# Patient Record
Sex: Female | Born: 1945 | Race: White | Hispanic: No | Smoking: Former smoker
Health system: Southern US, Community
[De-identification: ages and names within clinical notes are randomized; demographics above are authoritative.]

## PROBLEM LIST (undated history)

## (undated) DIAGNOSIS — M1990 Unspecified osteoarthritis, unspecified site: Secondary | ICD-10-CM

## (undated) DIAGNOSIS — I10 Essential (primary) hypertension: Secondary | ICD-10-CM

## (undated) DIAGNOSIS — K219 Gastro-esophageal reflux disease without esophagitis: Secondary | ICD-10-CM

## (undated) DIAGNOSIS — I219 Acute myocardial infarction, unspecified: Secondary | ICD-10-CM

## (undated) DIAGNOSIS — E785 Hyperlipidemia, unspecified: Secondary | ICD-10-CM

## (undated) DIAGNOSIS — M810 Age-related osteoporosis without current pathological fracture: Secondary | ICD-10-CM

---
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
## 1978-11-14 HISTORY — PX: TUBAL LIGATION: SHX77

## 2002-11-14 HISTORY — PX: CATARACT EXTRACTION: SUR2

## 2003-11-15 DIAGNOSIS — K219 Gastro-esophageal reflux disease without esophagitis: Secondary | ICD-10-CM

## 2003-11-15 HISTORY — DX: Gastro-esophageal reflux disease without esophagitis: K21.9

## 2016-02-01 DIAGNOSIS — I219 Acute myocardial infarction, unspecified: Secondary | ICD-10-CM

## 2016-02-01 HISTORY — DX: Acute myocardial infarction, unspecified: I21.9

## 2016-11-14 DIAGNOSIS — M1990 Unspecified osteoarthritis, unspecified site: Secondary | ICD-10-CM

## 2016-11-14 DIAGNOSIS — M810 Age-related osteoporosis without current pathological fracture: Secondary | ICD-10-CM

## 2016-11-14 HISTORY — DX: Unspecified osteoarthritis, unspecified site: M19.90

## 2016-11-14 HISTORY — DX: Age-related osteoporosis without current pathological fracture: M81.0

## 2019-01-02 ENCOUNTER — Other Ambulatory Visit: Payer: Self-pay | Admitting: Internal Medicine

## 2019-01-02 DIAGNOSIS — M810 Age-related osteoporosis without current pathological fracture: Secondary | ICD-10-CM

## 2019-02-25 ENCOUNTER — Other Ambulatory Visit: Payer: Self-pay

## 2019-04-30 ENCOUNTER — Ambulatory Visit: Payer: Medicare Other | Admitting: Physical Therapy

## 2019-05-02 ENCOUNTER — Other Ambulatory Visit: Payer: Self-pay

## 2019-05-02 ENCOUNTER — Encounter: Payer: Self-pay | Admitting: Physical Therapy

## 2019-05-02 ENCOUNTER — Ambulatory Visit: Payer: Medicare Other | Attending: Internal Medicine | Admitting: Physical Therapy

## 2019-05-02 ENCOUNTER — Ambulatory Visit: Payer: Medicare Other | Admitting: Physical Therapy

## 2019-05-02 DIAGNOSIS — M542 Cervicalgia: Secondary | ICD-10-CM

## 2019-05-02 DIAGNOSIS — R293 Abnormal posture: Secondary | ICD-10-CM | POA: Diagnosis present

## 2019-05-02 DIAGNOSIS — M62838 Other muscle spasm: Secondary | ICD-10-CM

## 2019-05-02 DIAGNOSIS — M6281 Muscle weakness (generalized): Secondary | ICD-10-CM
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
## 2019-05-07 ENCOUNTER — Other Ambulatory Visit: Payer: Self-pay

## 2019-05-07 ENCOUNTER — Ambulatory Visit: Payer: Medicare Other | Admitting: Physical Therapy

## 2019-05-07 ENCOUNTER — Encounter: Payer: Self-pay | Admitting: Physical Therapy

## 2019-05-07 DIAGNOSIS — M542 Cervicalgia: Secondary | ICD-10-CM | POA: Diagnosis not present

## 2019-05-07 DIAGNOSIS — R293 Abnormal posture: Secondary | ICD-10-CM

## 2019-05-07 DIAGNOSIS — M62838 Other muscle spasm: Secondary | ICD-10-CM

## 2019-05-07 DIAGNOSIS — M6281 Muscle weakness (generalized): Secondary | ICD-10-CM

## 2019-05-07 NOTE — Therapy (Signed)
[facility] [address], [place], [address] Phone: [phone]   Fax:  [phone]  Physical Therapy Treatment  Patient Details  Name: Anna Castaneda MRN: [id number] Date of Birth: 1946/09/27 Referring Provider (PT):  Barbette Reichmann, MD   Encounter Date: 05/07/2019  PT End of Session - 05/07/19 1531    Visit Number  2    Number of Visits  12    Date for PT Re-Evaluation  06/13/19    Authorization Type  Medicare authorization period from 05/02/2019    Authorization Time Period  Current Cert period: 05/02/2019 - 06/13/2019 (latest PN: IE 04/30/2019)    Authorization - Visit Number  2    Authorization - Number of Visits  10    PT Start Time  1433    PT Stop Time  1515    PT Time Calculation (min)  42 min    Activity Tolerance  Patient tolerated treatment well    Behavior During Therapy  [facility] for tasks assessed/performed       Past Medical History:  Diagnosis Date  . Arthritis 2018  . GERD (gastroesophageal reflux disease) 2005  . Myocardial infarction (HCC) 02/01/2016   Broken Heart Syndrome  . Osteoporosis 2018    Past Surgical History:  Procedure Laterality Date  . CATARACT EXTRACTION Bilateral 2004  . TONSILLECTOMY Bilateral 1954  . TUBAL LIGATION  1980    There were no vitals filed for this visit.  Subjective Assessment - 05/07/19 1527    Subjective  Patient states she is feeling well today and reports 2/10 pain at bilateral suboccipitial region upon arrival. She states she has been unable to see the chiropractor recently becaue the clinic is closed for a deep cleaning and she suspects there may have been someone there with COVID19 but this is unconfirmed. She states she saw her new cardiologist since last session who wants to do a stress test and echocardiogram since she is a new patient for him. She report she missed her last personal training session because she was not feeling well but she is  looking forward to going back tomorrow.    Pertinent History  Patient is a 73 y.o. female who presents to outpatient physical therapy with a referral for medical diagnosis neck pain and chronic low back pain. This patient's chief complaints consist of neck pain and thoracic spine pain, multi-joint instability and weaknes leading to the following functional deficits: difficulty sleeping, bending forward, reading, completing ADLs, and IADLs, and caring for a pet. 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    Limitations  Lifting;House hold activities;Other (comment);Sitting   difficulty sleeping, bending forward, reading, completing ADLs, and IADLs, and caring for a pet   Diagnostic tests  None except for many years ago possibly at chiropractic office. Does not remember anything significant.    Patient Stated Goals  get stronger and relieve pain    Currently in Pain?  Yes    Pain Score  2     Pain Location  Neck    Pain Orientation  Left;Right;Mid;Upper   bilateral suboccipitial region   Pain Descriptors / Indicators  Aching    Pain Type  Chronic pain    Pain Onset  More than a month ago  TREATMENT:  Denies history of spinal surgery, long term steroid use, or latex sensitivity.  Confirms Osteoporosis  Therapeutic exercise:to centralize symptoms and improve ROM, strength, muscular endurance, and activity tolerance required for successful completion of functional activities.  - supine chin tucks with towel roll placed behind neck for pt's comfort. X 20 with cuing to relax SCM and nod slightly to target deep neck flexors.  - Sidelying open book (thoracic rotation) to improve thoracic, shoulder girdle, and upper  trunk mobility. Required instruction for technique and cuing to achieve end range as tolerated, hold time, and breathing technique. 5 second holds. x20 each side. Pt reported she felt this may be better to do prior to manual and caused some stress on her neck. Neck was supported by towel roll and pillow.  - seated cervical retraction against yellow theraband x 10 to improve cervical spine strength. Patient reported she felt it stirred up her dysfunction and displaced her neck some. Reported no increased pain. Patient with difficulty retracting instead of extending cervical spine.  - seated retraction and flexion cervical spine stretch. 2x15 seconds plus additional time including with hands on cuing from clinician to achieve correct form. To improve upper cervical spine flexion and decrease muscle tension. Patient struggled to perform steady pressure in correct direction.  - Education on diagnosis, prognosis, POC, anatomy and physiology of current condition.     Manual therapy: to reduce pain and tissue tension, improve range of motion, neuromodulation, in order to promote improved ability to complete functional activities. - STM with trigger point relase to posterior cervical spine with focus on B SCM and suboccipital region with patient reporting significant relief during and following.  Gentle manual distraction intermittently throughout. Completed at start of session.   HOME EXERCISE PROGRAM HHome exercise Program [email or web address] Created by Norton BlizzardSara Jathniel Smeltzer, PT, DPT Jun 23rd, 2020 View videos at [email or web address] Sustained cervical flexion/retraction in sitting Maintain the neck retracted with the web-space of one hand while maintaining neck bend with the other hand. Maintain the position for 15-30 seconds at a time. Repeat 3 Times Hold 30 Seconds Complete 1 Set Perform 2 Times a Day Chin Tuck Roll up a towel and place it below your neck. Your ear should be in line with your shoulder, so you may place a  pillow under your head if needed. Without lifting your head, or pushing your head back against the bed, nod your head, and pull your chin down, as if nodding "yes". Relax and return to the starting position. This is a subtle motion, so be sure not to strain your neck muscles. Repeat 10 Times Hold 5 Seconds Complete 3 Sets Perform 2 Times a Day    PT Education - 05/07/19 1532    Education Details  Exercise purpose/form. Self management techniques. Education on diagnosis, prognosis, POC, anatomy and physiology of current condition Education on HEP    Person(s) Educated  Patient    Methods  Explanation;Demonstration;Tactile cues;Verbal cues;Handout    Comprehension  Verbalized understanding;Returned demonstration;Verbal cues required;Tactile cues required       PT Short Term Goals - 05/02/19 1906      PT SHORT TERM GOAL #1   Title  Be independent with initial home exercise program for self-management of symptoms.    Baseline  Initial HEP provided at IE (05/02/2019):    Time  2    Period  Weeks    Status  New    Target Date  05/16/19        PT  Long Term Goals - 05/02/19 1907      PT LONG TERM GOAL #1   Title  Be independent with a long-term home exercise program for self-management of symptoms.    Baseline  Initial HEP provided at initial eval (05/02/2019);    Time  6    Period  Weeks    Status  New    Target Date  06/13/19      PT LONG TERM GOAL #2   Title  Reduce pain with functional activities to equal or less than 1/10 to allow patient to complete usual activities including ADLs, IADLs, and social engagement with less difficulty.    Baseline  4-5/10 (05/02/2019);    Time  6    Period  Weeks    Status  New    Target Date  06/13/19      PT LONG TERM GOAL #3   Title  Have full cervical spine AROM with no compensations or increase in pain in all planes except intermittent end range discomfort to allow patient to complete valued activities with less difficulty.     Baseline  see objective exam (05/02/2019);    Time  6    Period  Weeks    Status  New    Target Date  06/13/19      PT LONG TERM GOAL #4   Title  Complete community, work and/or recreational activities without limitation due to current condition.    Baseline  difficulty sleeping, bending forward, reading, completing ADLs, and IADLs, gardening, volunteering, and caring for a pet (05/02/2019);    Time  6    Period  Weeks    Status  New    Target Date  06/13/19      PT LONG TERM GOAL #5   Title  Patient will demonstrate improved ability to perform functional tasks as exhibited by at least 10 point improvement in FOTO score    Baseline  to be measured visit 2 (05/02/2019);    Time  6    Period  Weeks    Status  New    Target Date  06/13/19            Plan - 05/07/19 1531    Clinical Impression Statement  Pt tolerated treatment well overall. She felt some temporary relief with manual therapy. She demonstrated poor carryover in HEP technique fron last visit and was re-educated on form for chin tucks. Noted to have muscular shortening in many posterior cervical muscles. Was ameniable to removing towel roll behind neck at times.  Pt was able to complete all exercises with minimal to no lasting increase in pain but stated she felt her neck was "out" at end of session. Reports reduction of suboccipital pain from 2 to 1 but had onset of pain at bilateral proximal attachment of SCM by end of session. Patient seemed satisfied with session but stated she would not know how she responded to it until later in the day. She repeatedly suggested exercises may be better prior to manual after she started performing exercises. Pt required multimodal cuing for proper technique and to facilitate improved neuromuscular control, strength, range of motion, and functional ability resulting in improved performance and form. Patient would benefit from continued physical therapy to address remaining impairments and  functional limitations to work towards stated goals and return to PLOF or maximal functional independence.    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   Examination-Participation Restrictions  Interpersonal Relationship;Driving;Yard Work;Cleaning;Volunteer;Laundry;Church;Community Activity    Stability/Clinical Decision Making  Evolving/Moderate complexity    Rehab Potential  Good    PT Frequency  2x / week    PT Duration  6 weeks    PT Treatment/Interventions  ADLs/Self Care Home Management;Cryotherapy;Electrical Stimulation;Moist Heat;Therapeutic activities;Therapeutic exercise;Neuromuscular re-education;Patient/family education;Manual techniques;Dry needling;Passive range of motion;Joint Manipulations;Other (comment);Spinal Manipulations   joint mobilizations grades I-IV   PT Next Visit Plan  manual therapy, stretches for upper cervical spine, deep cervical neck flexor strengtheing    PT Home Exercise Plan  - chin tucks for deep cervical spine strengthening. - cervical retraction/flexion stretch    Consulted and Agree with Plan of Care  Patient       Patient will benefit from skilled therapeutic intervention in order to improve the following deficits and impairments:  Hypomobility, Increased muscle spasms, Impaired perceived functional ability, Improper body mechanics, Postural dysfunction, Impaired flexibility, Increased fascial  restricitons, Decreased strength, Decreased coordination, Decreased activity tolerance, Decreased range of motion, Pain  Visit Diagnosis: 1. Cervicalgia   2. Abnormal posture   3. Other muscle spasm   4. Muscle weakness (generalized)        Problem List There are no active problems to display for this patient.   Everlean Alstrom. Graylon Good, PT, DPT 05/07/19, 3:34 PM  [facility] [address] [place], [address] Phone: [phone]   Fax:  [phone]  Name: Anna Castaneda MRN: [id number] Date of Birth: January 26, 1946

## 2019-05-09 ENCOUNTER — Ambulatory Visit: Payer: Medicare Other | Admitting: Physical Therapy

## 2019-05-14 ENCOUNTER — Ambulatory Visit: Payer: Medicare Other | Admitting: Physical Therapy

## 2019-05-14 ENCOUNTER — Other Ambulatory Visit: Payer: Self-pay | Admitting: Obstetrics and Gynecology

## 2019-05-14 ENCOUNTER — Other Ambulatory Visit: Payer: Self-pay

## 2019-05-14 DIAGNOSIS — Z1231 Encounter for screening mammogram for malignant neoplasm of breast: Secondary | ICD-10-CM

## 2019-05-14 NOTE — Therapy (Signed)
[facility] [address] [place], [address] Phone: [phone]   Fax:  [phone]  Physical Therapy Treatment  Patient Details  Name: Anna Castaneda MRN: [id number] Date of Birth: 11-30-1945 Referring Provider (PT):  Tracie Harrier, MD   Encounter Date: 05/14/2019    Past Medical History:  Diagnosis Date  . Arthritis 2018  . GERD (gastroesophageal reflux disease) 2005  . Myocardial infarction ([place]) 02/01/2016   Broken Heart Syndrome  . Osteoporosis 2018    Past Surgical History:  Procedure Laterality Date  . CATARACT EXTRACTION Bilateral 2004  . TONSILLECTOMY Bilateral 1954  . TUBAL LIGATION  1980    There were no vitals filed for this visit.    Patient was arrived in system but not seen today.                           PT Short Term Goals - 05/02/19 1906      PT SHORT TERM GOAL #1   Title  Be independent with initial home exercise program for self-management of symptoms.    Baseline  Initial HEP provided at IE (05/02/2019):    Time  2    Period  Weeks    Status  New    Target Date  05/16/19        PT Long Term Goals - 05/02/19 1907      PT LONG TERM GOAL #1   Title  Be independent with a long-term home exercise program for self-management of symptoms.    Baseline  Initial HEP provided at initial eval (05/02/2019);    Time  6    Period  Weeks    Status  New    Target Date  06/13/19      PT LONG TERM GOAL #2   Title  Reduce pain with functional activities to equal or less than 1/10 to allow patient to complete usual activities including ADLs, IADLs, and social engagement with less difficulty.    Baseline  4-5/10 (05/02/2019);    Time  6    Period  Weeks    Status  New    Target Date  06/13/19      PT LONG TERM GOAL #3   Title  Have full cervical spine AROM with no compensations or increase in pain in all planes except intermittent end range discomfort  to allow patient to complete valued activities with less difficulty.    Baseline  see objective exam (05/02/2019);    Time  6    Period  Weeks    Status  New    Target Date  06/13/19      PT LONG TERM GOAL #4   Title  Complete community, work and/or recreational activities without limitation due to current condition.    Baseline  difficulty sleeping, bending forward, reading, completing ADLs, and IADLs, gardening, volunteering, and caring for a pet (05/02/2019);    Time  6    Period  Weeks    Status  New    Target Date  06/13/19      PT LONG TERM GOAL #5   Title  Patient will demonstrate improved ability to perform functional tasks as exhibited by at least 10 point improvement in FOTO score    Baseline  to be measured visit 2 (05/02/2019);    Time  6    Period  Weeks    Status  New  Target Date  06/13/19              Patient will benefit from skilled therapeutic intervention in order to improve the following deficits and impairments:     Visit Diagnosis: No diagnosis found.     Problem List There are no active problems to display for this patient.   Everett Ricciardelli 05/14/2019, 3:06 PM  [facility] [address] [place], [address] Phone: [phone]   Fax:  [phone]  Name: Anna Castaneda MRN: [id number] Date of Birth: [birth year]

## 2019-05-15 ENCOUNTER — Encounter: Payer: Self-pay | Admitting: Physical Therapy

## 2019-05-16 ENCOUNTER — Ambulatory Visit: Payer: Medicare Other | Admitting: Physical Therapy

## 2019-05-21 ENCOUNTER — Emergency Department (HOSPITAL_COMMUNITY): Payer: Medicare Other

## 2019-05-21 ENCOUNTER — Inpatient Hospital Stay (HOSPITAL_COMMUNITY)
Admission: EM | Admit: 2019-05-21 | Discharge: 2019-05-24 | DRG: 100 | Disposition: A | Discharge status: Skilled Nursing Facility | Payer: Medicare Other | Attending: Neurology | Admitting: Neurology

## 2019-05-21 ENCOUNTER — Other Ambulatory Visit: Payer: Self-pay

## 2019-05-21 ENCOUNTER — Ambulatory Visit: Payer: Medicare Other | Admitting: Physical Therapy

## 2019-05-21 ENCOUNTER — Inpatient Hospital Stay (HOSPITAL_COMMUNITY): Payer: Medicare Other

## 2019-05-21 DIAGNOSIS — R29810 Facial weakness: Secondary | ICD-10-CM | POA: Diagnosis present

## 2019-05-21 DIAGNOSIS — R569 Unspecified convulsions: Secondary | ICD-10-CM

## 2019-05-21 DIAGNOSIS — R29705 NIHSS score 5: Secondary | ICD-10-CM | POA: Diagnosis present

## 2019-05-21 DIAGNOSIS — K5900 Constipation, unspecified: Secondary | ICD-10-CM | POA: Diagnosis present

## 2019-05-21 DIAGNOSIS — S7001XA Contusion of right hip, initial encounter: Secondary | ICD-10-CM | POA: Diagnosis present

## 2019-05-21 DIAGNOSIS — I639 Cerebral infarction, unspecified: Secondary | ICD-10-CM | POA: Diagnosis not present

## 2019-05-21 DIAGNOSIS — K219 Gastro-esophageal reflux disease without esophagitis: Secondary | ICD-10-CM | POA: Diagnosis present

## 2019-05-21 DIAGNOSIS — W1830XA Fall on same level, unspecified, initial encounter: Secondary | ICD-10-CM | POA: Diagnosis present

## 2019-05-21 DIAGNOSIS — W101XXA Fall (on)(from) sidewalk curb, initial encounter: Secondary | ICD-10-CM

## 2019-05-21 DIAGNOSIS — G9341 Metabolic encephalopathy: Secondary | ICD-10-CM | POA: Diagnosis present

## 2019-05-21 DIAGNOSIS — W19XXXA Unspecified fall, initial encounter: Secondary | ICD-10-CM | POA: Diagnosis present

## 2019-05-21 DIAGNOSIS — E876 Hypokalemia: Secondary | ICD-10-CM | POA: Diagnosis present

## 2019-05-21 DIAGNOSIS — I429 Cardiomyopathy, unspecified: Secondary | ICD-10-CM | POA: Diagnosis present

## 2019-05-21 DIAGNOSIS — Z9842 Cataract extraction status, left eye: Secondary | ICD-10-CM

## 2019-05-21 DIAGNOSIS — E785 Hyperlipidemia, unspecified: Secondary | ICD-10-CM | POA: Diagnosis present

## 2019-05-21 DIAGNOSIS — S50312A Abrasion of left elbow, initial encounter: Secondary | ICD-10-CM | POA: Diagnosis present

## 2019-05-21 DIAGNOSIS — E878 Other disorders of electrolyte and fluid balance, not elsewhere classified: Secondary | ICD-10-CM | POA: Diagnosis present

## 2019-05-21 DIAGNOSIS — E871 Hypo-osmolality and hyponatremia: Secondary | ICD-10-CM | POA: Diagnosis present

## 2019-05-21 DIAGNOSIS — E861 Hypovolemia: Secondary | ICD-10-CM | POA: Diagnosis present

## 2019-05-21 DIAGNOSIS — D62 Acute posthemorrhagic anemia: Secondary | ICD-10-CM | POA: Diagnosis present

## 2019-05-21 DIAGNOSIS — Z9841 Cataract extraction status, right eye: Secondary | ICD-10-CM

## 2019-05-21 DIAGNOSIS — R578 Other shock: Secondary | ICD-10-CM | POA: Diagnosis not present

## 2019-05-21 DIAGNOSIS — Z87891 Personal history of nicotine dependence: Secondary | ICD-10-CM | POA: Diagnosis not present

## 2019-05-21 DIAGNOSIS — I6300 Cerebral infarction due to thrombosis of unspecified precerebral artery: Secondary | ICD-10-CM | POA: Diagnosis not present

## 2019-05-21 DIAGNOSIS — M810 Age-related osteoporosis without current pathological fracture: Secondary | ICD-10-CM | POA: Diagnosis present

## 2019-05-21 DIAGNOSIS — I351 Nonrheumatic aortic (valve) insufficiency: Secondary | ICD-10-CM | POA: Diagnosis not present

## 2019-05-21 DIAGNOSIS — I5181 Takotsubo syndrome: Secondary | ICD-10-CM | POA: Diagnosis present

## 2019-05-21 DIAGNOSIS — I2510 Atherosclerotic heart disease of native coronary artery without angina pectoris: Secondary | ICD-10-CM | POA: Diagnosis present

## 2019-05-21 DIAGNOSIS — I252 Old myocardial infarction: Secondary | ICD-10-CM

## 2019-05-21 DIAGNOSIS — S50311A Abrasion of right elbow, initial encounter: Secondary | ICD-10-CM | POA: Diagnosis present

## 2019-05-21 DIAGNOSIS — I10 Essential (primary) hypertension: Secondary | ICD-10-CM | POA: Diagnosis present

## 2019-05-21 DIAGNOSIS — S7011XS Contusion of right thigh, sequela: Secondary | ICD-10-CM

## 2019-05-21 DIAGNOSIS — S7012XS Contusion of left thigh, sequela: Secondary | ICD-10-CM

## 2019-05-21 DIAGNOSIS — Z1159 Encounter for screening for other viral diseases: Secondary | ICD-10-CM

## 2019-05-21 DIAGNOSIS — S8001XA Contusion of right knee, initial encounter: Secondary | ICD-10-CM | POA: Diagnosis present

## 2019-05-21 DIAGNOSIS — Z79899 Other long term (current) drug therapy: Secondary | ICD-10-CM

## 2019-05-21 DIAGNOSIS — Z7982 Long term (current) use of aspirin: Secondary | ICD-10-CM

## 2019-05-21 DIAGNOSIS — R2990 Unspecified symptoms and signs involving the nervous system: Secondary | ICD-10-CM | POA: Diagnosis present

## 2019-05-21 LAB — DIFFERENTIAL
Abs Immature Granulocytes: 0.05 10*3/uL (ref 0.00–0.07)
Basophils Absolute: 0 10*3/uL (ref 0.0–0.1)
Basophils Relative: 0 %
Eosinophils Absolute: 0 10*3/uL (ref 0.0–0.5)
Eosinophils Relative: 0 %
Immature Granulocytes: 1 %
Lymphocytes Relative: 10 %
Lymphs Abs: 0.9 10*3/uL (ref 0.7–4.0)
Monocytes Absolute: 0.7 10*3/uL (ref 0.1–1.0)
Monocytes Relative: 8 %
Neutro Abs: 7.2 10*3/uL (ref 1.7–7.7)
Neutrophils Relative %: 81 %

## 2019-05-21 LAB — COMPREHENSIVE METABOLIC PANEL
ALT: 24 U/L (ref 0–44)
ALT: 21 U/L (ref 0–44)
AST: 35 U/L (ref 15–41)
AST: 29 U/L (ref 15–41)
Albumin: 4.4 g/dL (ref 3.5–5.0)
Albumin: 3.5 g/dL (ref 3.5–5.0)
Alkaline Phosphatase: 48 U/L (ref 38–126)
Alkaline Phosphatase: 38 U/L (ref 38–126)
Anion gap: 14 (ref 5–15)
Anion gap: 10 (ref 5–15)
BUN: 7 mg/dL — ABNORMAL LOW (ref 8–23)
BUN: 6 mg/dL — ABNORMAL LOW (ref 8–23)
CO2: 18 mmol/L — ABNORMAL LOW (ref 22–32)
CO2: 19 mmol/L — ABNORMAL LOW (ref 22–32)
Calcium: 8.1 mg/dL — ABNORMAL LOW (ref 8.9–10.3)
Calcium: 7.4 mg/dL — ABNORMAL LOW (ref 8.9–10.3)
Chloride: 76 mmol/L — ABNORMAL LOW (ref 98–111)
Chloride: 83 mmol/L — ABNORMAL LOW (ref 98–111)
Creatinine, Ser: 0.62 mg/dL (ref 0.44–1.00)
Creatinine, Ser: 0.56 mg/dL (ref 0.44–1.00)
GFR calc Af Amer: >60 mL/min (ref >60)
GFR calc Af Amer: >60 mL/min (ref >60)
GFR calc non Af Amer: >60 mL/min (ref >60)
GFR calc non Af Amer: >60 mL/min (ref >60)
Glucose, Bld: 163 mg/dL — ABNORMAL HIGH (ref 70–99)
Glucose, Bld: 137 mg/dL — ABNORMAL HIGH (ref 70–99)
Potassium: 4.2 mmol/L (ref 3.5–5.1)
Potassium: 3.3 mmol/L — ABNORMAL LOW (ref 3.5–5.1)
Sodium: 108 mmol/L — CL (ref 135–145)
Sodium: 112 mmol/L — CL (ref 135–145)
Total Bilirubin: 1.2 mg/dL (ref 0.3–1.2)
Total Bilirubin: 1.2 mg/dL (ref 0.3–1.2)
Total Protein: 6.9 g/dL (ref 6.5–8.1)
Total Protein: 5.5 g/dL — ABNORMAL LOW (ref 6.5–8.1)

## 2019-05-21 LAB — BASIC METABOLIC PANEL
Anion gap: 10 (ref 5–15)
BUN: 6 mg/dL — ABNORMAL LOW (ref 8–23)
CO2: 18 mmol/L — ABNORMAL LOW (ref 22–32)
Calcium: 7.4 mg/dL — ABNORMAL LOW (ref 8.9–10.3)
Chloride: 86 mmol/L — ABNORMAL LOW (ref 98–111)
Creatinine, Ser: 0.58 mg/dL (ref 0.44–1.00)
GFR calc Af Amer: >60 mL/min (ref >60)
GFR calc non Af Amer: >60 mL/min (ref >60)
Glucose, Bld: 121 mg/dL — ABNORMAL HIGH (ref 70–99)
Potassium: 3.7 mmol/L (ref 3.5–5.1)
Sodium: 114 mmol/L — CL (ref 135–145)

## 2019-05-21 LAB — PROTIME-INR
INR: 1.1 (ref 0.8–1.2)
Prothrombin Time: 14.4 seconds (ref 11.4–15.2)

## 2019-05-21 LAB — I-STAT CHEM 8, ED
BUN: 6 mg/dL — ABNORMAL LOW (ref 8–23)
Calcium, Ion: 0.88 mmol/L — CL (ref 1.15–1.40)
Chloride: 75 mmol/L — ABNORMAL LOW (ref 98–111)
Creatinine, Ser: 0.5 mg/dL (ref 0.44–1.00)
Glucose, Bld: 164 mg/dL — ABNORMAL HIGH (ref 70–99)
HCT: 42 % (ref 36.0–46.0)
Hemoglobin: 14.3 g/dL (ref 12.0–15.0)
Potassium: 4.2 mmol/L (ref 3.5–5.1)
Sodium: 104 mmol/L — CL (ref 135–145)
TCO2: 18 mmol/L — ABNORMAL LOW (ref 22–32)

## 2019-05-21 LAB — CBC
HCT: 36.4 % (ref 36.0–46.0)
Hemoglobin: 13.2 g/dL (ref 12.0–15.0)
MCH: 32 pg (ref 26.0–34.0)
MCHC: 36.3 g/dL — ABNORMAL HIGH (ref 30.0–36.0)
MCV: 88.1 fL (ref 80.0–100.0)
Platelets: 375 10*3/uL (ref 150–400)
RBC: 4.13 MIL/uL (ref 3.87–5.11)
RDW: 11.9 % (ref 11.5–15.5)
WBC: 8.8 10*3/uL (ref 4.0–10.5)
nRBC: 0 % (ref 0.0–0.2)

## 2019-05-21 LAB — CBG MONITORING, ED: Glucose-Capillary: 169 mg/dL — ABNORMAL HIGH (ref 70–99)

## 2019-05-21 LAB — ABO/RH: ABO/RH(D): AB POS

## 2019-05-21 LAB — SARS CORONAVIRUS 2 BY RT PCR (HOSPITAL ORDER, PERFORMED IN ~~LOC~~ HOSPITAL LAB): SARS Coronavirus 2: NEGATIVE

## 2019-05-21 LAB — HEMOGLOBIN AND HEMATOCRIT, BLOOD
HCT: 30.9 % — ABNORMAL LOW (ref 36.0–46.0)
Hemoglobin: 11.6 g/dL — ABNORMAL LOW (ref 12.0–15.0)

## 2019-05-21 LAB — APTT: aPTT: 29 seconds (ref 24–36)

## 2019-05-21 LAB — TSH: TSH: 1.368 u[IU]/mL (ref 0.350–4.500)

## 2019-05-21 LAB — MAGNESIUM: Magnesium: 2.2 mg/dL (ref 1.7–2.4)

## 2019-05-21 LAB — PREPARE RBC (CROSSMATCH)

## 2019-05-21 LAB — FIBRINOGEN: Fibrinogen: 206 mg/dL — ABNORMAL LOW (ref 210–475)

## 2019-05-21 MED ORDER — TRANEXAMIC ACID-NACL 1000-0.7 MG/100ML-% IV SOLN
1000.0000 mg | Freq: Once | INTRAVENOUS | Status: AC
  Administered 2019-05-21: 1000 mg via INTRAVENOUS
  Filled 2019-05-21: qty 100

## 2019-05-21 MED ORDER — TRANEXAMIC ACID-NACL 1000-0.7 MG/100ML-% IV SOLN
1000.0000 mg | INTRAVENOUS | Status: DC
  Filled 2019-05-21: qty 100

## 2019-05-21 MED ORDER — LABETALOL HCL 5 MG/ML IV SOLN: 20.0000 mg | Freq: Once | INTRAVENOUS | Status: DC

## 2019-05-21 MED ORDER — STROKE: EARLY STAGES OF RECOVERY BOOK
Freq: Once | Status: DC
  Filled 2019-05-21: qty 1

## 2019-05-21 MED ORDER — CLEVIDIPINE BUTYRATE 0.5 MG/ML IV EMUL: 0.0000 mg/h | INTRAVENOUS | Status: DC

## 2019-05-21 MED ORDER — ALTEPLASE (STROKE) FULL DOSE INFUSION
0.9000 mg/kg | Freq: Once | INTRAVENOUS | Status: AC
  Administered 2019-05-21: 50.2 mg via INTRAVENOUS
  Filled 2019-05-21: qty 50.2

## 2019-05-21 MED ORDER — ACETAMINOPHEN 325 MG PO TABS: 650.0000 mg | ORAL_TABLET | ORAL | Status: DC | PRN

## 2019-05-21 MED ORDER — SODIUM CHLORIDE 0.9 % IV SOLN: Freq: Once | INTRAVENOUS | Status: DC

## 2019-05-21 MED ORDER — SODIUM CHLORIDE 0.9 % IV SOLN
INTRAVENOUS | Status: DC
  Administered 2019-05-21: 16:00:00 via INTRAVENOUS

## 2019-05-21 MED ORDER — SODIUM CHLORIDE 0.9 % IV BOLUS
1000.0000 mL | Freq: Once | INTRAVENOUS | Status: AC
  Administered 2019-05-21: 1000 mL via INTRAVENOUS

## 2019-05-21 MED ORDER — ACETAMINOPHEN 650 MG RE SUPP: 650.0000 mg | RECTAL | Status: DC | PRN

## 2019-05-21 MED ORDER — ACETAMINOPHEN 160 MG/5ML PO SOLN: 650.0000 mg | ORAL | Status: DC | PRN

## 2019-05-21 MED ORDER — PANTOPRAZOLE SODIUM 40 MG IV SOLR
40.0000 mg | Freq: Every day | INTRAVENOUS | Status: DC
  Administered 2019-05-22: 40 mg via INTRAVENOUS
  Filled 2019-05-21: qty 40

## 2019-05-21 MED ORDER — SENNOSIDES-DOCUSATE SODIUM 8.6-50 MG PO TABS: 1.0000 | ORAL_TABLET | Freq: Every evening | ORAL | Status: DC | PRN

## 2019-05-21 MED ORDER — HYDRALAZINE HCL 20 MG/ML IJ SOLN: 20.0000 mg | Freq: Three times a day (TID) | INTRAMUSCULAR | Status: DC | PRN

## 2019-05-21 MED ORDER — SODIUM CHLORIDE 0.9% IV SOLUTION: Freq: Once | INTRAVENOUS | Status: DC

## 2019-05-21 MED ORDER — IOHEXOL 350 MG/ML SOLN
100.0000 mL | Freq: Once | INTRAVENOUS | Status: AC | PRN
  Administered 2019-05-21: 75 mL via INTRAVENOUS

## 2019-05-21 MED ORDER — SODIUM CHLORIDE 0.9 % IV SOLN
50.0000 mL | Freq: Once | INTRAVENOUS | Status: AC
  Administered 2019-05-21: 50 mL via INTRAVENOUS

## 2019-05-21 NOTE — Code Documentation (Signed)
73 yo female coming from a [facility] facility with hx of arthritis, MI, and GERD. Pt was LKW at 1355 when she talked with staff. Pt was shortly after found down at the mailbox where she was noted to have right sided weakness and trouble speaking. [facility] called and activated a Code Stroke. Stroke Team met patient upon arrival NIHSS 4 due to aphasia, right sided facial droop, and dysarthria. RIght Sided weakness had resolved upon patient's arrival. CT Head negative for hemorrhage. Patient tPA candidate and tPA given. 45.2 mg given over 60 minutes with 5 mg bolus given over one minute. 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 Upon reassessment, pt has NIHSS 1 due to mild aphasia. Pt is not IR candidate due to low NIHSS. See Stroke Timeline and NIHSS Flowsheet for details. Pt brought back to ED and placed on cardiac monitor. To be admitted to [facility]. Handoff given to Sarah,RN.

## 2019-05-21 NOTE — Progress Notes (Signed)
CRITICAL VALUE ALERT  Critical Value: Na+ 114  Date & Time Notied: 2305 05/21/19   Provider Notified: Warren Lacy   Orders Received/Actions taken: Change rate of NS from 75 mL to 30 mL

## 2019-05-21 NOTE — Progress Notes (Addendum)
Was called around 7 PM by neuro ICU nurse that patient has large hematoma over her right thigh as well as a small hematoma behind her right knee.  Upon assessment patient was hypotensive with blood pressures as low as 79X systolic.  Large hematoma that was expanding in size per the nurse.    On examination, patient is awake alert and oriented.  TPA reversal order set initiated immediately.  TXA was administered.  Follow-up fibrinogen level borderline low 206 (reference range 2 10-4 75). Patient is hemodynamically stable and size of hematoma has not increased after administration of TXA.  Will defer cryoprecipitate transfusion.    Will monitor H&H dropped from 14-11.  CCM called as patient initially hypotensive, appreciate their assistance.   I spent 35 minutes of critical care time in this neurologically ill patient.  Patient is high risk for deterioration from complications of TPA, and patient high risk of going into hypovolemic shock.  I spoke with patient's friend who is the medical advocate.  Discussed goals of care, she is a full code.  Patient also has a niece, was not able to get in touch with her last night.

## 2019-05-21 NOTE — ED Triage Notes (Signed)
Pt here from independent living facility where she was found down at her mailbox. Was seen normal 15 mins prior to being found. Pt has expressive and receptive aphasia and slight facial droop.
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
## 2019-05-21 NOTE — Progress Notes (Signed)
[facility] Progress Note Patient Name: Anna Castaneda DOB: [birth year] MRN: [id number]   Date of Service  05/21/2019  HPI/Events of Note  Hyponatremia - Na+ = 108 --> 114. In just under 8 hours.   eICU Interventions  Will order: 1. Decrease 0.9 NaCl IV infusion to 30 mL/hour.      Intervention Category Major Interventions: Electrolyte abnormality - evaluation and management  [name] Eugene 05/21/2019, 11:15 PM

## 2019-05-21 NOTE — Progress Notes (Signed)
Pharmacist Code Stroke Response  Notified to mix tPA at 1450 by Dr. Leonie Man Delivered tPA to RN at 1455  tPA dose = 5mg  bolus over 1 minute followed by 45.2mg  for a total dose of 50.2mg  over 1 hour  Issues/delays encountered (if applicable):None   Corinda Gubler 05/21/19 3:07 PM

## 2019-05-21 NOTE — Progress Notes (Signed)
Upon initial assessment at presentation to unit, patient presented with large fist size hemaotoma on outer right hip and golf ball size on right outer knee.  MD notified. Hemotoma's notably larger within minutes. MD at bedside and upon assessment- TPX ordered for reversal and administered. Follow up Fibrinogen shows stable levels, hematomas stable, HCT stable.  RN will continue to monitor.   CRITICAL VALUE ALERT  Critical Value:  Na+ 112  Date & Time Notied:  05/21/19 at 2100.   Provider Notified: Georgann Housekeeper, NP.   Orders Received/Actions taken: Redraw timed Na+ levels q2 hours.

## 2019-05-21 NOTE — ED Notes (Signed)
ED TO INPATIENT HANDOFF REPORT  ED Nurse Name and Phone #: Maralyn SagoSarah [phone]  S Name/Age/Gender Anna PottJanice Castaneda 73 y.o. female Room/Bed: 032C/032C  Code Status   Code Status: Full Code  Home/SNF/Other independant living facility Patient oriented to: self, place, time and situation Is this baseline? Yes   Triage Complete: Triage complete  Chief Complaint CODE STROKE  Triage Note Pt here from independent living facility where she was found down at her mailbox. Was seen normal 15 mins prior to being found. Pt has expressive and receptive aphasia and slight facial droop.    Allergies Not on File  Level of Care/Admitting Diagnosis ED Disposition    ED Disposition Condition Comment   Admit  [facility] [[id number]]  Level of Care: ICU [6]  Covid Evaluation: N/A  Diagnosis: Stroke [facility](HCC) [[id number]]) [[id number]]  Admitting Physician: Gabriel RungSTROKE, MD [place][[id number]]  Attending Physician: STROKE, MD [[id number]]  Estimated length of stay: 3 - 4 days  Certification:: I certify there are rare and unusual circumstances requiring inpatient admission  PT Class (Do Not Modify): Inpatient [101]  PT Acc Code (Do Not Modify): Private [1]       B Medical/Surgery History Past Medical History:  Diagnosis Date  . Arthritis 2018  . GERD (gastroesophageal reflux disease) 2005  . Myocardial infarction (HCC) 02/01/2016   Broken Heart Syndrome  . Osteoporosis 2018   Past Surgical History:  Procedure Laterality Date  . CATARACT EXTRACTION Bilateral 2004  . TONSILLECTOMY Bilateral 1954  . TUBAL LIGATION  1980     A IV Location/Drains/Wounds Patient Lines/Drains/Airways Status   Active Line/Drains/Airways    Name:   Placement date:   Placement time:   Site:   Days:   Peripheral IV 05/21/19 Right;Upper Forearm   05/21/19    1505    Forearm   less than 1   Peripheral IV 05/21/19 Left Antecubital   05/21/19    1505    Antecubital   less than 1          Intake/Output Last 24  hours  Intake/Output Summary (Last 24 hours) at 05/21/2019 1820 Last data filed at 05/21/2019 1721 Gross per 24 hour  Intake 118.39 ml  Output -  Net 118.39 ml    Labs/Imaging Results for orders placed or performed during the hospital encounter of 05/21/19 (from the past 48 hour(s))  Protime-INR     Status: None   Collection Time: 05/21/19  2:40 PM  Result Value Ref Range   Prothrombin Time 14.4 11.4 - 15.2 seconds   INR 1.1 0.8 - 1.2    Comment: (NOTE) INR goal varies based on device and disease states. Performed at [facility], [address] [place], [place] [address]   APTT     Status: None   Collection Time: 05/21/19  2:40 PM  Result Value Ref Range   aPTT 29 24 - 36 seconds    Comment: Performed at [facility], [address] [place], [place] [address]  CBC     Status: Abnormal   Collection Time: 05/21/19  2:40 PM  Result Value Ref Range   WBC 8.8 4.0 - 10.5 K/uL   RBC 4.13 3.87 - 5.11 MIL/uL   Hemoglobin 13.2 12.0 - 15.0 g/dL   HCT 86.536.4 78.436.0 - 69.646.0 %   MCV 88.1 80.0 - 100.0 fL   MCH 32.0 26.0 - 34.0 pg   MCHC 36.3 (H) 30.0 - 36.0 g/dL   RDW 29.511.9 28.411.5 -  15.5 %   Platelets 375 150 - 400 K/uL   nRBC 0.0 0.0 - 0.2 %    Comment: Performed at [facility], [address] [place], [place] [address]  Differential     Status: None   Collection Time: 05/21/19  2:40 PM  Result Value Ref Range   Neutrophils Relative % 81 %   Neutro Abs 7.2 1.7 - 7.7 K/uL   Lymphocytes Relative 10 %   Lymphs Abs 0.9 0.7 - 4.0 K/uL   Monocytes Relative 8 %   Monocytes Absolute 0.7 0.1 - 1.0 K/uL   Eosinophils Relative 0 %   Eosinophils Absolute 0.0 0.0 - 0.5 K/uL   Basophils Relative 0 %   Basophils Absolute 0.0 0.0 - 0.1 K/uL   Immature Granulocytes 1 %   Abs Immature Granulocytes 0.05 0.00 - 0.07 K/uL    Comment: Performed at [facility], [address] [place], [place] [address]  Comprehensive metabolic panel     Status: Abnormal   Collection Time:  05/21/19  2:40 PM  Result Value Ref Range   Sodium 108 (LL) 135 - 145 mmol/L    Comment: REPEATED TO VERIFY CRITICAL RESULT CALLED TO, READ BACK BY AND VERIFIED WITHJerilee Hoh: S BETRAN D,RN 1526 05/21/2019 WBOND    Potassium 4.2 3.5 - 5.1 mmol/L   Chloride 76 (L) 98 - 111 mmol/L   CO2 18 (L) 22 - 32 mmol/L   Glucose, Bld 163 (H) 70 - 99 mg/dL   BUN 7 (L) 8 - 23 mg/dL   Creatinine, Ser 1.910.62 0.44 - 1.00 mg/dL   Calcium 8.1 (L) 8.9 - 10.3 mg/dL   Total Protein 6.9 6.5 - 8.1 g/dL   Albumin 4.4 3.5 - 5.0 g/dL   AST 35 15 - 41 U/L   ALT 24 0 - 44 U/L   Alkaline Phosphatase 48 38 - 126 U/L   Total Bilirubin 1.2 0.3 - 1.2 mg/dL   GFR calc non Af Amer >60 >60 mL/min   GFR calc Af Amer >60 >60 mL/min   Anion gap 14 5 - 15    Comment: Performed at [facility], [address] [place], [place] [address]  CBG monitoring, ED     Status: Abnormal   Collection Time: 05/21/19  2:43 PM  Result Value Ref Range   Glucose-Capillary 169 (H) 70 - 99 mg/dL  I-stat chem 8, ED     Status: Abnormal   Collection Time: 05/21/19  2:45 PM  Result Value Ref Range   Sodium 104 (LL) 135 - 145 mmol/L   Potassium 4.2 3.5 - 5.1 mmol/L   Chloride 75 (L) 98 - 111 mmol/L   BUN 6 (L) 8 - 23 mg/dL   Creatinine, Ser 5.620.50 0.44 - 1.00 mg/dL   Glucose, Bld 130164 (H) 70 - 99 mg/dL   Calcium, Ion 8.650.88 (LL) 1.15 - 1.40 mmol/L   TCO2 18 (L) 22 - 32 mmol/L   Hemoglobin 14.3 12.0 - 15.0 g/dL   HCT 78.442.0 69.636.0 - 29.546.0 %   Comment NOTIFIED PHYSICIAN   SARS Coronavirus 2 (CEPHEID - Performed in [facility] hospital lab), Hosp Order     Status: None   Collection Time: 05/21/19  3:20 PM   Specimen: Nasopharyngeal Swab  Result Value Ref Range   SARS Coronavirus 2 NEGATIVE NEGATIVE    Comment: (NOTE) If result is NEGATIVE SARS-CoV-2 target nucleic acids are NOT DETECTED. The SARS-CoV-2 RNA is generally detectable in upper  and lower  respiratory specimens during the acute phase of infection. The lowest  concentration of SARS-CoV-2  viral copies this assay can detect is 250  copies / mL. A negative result does not preclude SARS-CoV-2 infection  and should not be used as the sole basis for treatment or other  patient management decisions.  A negative result may occur with  improper specimen collection / handling, submission of specimen other  than nasopharyngeal swab, presence of viral mutation(s) within the  areas targeted by this assay, and inadequate number of viral copies  (<250 copies / mL). A negative result must be combined with clinical  observations, patient history, and epidemiological information. If result is POSITIVE SARS-CoV-2 target nucleic acids are DETECTED. The SARS-CoV-2 RNA is generally detectable in upper and lower  respiratory specimens dur ing the acute phase of infection.  Positive  results are indicative of active infection with SARS-CoV-2.  Clinical  correlation with patient history and other diagnostic information is  necessary to determine patient infection status.  Positive results do  not rule out bacterial infection or co-infection with other viruses. If result is PRESUMPTIVE POSTIVE SARS-CoV-2 nucleic acids MAY BE PRESENT.   A presumptive positive result was obtained on the submitted specimen  and confirmed on repeat testing.  While 2019 novel coronavirus  (SARS-CoV-2) nucleic acids may be present in the submitted sample  additional confirmatory testing may be necessary for epidemiological  and / or clinical management purposes  to differentiate between  SARS-CoV-2 and other Sarbecovirus currently known to infect humans.  If clinically indicated additional testing with an alternate test  methodology [phone]) is advised. The SARS-CoV-2 RNA is generally  detectable in upper and lower respiratory sp ecimens during the acute  phase of infection. The expected result is Negative. Fact Sheet for Patients:  BoilerBrush.com.cy Fact Sheet for Healthcare  Providers: https://pope.com/ This test is not yet approved or cleared by the [place] FDA and has been authorized for detection and/or diagnosis of SARS-CoV-2 by FDA under an Emergency Use Authorization (EUA).  This EUA will remain in effect (meaning this test can be used) for the duration of the COVID-19 declaration under Section 564(b)(1) of the Act, 21 U.S.C. section 360bbb-3(b)(1), unless the authorization is terminated or revoked sooner. Performed at [facility], [address], [place], [place] [address]   TSH     Status: None   Collection Time: 05/21/19  3:50 PM  Result Value Ref Range   TSH 1.368 0.350 - 4.500 uIU/mL    Comment: Performed by a 3rd Generation assay with a functional sensitivity of <=0.01 uIU/mL. Performed at [facility], [address], [place], [place] [address]   Magnesium     Status: None   Collection Time: 05/21/19  3:58 PM  Result Value Ref Range   Magnesium 2.2 1.7 - 2.4 mg/dL    Comment: Performed at [facility], [address], [place], [place] [address]   Ct Angio Head W Or Wo Contrast  Result Date: 05/21/2019 CLINICAL DATA:  Right facial droop and aphasia. EXAM: CT ANGIOGRAPHY HEAD AND NECK TECHNIQUE: Multidetector CT imaging of the head and neck was performed using the standard protocol during bolus administration of intravenous contrast. Multiplanar CT image reconstructions and MIPs were obtained to evaluate the vascular anatomy. Carotid stenosis measurements (when applicable) are obtained utilizing NASCET criteria, using the distal internal carotid diameter as the denominator. CONTRAST:  75mL OMNIPAQUE IOHEXOL 350 MG/ML SOLN COMPARISON:  Head CT  earlier same day FINDINGS: CTA NECK FINDINGS Aortic arch: Aortic atherosclerosis and ectasia. No brachiocephalic vessel origin stenosis is seen. Left vertebral artery arises directly from the arch. Right carotid system: Common carotid artery is widely patent to  the bifurcation. Carotid bifurcation is widely patent without stenosis or irregularity. Cervical ICA is tortuous. 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 Left carotid system: Common carotid artery widely patent to the bifurcation. Carotid bifurcation shows mild atherosclerotic plaque but no stenosis or irregularity. Cervical ICA is tortuous but widely patent. Vertebral arteries: Both vertebral artery origins are widely patent. Left arises from the arch as noted above. Both vertebral arteries are widely patent through the cervical region to the foramen magnum. Skeleton: Mild cervical spondylosis. Other neck: No mass or adenopathy. Upper chest: Upper lungs are clear. Review of the MIP images confirms the above findings CTA HEAD FINDINGS Anterior circulation: Both internal carotid arteries are patent through the skull base and siphon regions. No siphon stenosis. The anterior and middle cerebral vessels are patent without proximal stenosis, aneurysm or vascular malformation. Anterior and middle cerebral vessels appear patent without large or medium vessel occlusion. Posterior circulation: Both vertebral arteries are widely patent to the basilar. No basilar stenosis. Posterior circulation branch vessels are normal. Venous sinuses: Patent and normal. Anatomic variants: None significant. Delayed phase: Not performed. Review of the MIP images confirms the above findings IMPRESSION: No large or medium vessel occlusion. Markedly tortuous vessels suggesting a history of hypertension. Aortic atherosclerosis and ectasia. No carotid bifurcation stenosis or vertebral origin stenosis. Upper cervical internal carotid arteries are markedly tortuous, more so on the right than the left. 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 No significant intracranial finding. These results were communicated to Dr. Leonie Man at  3:00 pmon 7/7/2020by text page via the [facility] messaging system. Electronically Signed   By: Nelson Chimes M.D.   On: 05/21/2019 15:11   Ct Angio Neck W Or Wo Contrast  Result Date: 05/21/2019 CLINICAL DATA:  Right facial droop and aphasia. EXAM: CT ANGIOGRAPHY HEAD AND NECK TECHNIQUE: Multidetector CT imaging of the head and neck was performed using the standard protocol during bolus administration of intravenous contrast. Multiplanar CT image reconstructions and MIPs were obtained to evaluate the vascular anatomy. Carotid stenosis measurements (when applicable) are obtained utilizing NASCET criteria, using the distal internal carotid diameter as the denominator. CONTRAST:  70mL OMNIPAQUE IOHEXOL 350 MG/ML SOLN COMPARISON:  Head CT earlier same day FINDINGS: CTA NECK FINDINGS Aortic arch: Aortic atherosclerosis and ectasia. No brachiocephalic vessel origin stenosis is seen. Left vertebral artery arises directly from the arch. Right carotid system: Common carotid artery is widely patent to the bifurcation. Carotid bifurcation is widely patent without stenosis or irregularity. Cervical ICA is tortuous. 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 Left carotid system: Common carotid artery widely patent to the bifurcation. Carotid bifurcation shows mild atherosclerotic plaque but no stenosis or irregularity. Cervical ICA is tortuous but widely patent. Vertebral arteries: Both vertebral artery origins are widely patent. Left arises from the arch as noted above. Both vertebral arteries are widely patent through the cervical region to the foramen magnum. Skeleton: Mild cervical spondylosis. Other neck: No mass or adenopathy. Upper chest: Upper lungs are clear. Review of the MIP images confirms the above findings CTA HEAD FINDINGS Anterior circulation:  Both internal carotid arteries are patent through the skull base and siphon regions. No siphon  stenosis. The anterior and middle cerebral vessels are patent without proximal stenosis, aneurysm or vascular malformation. Anterior and middle cerebral vessels appear patent without large or medium vessel occlusion. Posterior circulation: Both vertebral arteries are widely patent to the basilar. No basilar stenosis. Posterior circulation branch vessels are normal. Venous sinuses: Patent and normal. Anatomic variants: None significant. Delayed phase: Not performed. Review of the MIP images confirms the above findings IMPRESSION: No large or medium vessel occlusion. Markedly tortuous vessels suggesting a history of hypertension. Aortic atherosclerosis and ectasia. No carotid bifurcation stenosis or vertebral origin stenosis. Upper cervical internal carotid arteries are markedly tortuous, more so on the right than the left. 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 No significant intracranial finding. These results were communicated to Dr. Pearlean BrownieSethi at 3:00 pmon 7/7/2020by text page via the [facility] messaging system. Electronically Signed   By: Paulina FusiMark  Shogry M.D.   On: 05/21/2019 15:11   Ct Cervical Spine Wo Contrast  Result Date: 05/21/2019 CLINICAL DATA:  Fall, high risk of cervical spine trauma EXAM: CT CERVICAL SPINE WITHOUT CONTRAST TECHNIQUE: Multidetector CT imaging of the cervical spine were obtained during acquisition of CTA imaging of the head and neck. Multiplanar CT image reconstructions were also generated. COMPARISON:  None FINDINGS: Alignment: 2.2 mm of anterolisthesis at C4-C5. Remaining cervical alignments normal Skull base and vertebrae: Visualized skull base intact. Vertebral body heights maintained. Multilevel facet degenerative changes. No fracture, subluxation, or bone destruction. Soft tissues and spinal canal: Prevertebral soft tissues normal thickness. Distal cervical and proximal thoracic esophagus is air-filled and mildly distended. Cervical  soft tissues otherwise unremarkable. Disc levels:  No significant abnormalities Upper chest: Lung apices clear. 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 No acute cervical spine abnormalities. Electronically Signed   By: Ulyses SouthwardMark  Boles M.D.   On: 05/21/2019 16:02   Ct Head Code Stroke Wo Contrast  Result Date: 05/21/2019 CLINICAL DATA:  Code stroke.  Right facial droop and aphasia. EXAM: CT HEAD WITHOUT CONTRAST TECHNIQUE: Contiguous axial images were obtained from the base of the skull through the vertex without intravenous contrast. COMPARISON:  None. FINDINGS: Brain: Age related volume loss. There chronic small-vessel ischemic changes affecting the hemispheric white matter of a mild degree. No sign of acute infarction, mass lesion, hemorrhage, hydrocephalus or extra-axial collection. Vascular: No convincing hyperdense vessel. One could question a hyperdense focus in the left M1 segment region. Skull: Negative Sinuses/Orbits: Clear/normal Other: None ASPECTS (Alberta Stroke Program Early CT Score) - Ganglionic level infarction (caudate, lentiform nuclei, internal capsule, insula, M1-M3 cortex): 7 - Supraganglionic infarction (M4-M6 cortex): 3 Total score (0-10 with 10 being normal): 10 IMPRESSION: 1. Mild chronic small-vessel change of the hemispheric white matter. No acute infarction seen. No hemorrhage. One could question a small hyperdense focus in the left M1 segment. 2. ASPECTS is 10. 3. These results were communicated to Dr. Pearlean BrownieSethi at 2:55 pmon 7/7/2020by text page via the [facility] messaging system. Electronically Signed   By: Paulina FusiMark  Shogry M.D.   On: 05/21/2019 14:57    Pending Labs Unresulted Labs (From admission, onward)    Start     Ordered   05/22/19 0500  Hemoglobin A1c  Tomorrow morning,   R     05/21/19 1454   05/22/19 0500  Lipid panel  Tomorrow morning,   R    Comments: Fasting    05/21/19 1454    05/21/19 1510  Urinalysis, Routine w reflex microscopic  ONCE - STAT,   STAT     05/21/19 1509          Vitals/Pain Today's Vitals   05/21/19 1722 05/21/19 1730 05/21/19 1745 05/21/19 1800  BP:  124/72 116/79 110/74  Pulse:  90 89 94  Resp:  17 18 (!) 22  Temp:      TempSrc:      SpO2:  99% 99% 100%  Weight:      PainSc: 1        Isolation Precautions No active isolations  Medications Medications   stroke: mapping our early stages of recovery book (has no administration in time range)  0.9 %  sodium chloride infusion ( Intravenous New Bag/Given 05/21/19 1550)  acetaminophen (TYLENOL) tablet 650 mg (has no administration in time range)    Or  acetaminophen (TYLENOL) solution 650 mg (has no administration in time range)    Or  acetaminophen (TYLENOL) suppository 650 mg (has no administration in time range)  senna-docusate (Senokot-S) tablet 1 tablet (has no administration in time range)  pantoprazole (PROTONIX) injection 40 mg (has no administration in time range)  hydrALAZINE (APRESOLINE) injection 20 mg (has no administration in time range)  labetalol (NORMODYNE) injection 20 mg (20 mg Intravenous Not Given 05/21/19 1527)    And  clevidipine (CLEVIPREX) infusion 0.5 mg/mL (0 mg/hr Intravenous Not Given 05/21/19 1527)  iohexol (OMNIPAQUE) 350 MG/ML injection 100 mL (75 mLs Intravenous Contrast Given 05/21/19 1502)  alteplase (ACTIVASE) 1 mg/mL infusion 50.2 mg (0 mg/kg  55.8 kg Intravenous Stopped 05/21/19 1555)    Followed by  0.9 %  sodium chloride infusion (0 mLs Intravenous Stopped 05/21/19 1721)    Mobility walks Moderate fall risk   Focused Assessments Neuro Assessment Handoff:  Swallow screen pass? Yes    NIH Stroke Scale ( + Modified Stroke Scale Criteria)  Interval: Initial Level of Consciousness (1a.)   : Alert, keenly responsive LOC Questions (1b. )   +: Answers both questions correctly LOC Commands (1c. )   + : Performs both tasks correctly Best Gaze (2. )   +: Normal Visual (3. )  +: No visual loss Facial Palsy (4. )    : Normal symmetrical movements Motor Arm, Left (5a. )   +: No drift Motor Arm, Right (5b. )   +: No drift Motor Leg, Left (6a. )   +: No drift Motor Leg, Right (6b. )   +: No drift Limb Ataxia (7. ): Absent Sensory (8. )   +: Normal, no sensory loss Best Language (9. )   +: No aphasia Dysarthria (10. ): Normal Extinction/Inattention (11.)   +: No Abnormality Modified SS Total  +: 0 Complete NIHSS TOTAL: 0 Last date known well: 05/21/19 Last time known well: 1355 Neuro Assessment: Exceptions to WDL Neuro Checks:   Initial (05/21/19 1600)  Last Documented NIHSS Modified Score: 0 (05/21/19 1800) Has TPA been given? Yes Temp: 98.9 F (37.2 C) (07/07 1448) Temp Source: Oral (07/07 1448) BP: 110/74 (07/07 1800) Pulse Rate: 94 (07/07 1800) If patient is a Neuro Trauma and patient is going to OR before floor call report to 4N Charge nurse: [phone] or [phone]     R Recommendations: See Admitting Provider Note  Report given to:   Additional Notes:

## 2019-05-21 NOTE — H&P (Signed)
Admission H&P   Chief Complaint: code stroke HPI: Anna Castaneda is an 73 y.o. female with past medical history of arthritis, gastroesophageal reflux disease, myocardial infarction, osteoporosis and broken heart syndrome who was brought in as a code stroke by [facility].  She lives in the independent living facility where she was found down at her mailbox and 1:50 PM today.  10 minutes prior she had been seen to be normal by my witness.  EMS noticed that she had severe expressive and receptive language difficulties as well as right facial droop.  She had NIH stroke scale of 5 on admission but appears to be improving slightly by the time and said noncontrast CT scan of the head was performed which showed no acute abnormality.  Patient's medication list was reviewed and did not have any anticoagulants.  Blood pressure was mildly elevated and blood glucose was 170.  No family was available and patient was not able to consent for TPA but considering risk-benefit it was administered in the patient's interest. LSN: 1:40 PM on 05/21/2019 tPA Given: Yes NIHSS 5 Baseline mrs 0  Past Medical History:  Diagnosis Date  . Arthritis 2018  . GERD (gastroesophageal reflux disease) 2005  . Myocardial infarction (HCC) 02/01/2016   Broken Heart Syndrome  . Osteoporosis 2018    Past Surgical History:  Procedure Laterality Date  . CATARACT EXTRACTION Bilateral 2004  . TONSILLECTOMY Bilateral 1954  . TUBAL LIGATION  1980    No family history on file. Social History:  reports that she quit smoking about 30 years ago. Her smoking use included cigarettes. She has a 15.00 pack-year smoking history. She has never used smokeless tobacco. No history on file for alcohol and drug.  Allergies: Not on File  (Not in a hospital admission)   ROS: Not obtainable from the patient and as stated in history of present illness only.  Physical Examination: Blood pressure (!) 155/85, pulse 83, temperature 98.9 F (37.2  C), temperature source Oral, resp. rate 18, weight 55.8 kg, SpO2 98 %.  Frail elderly Caucasian lady who is not in distress.  She appears slightly anxious and restless.  She has few superficial bruises on her elbows and right leg from a fall.  She is wearing a neck collar.  She appears hard of hearing . Afebrile. Head is nontraumatic. Neck is supple without bruit.    Cardiac exam no murmur or gallop. Lungs are clear to auscultation. Distal pulses are well felt.  Neurologic Examination: She is awake and alert.  She has mild expressive and receptive aphasia.  She speaks short sentences and few words.  She understands simple commands and one-step commands only.  She has trouble with reading and naming.  She is less and occasional words.  Fundi not visualized.  She blinks to threat bilaterally.  Face is symmetric without weakness.  Tongue midline.  Motor system exam no obvious upper or lower extremity drift though her cooperation is variable.  She withdraws all 4 extremities equally.  Deep tendon reflexes are symmetric.  Plantars are downgoing.  Sensation appears preserved bilaterally. NIH stroke scale 5 but mostly due to her aphasia and difficulty in following commands  Results for orders placed or performed during the hospital encounter of 05/21/19 (from the past 48 hour(s))  Protime-INR     Status: None   Collection Time: 05/21/19  2:40 PM  Result Value Ref Range   Prothrombin Time 14.4 11.4 - 15.2 seconds   INR 1.1 0.8 - 1.2  Comment: (NOTE) INR goal varies based on device and disease states. Performed at [facility], [address] [place], [place] [address]   APTT     Status: None   Collection Time: 05/21/19  2:40 PM  Result Value Ref Range   aPTT 29 24 - 36 seconds    Comment: Performed at [facility], [address] [place], [place] [address]  CBC     Status: Abnormal   Collection Time: 05/21/19  2:40 PM  Result Value Ref Range   WBC 8.8 4.0 - 10.5 K/uL   RBC 4.13  3.87 - 5.11 MIL/uL   Hemoglobin 13.2 12.0 - 15.0 g/dL   HCT 78.436.4 69.636.0 - 29.546.0 %   MCV 88.1 80.0 - 100.0 fL   MCH 32.0 26.0 - 34.0 pg   MCHC 36.3 (H) 30.0 - 36.0 g/dL   RDW 28.411.9 13.211.5 - 44.015.5 %   Platelets 375 150 - 400 K/uL   nRBC 0.0 0.0 - 0.2 %    Comment: Performed at [facility], [address] [place], [place] [address]  Differential     Status: None   Collection Time: 05/21/19  2:40 PM  Result Value Ref Range   Neutrophils Relative % 81 %   Neutro Abs 7.2 1.7 - 7.7 K/uL   Lymphocytes Relative 10 %   Lymphs Abs 0.9 0.7 - 4.0 K/uL   Monocytes Relative 8 %   Monocytes Absolute 0.7 0.1 - 1.0 K/uL   Eosinophils Relative 0 %   Eosinophils Absolute 0.0 0.0 - 0.5 K/uL   Basophils Relative 0 %   Basophils Absolute 0.0 0.0 - 0.1 K/uL   Immature Granulocytes 1 %   Abs Immature Granulocytes 0.05 0.00 - 0.07 K/uL    Comment: Performed at [facility], [address] [place], [place] [address]  CBG monitoring, ED     Status: Abnormal   Collection Time: 05/21/19  2:43 PM  Result Value Ref Range   Glucose-Capillary 169 (H) 70 - 99 mg/dL  I-stat chem 8, ED     Status: Abnormal   Collection Time: 05/21/19  2:45 PM  Result Value Ref Range   Sodium 104 (LL) 135 - 145 mmol/L   Potassium 4.2 3.5 - 5.1 mmol/L   Chloride 75 (L) 98 - 111 mmol/L   BUN 6 (L) 8 - 23 mg/dL   Creatinine, Ser 4.030.50 0.44 - 1.00 mg/dL   Glucose, Bld 474164 (H) 70 - 99 mg/dL   Calcium, Ion 2.590.88 (LL) 1.15 - 1.40 mmol/L   TCO2 18 (L) 22 - 32 mmol/L   Hemoglobin 14.3 12.0 - 15.0 g/dL   HCT 56.342.0 87.536.0 - 64.346.0 %   Comment NOTIFIED PHYSICIAN    Ct Angio Head W Or Wo Contrast  Result Date: 05/21/2019 CLINICAL DATA:  Right facial droop and aphasia. EXAM: CT ANGIOGRAPHY HEAD AND NECK TECHNIQUE: Multidetector CT imaging of the head and neck was performed using the standard protocol during bolus administration of intravenous contrast. Multiplanar CT image reconstructions and MIPs were obtained to evaluate the vascular  anatomy. Carotid stenosis measurements (when applicable) are obtained utilizing NASCET criteria, using the distal internal carotid diameter as the denominator. CONTRAST:  75mL OMNIPAQUE IOHEXOL 350 MG/ML SOLN COMPARISON:  Head CT earlier same day FINDINGS: CTA NECK FINDINGS Aortic arch: Aortic atherosclerosis and ectasia. No brachiocephalic vessel origin stenosis is seen. Left vertebral artery arises directly from the arch. Right carotid system: Common carotid artery is  widely patent to the bifurcation. Carotid bifurcation is widely patent without stenosis or irregularity. Cervical ICA is tortuous. 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 Left carotid system: Common carotid artery widely patent to the bifurcation. Carotid bifurcation shows mild atherosclerotic plaque but no stenosis or irregularity. Cervical ICA is tortuous but widely patent. Vertebral arteries: Both vertebral artery origins are widely patent. Left arises from the arch as noted above. Both vertebral arteries are widely patent through the cervical region to the foramen magnum. Skeleton: Mild cervical spondylosis. Other neck: No mass or adenopathy. Upper chest: Upper lungs are clear. Review of the MIP images confirms the above findings CTA HEAD FINDINGS Anterior circulation: Both internal carotid arteries are patent through the skull base and siphon regions. No siphon stenosis. The anterior and middle cerebral vessels are patent without proximal stenosis, aneurysm or vascular malformation. Anterior and middle cerebral vessels appear patent without large or medium vessel occlusion. Posterior circulation: Both vertebral arteries are widely patent to the basilar. No basilar stenosis. Posterior circulation branch vessels are normal. Venous sinuses: Patent and normal. Anatomic variants: None significant. Delayed phase: Not performed. Review of the MIP images confirms the above  findings IMPRESSION: No large or medium vessel occlusion. Markedly tortuous vessels suggesting a history of hypertension. Aortic atherosclerosis and ectasia. No carotid bifurcation stenosis or vertebral origin stenosis. Upper cervical internal carotid arteries are markedly tortuous, more so on the right than the left. 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 No significant intracranial finding. These results were communicated to Dr. Pearlean BrownieSethi at 3:00 pmon 7/7/2020by text page via the [facility] messaging system. Electronically Signed   By: Paulina FusiMark  Shogry M.D.   On: 05/21/2019 15:11   Ct Angio Neck W Or Wo Contrast  Result Date: 05/21/2019 CLINICAL DATA:  Right facial droop and aphasia. EXAM: CT ANGIOGRAPHY HEAD AND NECK TECHNIQUE: Multidetector CT imaging of the head and neck was performed using the standard protocol during bolus administration of intravenous contrast. Multiplanar CT image reconstructions and MIPs were obtained to evaluate the vascular anatomy. Carotid stenosis measurements (when applicable) are obtained utilizing NASCET criteria, using the distal internal carotid diameter as the denominator. CONTRAST:  75mL OMNIPAQUE IOHEXOL 350 MG/ML SOLN COMPARISON:  Head CT earlier same day FINDINGS: CTA NECK FINDINGS Aortic arch: Aortic atherosclerosis and ectasia. No brachiocephalic vessel origin stenosis is seen. Left vertebral artery arises directly from the arch. Right carotid system: Common carotid artery is widely patent to the bifurcation. Carotid bifurcation is widely patent without stenosis or irregularity. Cervical ICA is tortuous. 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 Left carotid system: Common carotid artery widely patent to the bifurcation. Carotid bifurcation shows mild atherosclerotic plaque but no stenosis or irregularity. Cervical ICA is tortuous but widely patent.  Vertebral arteries: Both vertebral artery origins are widely patent. Left arises from the arch as noted above. Both vertebral arteries are widely patent through the cervical region to the foramen magnum. Skeleton: Mild cervical spondylosis. Other neck: No mass or adenopathy. Upper chest: Upper lungs are clear. Review of the MIP images confirms the above findings CTA HEAD FINDINGS Anterior circulation: Both internal carotid arteries are patent through the skull base and siphon regions. No siphon stenosis. The anterior and middle cerebral vessels are patent without proximal stenosis, aneurysm or vascular malformation. Anterior and middle cerebral  vessels appear patent without large or medium vessel occlusion. Posterior circulation: Both vertebral arteries are widely patent to the basilar. No basilar stenosis. Posterior circulation branch vessels are normal. Venous sinuses: Patent and normal. Anatomic variants: None significant. Delayed phase: Not performed. Review of the MIP images confirms the above findings IMPRESSION: No large or medium vessel occlusion. Markedly tortuous vessels suggesting a history of hypertension. Aortic atherosclerosis and ectasia. No carotid bifurcation stenosis or vertebral origin stenosis. Upper cervical internal carotid arteries are markedly tortuous, more so on the right than the left. 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 No significant intracranial finding. These results were communicated to Dr. Leonie Man at 3:00 pmon 7/7/2020by text page via the [facility] messaging system. Electronically Signed   By: Nelson Chimes M.D.   On: 05/21/2019 15:11   Ct Head Code Stroke Wo Contrast  Result Date: 05/21/2019 CLINICAL DATA:  Code stroke.  Right facial droop and aphasia. EXAM: CT HEAD WITHOUT CONTRAST TECHNIQUE: Contiguous axial images were obtained from the base of the skull through the vertex without intravenous contrast. COMPARISON:  None.  FINDINGS: Brain: Age related volume loss. There chronic small-vessel ischemic changes affecting the hemispheric white matter of a mild degree. No sign of acute infarction, mass lesion, hemorrhage, hydrocephalus or extra-axial collection. Vascular: No convincing hyperdense vessel. One could question a hyperdense focus in the left M1 segment region. Skull: Negative Sinuses/Orbits: Clear/normal Other: None ASPECTS (Experiment Stroke Program Early CT Score) - Ganglionic level infarction (caudate, lentiform nuclei, internal capsule, insula, M1-M3 cortex): 7 - Supraganglionic infarction (M4-M6 cortex): 3 Total score (0-10 with 10 being normal): 10 IMPRESSION: 1. Mild chronic small-vessel change of the hemispheric white matter. No acute infarction seen. No hemorrhage. One could question a small hyperdense focus in the left M1 segment. 2. ASPECTS is 10. 3. These results were communicated to Dr. Leonie Man at 2:55 pmon 7/7/2020by text page via the [facility] messaging system. Electronically Signed   By: Nelson Chimes M.D.   On: 05/21/2019 14:57    Assessment: 73 y.o. 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  She seems to be improving somewhat but still has significant aphasia and is within the time window for IV thrombolysis to justify giving it.  CT scan of the head noncontrast is unremarkable and CT angiogram shows no significant large vessel occlusion though there is slightly diminished branches in the peripheral left MCA.  No family is available at the present time for consent but it is in the patient's interest to get TPA with a small but acceptable risk of hemorrhage as her aphasia seems quite disabling  Stroke Risk Factors - hypertension and age  Plan: Patient will be admitted to the intensive care unit for close neurological monitoring and strict blood pressure control as per post TPA protocol.  Check physical therapy speech therapy and Occupational  Therapy consults.  Resume home medications when able to swallow safely.  Check MRI scan of the brain later, echocardiogram, lipid profile, hemoglobin A1c.Check EEG for seizures No family available at the bedside for discus this    Discussed with ER MD and RN. This patient is critically ill and at significant risk of neurological worsening,post tpa hemorrhage, recurrent stroke, seizure, death and care requires constant monitoring of vital signs, hemodynamics,respiratory and cardiac monitoring, extensive review of multiple databases, frequent neurological assessment, discussion with family, other specialists and medical decision making of high complexity.I  have made any additions or clarifications directly to the above note.This critical care time does not reflect procedure time, or teaching time or supervisory time of PA/NP/Med Resident etc but could involve care discussion time.  I spent 50 minutes of neurocritical care time  in the care of  this patient.     Delia Heady, MD Medical Director [facility] Pager: [phone] 05/21/2019 3:38 PM

## 2019-05-22 ENCOUNTER — Encounter (HOSPITAL_COMMUNITY): Payer: Self-pay

## 2019-05-22 ENCOUNTER — Inpatient Hospital Stay (HOSPITAL_COMMUNITY): Payer: Medicare Other

## 2019-05-22 DIAGNOSIS — I351 Nonrheumatic aortic (valve) insufficiency: Secondary | ICD-10-CM

## 2019-05-22 DIAGNOSIS — E785 Hyperlipidemia, unspecified: Secondary | ICD-10-CM | POA: Diagnosis present

## 2019-05-22 DIAGNOSIS — R578 Other shock: Secondary | ICD-10-CM | POA: Diagnosis not present

## 2019-05-22 DIAGNOSIS — I10 Essential (primary) hypertension: Secondary | ICD-10-CM | POA: Diagnosis present

## 2019-05-22 DIAGNOSIS — E876 Hypokalemia: Secondary | ICD-10-CM | POA: Diagnosis present

## 2019-05-22 LAB — BASIC METABOLIC PANEL
Anion gap: 10 (ref 5–15)
Anion gap: 9 (ref 5–15)
Anion gap: 10 (ref 5–15)
Anion gap: 11 (ref 5–15)
Anion gap: 8 (ref 5–15)
Anion gap: 9 (ref 5–15)
Anion gap: 12 (ref 5–15)
BUN: 5 mg/dL — ABNORMAL LOW (ref 8–23)
BUN: 5 mg/dL — ABNORMAL LOW (ref 8–23)
BUN: 5 mg/dL — ABNORMAL LOW (ref 8–23)
BUN: 5 mg/dL — ABNORMAL LOW (ref 8–23)
BUN: <5 mg/dL — ABNORMAL LOW (ref 8–23)
BUN: <5 mg/dL — ABNORMAL LOW (ref 8–23)
BUN: <5 mg/dL — ABNORMAL LOW (ref 8–23)
CO2: 17 mmol/L — ABNORMAL LOW (ref 22–32)
CO2: 19 mmol/L — ABNORMAL LOW (ref 22–32)
CO2: 18 mmol/L — ABNORMAL LOW (ref 22–32)
CO2: 18 mmol/L — ABNORMAL LOW (ref 22–32)
CO2: 20 mmol/L — ABNORMAL LOW (ref 22–32)
CO2: 20 mmol/L — ABNORMAL LOW (ref 22–32)
CO2: 19 mmol/L — ABNORMAL LOW (ref 22–32)
Calcium: 7.4 mg/dL — ABNORMAL LOW (ref 8.9–10.3)
Calcium: 7.3 mg/dL — ABNORMAL LOW (ref 8.9–10.3)
Calcium: 7.5 mg/dL — ABNORMAL LOW (ref 8.9–10.3)
Calcium: 7.5 mg/dL — ABNORMAL LOW (ref 8.9–10.3)
Calcium: 7.7 mg/dL — ABNORMAL LOW (ref 8.9–10.3)
Calcium: 7.7 mg/dL — ABNORMAL LOW (ref 8.9–10.3)
Calcium: 8 mg/dL — ABNORMAL LOW (ref 8.9–10.3)
Chloride: 88 mmol/L — ABNORMAL LOW (ref 98–111)
Chloride: 88 mmol/L — ABNORMAL LOW (ref 98–111)
Chloride: 89 mmol/L — ABNORMAL LOW (ref 98–111)
Chloride: 89 mmol/L — ABNORMAL LOW (ref 98–111)
Chloride: 90 mmol/L — ABNORMAL LOW (ref 98–111)
Chloride: 91 mmol/L — ABNORMAL LOW (ref 98–111)
Chloride: 90 mmol/L — ABNORMAL LOW (ref 98–111)
Creatinine, Ser: 0.56 mg/dL (ref 0.44–1.00)
Creatinine, Ser: 0.56 mg/dL (ref 0.44–1.00)
Creatinine, Ser: 0.79 mg/dL (ref 0.44–1.00)
Creatinine, Ser: 0.66 mg/dL (ref 0.44–1.00)
Creatinine, Ser: 0.57 mg/dL (ref 0.44–1.00)
Creatinine, Ser: 0.62 mg/dL (ref 0.44–1.00)
Creatinine, Ser: 0.58 mg/dL (ref 0.44–1.00)
GFR calc Af Amer: >60 mL/min (ref >60)
GFR calc Af Amer: >60 mL/min (ref >60)
GFR calc Af Amer: >60 mL/min (ref >60)
GFR calc Af Amer: >60 mL/min (ref >60)
GFR calc Af Amer: >60 mL/min (ref >60)
GFR calc Af Amer: >60 mL/min (ref >60)
GFR calc Af Amer: >60 mL/min (ref >60)
GFR calc non Af Amer: >60 mL/min (ref >60)
GFR calc non Af Amer: >60 mL/min (ref >60)
GFR calc non Af Amer: >60 mL/min (ref >60)
GFR calc non Af Amer: >60 mL/min (ref >60)
GFR calc non Af Amer: >60 mL/min (ref >60)
GFR calc non Af Amer: >60 mL/min (ref >60)
GFR calc non Af Amer: >60 mL/min (ref >60)
Glucose, Bld: 114 mg/dL — ABNORMAL HIGH (ref 70–99)
Glucose, Bld: 105 mg/dL — ABNORMAL HIGH (ref 70–99)
Glucose, Bld: 94 mg/dL (ref 70–99)
Glucose, Bld: 94 mg/dL (ref 70–99)
Glucose, Bld: 96 mg/dL (ref 70–99)
Glucose, Bld: 93 mg/dL (ref 70–99)
Glucose, Bld: 113 mg/dL — ABNORMAL HIGH (ref 70–99)
Potassium: 3.6 mmol/L (ref 3.5–5.1)
Potassium: 3.4 mmol/L — ABNORMAL LOW (ref 3.5–5.1)
Potassium: 3.3 mmol/L — ABNORMAL LOW (ref 3.5–5.1)
Potassium: 3.3 mmol/L — ABNORMAL LOW (ref 3.5–5.1)
Potassium: 3.4 mmol/L — ABNORMAL LOW (ref 3.5–5.1)
Potassium: 3.5 mmol/L (ref 3.5–5.1)
Potassium: 3.3 mmol/L — ABNORMAL LOW (ref 3.5–5.1)
Sodium: 115 mmol/L — CL (ref 135–145)
Sodium: 116 mmol/L — CL (ref 135–145)
Sodium: 117 mmol/L — CL (ref 135–145)
Sodium: 118 mmol/L — CL (ref 135–145)
Sodium: 118 mmol/L — CL (ref 135–145)
Sodium: 120 mmol/L — ABNORMAL LOW (ref 135–145)
Sodium: 121 mmol/L — ABNORMAL LOW (ref 135–145)

## 2019-05-22 LAB — ECHOCARDIOGRAM COMPLETE: Weight: 1968.27 oz

## 2019-05-22 LAB — LIPID PANEL
Cholesterol: 126 mg/dL (ref 0–200)
HDL: 74 mg/dL (ref >40)
LDL Cholesterol: 45 mg/dL (ref 0–99)
Total CHOL/HDL Ratio: 1.7 RATIO
Triglycerides: 37 mg/dL (ref <150)
VLDL: 7 mg/dL (ref 0–40)

## 2019-05-22 LAB — CBC
HCT: 30.4 % — ABNORMAL LOW (ref 36.0–46.0)
Hemoglobin: 11.4 g/dL — ABNORMAL LOW (ref 12.0–15.0)
MCH: 32.6 pg (ref 26.0–34.0)
MCHC: 37.5 g/dL — ABNORMAL HIGH (ref 30.0–36.0)
MCV: 86.9 fL (ref 80.0–100.0)
Platelets: 367 10*3/uL (ref 150–400)
RBC: 3.5 MIL/uL — ABNORMAL LOW (ref 3.87–5.11)
RDW: 11.9 % (ref 11.5–15.5)
WBC: 8.4 10*3/uL (ref 4.0–10.5)
nRBC: 0 % (ref 0.0–0.2)

## 2019-05-22 LAB — HEMOGLOBIN A1C
Hgb A1c MFr Bld: 5.2 % (ref 4.8–5.6)
Mean Plasma Glucose: 102.54 mg/dL

## 2019-05-22 LAB — MRSA PCR SCREENING: MRSA by PCR: NEGATIVE

## 2019-05-22 MED ORDER — CHLORHEXIDINE GLUCONATE CLOTH 2 % EX PADS
6.0000 | MEDICATED_PAD | Freq: Every day | CUTANEOUS | Status: DC
  Administered 2019-05-22 – 2019-05-24 (×2): 6 via TOPICAL

## 2019-05-22 MED ORDER — POTASSIUM CHLORIDE CRYS ER 20 MEQ PO TBCR
40.0000 meq | EXTENDED_RELEASE_TABLET | Freq: Once | ORAL | Status: AC
  Administered 2019-05-22: 40 meq via ORAL
  Filled 2019-05-22: qty 2

## 2019-05-22 MED ORDER — ASPIRIN 81 MG PO CHEW
81.0000 mg | CHEWABLE_TABLET | Freq: Every day | ORAL | Status: DC
  Administered 2019-05-22 – 2019-05-23 (×2): 81 mg via ORAL
  Filled 2019-05-22 (×4): qty 1

## 2019-05-22 MED ORDER — ATORVASTATIN CALCIUM 10 MG PO TABS
20.0000 mg | ORAL_TABLET | Freq: Every day | ORAL | Status: DC
  Administered 2019-05-22 – 2019-05-23 (×2): 20 mg via ORAL
  Filled 2019-05-22 (×2): qty 2

## 2019-05-22 NOTE — Evaluation (Signed)
Occupational Therapy Evaluation Patient Details Name: Anna Castaneda MRN: [id number] DOB: Jun 15, 1946 Today's Date: 05/22/2019    History of Present Illness Pt is a 73 y.o. F with significant PMH of myocardial infarction, osteoporosis, who was brought in as code stroke. She lives in independent living facility where she was found down at her mailbox. 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 CT unremarkable. Administered TPA. Shortly after arrival to ICU, became hypotensive and developed large right hematoma; tPA reversed.    Clinical Impression   Pt demonstrates balance deficits with lack of awareness. Pt with LOB in bathroom requiring max (A) and self reports that she almost loss her balance. Pt is high fall risk at this time. Recommending SNF level of care at [facility] to progress back toward indep living.      Follow Up Recommendations  SNF([place] [facility])    Equipment Recommendations  Other (comment)(RW)    Recommendations for Other Services       Precautions / Restrictions Precautions Precautions: Fall Restrictions Weight Bearing Restrictions: No      Mobility Bed Mobility Overal bed mobility: Modified Independent                Transfers Overall transfer level: Needs assistance Equipment used: None Transfers: Sit to/from Stand Sit to Stand: Min guard              Balance Overall balance assessment: Needs assistance Sitting-balance support: Feet supported Sitting balance-Leahy Scale: Good     Standing balance support: No upper extremity supported;During functional activity Standing balance-Leahy Scale: Fair                             ADL either performed or assessed with clinical judgement   ADL Overall ADL's : Needs assistance/impaired Eating/Feeding: Modified independent   Grooming: Wash/dry hands;Minimal assistance;Standing Grooming Details (indicate cue type and  reason): unsteady (A) for balance and lacks awareness of deficits. Pt with LOB at EOB  Upper Body Bathing: Minimal assistance Upper Body Bathing Details (indicate cue type and reason): recommending seated position Lower Body Bathing: Moderate assistance;Sit to/from stand       Lower Body Dressing: Minimal assistance;Sit to/from stand;Bed level Lower Body Dressing Details (indicate cue type and reason): pt requires supine position to don socks. pt reports she needs glases to tie shoes but reports she only wears for reading Toilet Transfer: Minimal assistance   Toileting- Clothing Manipulation and Hygiene: Minimal assistance;Sit to/from stand       Functional mobility during ADLs: Minimal assistance General ADL Comments: Pt requesting to utilize the bathroom. pt voiding bowel and bladder.      Vision Baseline Vision/History: Wears glasses Wears Glasses: Reading only(reports vision 20/40)       Perception     Praxis      Pertinent Vitals/Pain Pain Assessment: No/denies pain     Hand Dominance Right   Extremity/Trunk Assessment Upper Extremity Assessment Upper Extremity Assessment: Overall WFL for tasks assessed   Lower Extremity Assessment Lower Extremity Assessment: Defer to PT evaluation   Cervical / Trunk Assessment Cervical / Trunk Assessment: Kyphotic   Communication Communication Communication: No difficulties   Cognition Arousal/Alertness: Awake/alert Behavior During Therapy: WFL for tasks assessed/performed Overall Cognitive Status: Impaired/Different from baseline Area of Impairment: Awareness;Safety/judgement;Problem solving  Safety/Judgement: Decreased awareness of deficits Awareness: Intellectual Problem Solving: Difficulty sequencing General Comments: Pt with decreased awareness of deficits, not recognizing or cognizant of gross imbalance. States she only wears glasses for reading but unable to tie shoes stating, "I  can't see them this way."    General Comments  multiple LOB during session. pt unable to locate room even after stating the room number. pt passing room x2 times     Exercises     Shoulder Instructions      Home Living Family/patient expects to be discharged to:: Private residence Living Arrangements: Alone   Type of Home: Independent living facility       Home Layout: One level     Bathroom Shower/Tub: Producer, television/film/video: Handicapped height     Home Equipment: None          Prior Functioning/Environment Level of Independence: Independent        Comments: Lives in ILF portion of [place]. Cooks, drives, grocery shops. Does Zoom "PT" three times a week for exercise         OT Problem List: Decreased strength;Decreased activity tolerance;Impaired balance (sitting and/or standing);Decreased cognition;Decreased safety awareness;Decreased knowledge of precautions;Decreased knowledge of use of DME or AE      OT Treatment/Interventions: Self-care/ADL training;Therapeutic exercise;Neuromuscular education;Energy conservation;DME and/or AE instruction;Manual therapy;Therapeutic activities;Cognitive remediation/compensation;Patient/family education;Balance training    OT Goals(Current goals can be found in the care plan section) Acute Rehab OT Goals Patient Stated Goal: "go back to [facility]." OT Goal Formulation: With patient Time For Goal Achievement: 06/05/19 Potential to Achieve Goals: Good  OT Frequency: Min 2X/week   Barriers to D/C: Decreased caregiver support          Co-evaluation PT/OT/SLP Co-Evaluation/Treatment: Yes Reason for Co-Treatment: Complexity of the patient's impairments (multi-system involvement);Necessary to address cognition/behavior during functional activity;For patient/therapist safety;To address functional/ADL transfers PT goals addressed during session: Mobility/safety with mobility OT goals addressed during session:  ADL's and self-care;Strengthening/ROM;Proper use of Adaptive equipment and DME      AM-PAC OT "6 Clicks" Daily Activity     Outcome Measure Help from another person eating meals?: None Help from another person taking care of personal grooming?: A Little Help from another person toileting, which includes using toliet, bedpan, or urinal?: A Lot Help from another person bathing (including washing, rinsing, drying)?: A Lot Help from another person to put on and taking off regular upper body clothing?: A Little Help from another person to put on and taking off regular lower body clothing?: A Lot 6 Click Score: 16   End of Session Equipment Utilized During Treatment: Gait belt Nurse Communication: Mobility status;Precautions  Activity Tolerance: Patient tolerated treatment well Patient left: with call bell/phone within reach;in chair;with chair alarm set  OT Visit Diagnosis: Unsteadiness on feet (R26.81);Muscle weakness (generalized) (M62.81)                Time: 1030-1102 OT Time Calculation (min): 32 min Charges:  OT General Charges $OT Visit: 1 Visit OT Evaluation $OT Eval Moderate Complexity: 1 Mod   Mateo Flow, OTR/L  Acute Rehabilitation Services Pager: [phone] Office: [phone] .   Mateo Flow 05/22/2019, 5:09 PM

## 2019-05-22 NOTE — Progress Notes (Signed)
  Echocardiogram 2D Echocardiogram has been performed.  Darlina Sicilian M 05/22/2019, 7:56 AM

## 2019-05-22 NOTE — Progress Notes (Signed)
CRITICAL VALUE ALERT  Critical Value:  Na+ 117  Date & Time Notied:  8421 05/22/19  Provider Notified: Warren Lacy  Orders Received/Actions taken: RN to monitor

## 2019-05-22 NOTE — Progress Notes (Deleted)
CRITICAL VALUE ALERT  Critical Value: Na+ 115   Date & Time Notied: 05/22/19 0115  Provider Notified: Warren Lacy  Orders Received/Actions taken: Continue to monitor

## 2019-05-22 NOTE — Progress Notes (Signed)
EEG Completed; Results Pending  

## 2019-05-22 NOTE — Progress Notes (Signed)
STROKE TEAM PROGRESS NOTE   INTERVAL HISTORY Patient is neurologically improved and speech appears normal.  She states she had not been feeling well for a week and had not been eating and drinking was probably dehydrated.  She has been feeling weak in the legs.  Yesterday she walked to her mailbox and felt that she passed out and collapsed.  She does not remember events of what happened later.  Her serum sodium was low at 118( lab) 104(istat).  EEG was obtained which shows left temporal sharp waves but no definite seizures.  MRI scan is read as no acute infarct but there is weak diffusion positive lesion in the midbrain pontine junction which could represent the lacunar infarct  but cannot clinically explain her presentation Overnight she developed right hip hematoma following TPA which had to be reversed with tranexamic acid.  She was typed and crossmatch but not transfused as hemoglobin dropped only from 13-11.4.  Her blood pressure has remained low in the mid 90s - 100s but stable. Vitals:   05/22/19 0600 05/22/19 0700 05/22/19 0800 05/22/19 0900  BP: 103/62 100/62 (!) 95/54 93/61  Pulse: (!) 58 72 61 65  Resp: '12 16 13 13  ' Temp:   98.6 F (37 C)   TempSrc:   Oral   SpO2: 100% 100% 100% 99%  Weight:        CBC:  Recent Labs  Lab 05/21/19 1440  05/21/19 1933 05/22/19 0455  WBC 8.8  --   --  8.4  NEUTROABS 7.2  --   --   --   HGB 13.2   < > 11.6* 11.4*  HCT 36.4   < > 30.9* 30.4*  MCV 88.1  --   --  86.9  PLT 375  --   --  367   < > = values in this interval not displayed.    Basic Metabolic Panel:  Recent Labs  Lab 05/21/19 1558  05/22/19 0455 05/22/19 0639  NA  --    < > 117* 118*  K  --    < > 3.3* 3.3*  CL  --    < > 89* 89*  CO2  --    < > 18* 18*  GLUCOSE  --    < > 94 94  BUN  --    < > 5* 5*  CREATININE  --    < > 0.79 0.66  CALCIUM  --    < > 7.5* 7.5*  MG 2.2  --   --   --    < > = values in this interval not displayed.   Lipid Panel:     Component Value  Date/Time   CHOL 126 05/22/2019 0455   TRIG 37 05/22/2019 0455   HDL 74 05/22/2019 0455   CHOLHDL 1.7 05/22/2019 0455   VLDL 7 05/22/2019 0455   LDLCALC 45 05/22/2019 0455   HgbA1c:  Lab Results  Component Value Date   HGBA1C 5.2 05/22/2019   Urine Drug Screen: No results found for: LABOPIA, COCAINSCRNUR, LABBENZ, AMPHETMU, THCU, LABBARB  Alcohol Level No results found for: [place] Head Code Stroke Wo Contrast 05/21/2019 1457 1. Mild chronic small-vessel change of the hemispheric white matter. No acute infarction seen. No hemorrhage. One could question a small hyperdense focus in the left M1 segment. 2. ASPECTS is 10.   Ct Angio Head W Or Wo Contrast Ct Angio Neck W Or Wo Contrast 05/21/2019 1511 No large or medium  vessel occlusion. Markedly tortuous vessels suggesting a history of hypertension. Aortic atherosclerosis and ectasia. No carotid bifurcation stenosis or vertebral origin stenosis. Upper cervical internal carotid arteries are markedly tortuous, more so on the right than the left. 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 No significant intracranial finding.   Ct Cervical Spine Wo Contrast 05/21/2019 1602 Degenerative facet disease changes of the cervical spine which likely account for 2.2 mm of anterolisthesis at C4-C5. No acute cervical spine abnormalities.   MRI head 05/22/2019 No acute finding by MRI. Age related atrophy. Mild to moderate chronic small-vessel ischemic changes of the cerebral hemispheric white matter.  2D Echocardiogram  05/21/2019  1. The left ventricle has normal systolic function with an ejection fraction of 60-65%. The cavity size was normal. Moderate Basal septal hypertrophy. Left ventricular diastolic Doppler parameters are consistent with impaired relaxation. No evidence of left ventricular regional wall motion abnormalities.  2. The right ventricle has normal systolic function. The cavity was  normal. There is no increase in right ventricular wall thickness.  3. No subcostal views available to assess pericardium but there appears to be a small pericardial effusion over the RV free wall in the parasternal short axis view.  4. The aortic valve is tricuspid. Moderate sclerosis of the aortic valve. Aortic valve regurgitation is mild to moderate by color flow Doppler.  5. There is mild dilatation of the ascending aorta measuring 40 mm.  EEG This EEG recorded evidence of left anterior temporal irritability which could serve as a potential seizure focus. There was no seizure recorded on this study. Please note that lack of epileptiform activity on EEG does not preclude the possibility of epilepsy.   PHYSICAL EXAM frail elderly Caucasian lady not in distress.  She has large right hip subcutaneous hematoma.  She has abrasions on her elbows.  She is slightly hard of hearing. . Afebrile. Head is nontraumatic. Neck is supple without bruit.    Cardiac exam no murmur or gallop. Lungs are clear to auscultation. Distal pulses are well felt. Neurological Exam ;  Awake  Alert oriented x 3. Normal speech no aphasia and can name repeat and comprehend quite well.  Slightly diminished attention registration and recall.  She follows two-step commands..eye movements full without nystagmus.fundi were not visualized. Vision acuity and fields appear normal. Hearing is normal. Palatal movements are normal. Face symmetric. Tongue midline. Normal strength, tone, reflexes and coordination. Normal sensation. Gait deferred.  ASSESSMENT/PLAN [name] is a 73 y.o. female with history of arthritis, gastroesophageal reflux disease, myocardial infarction, osteoporosis and broken heart syndrome found down at the mailbox presenting with severe expressive and receptive language difficulties as well as right facial droop.  TPA administered 05/21/2019 at 1455.  After TPA administration, sodium found to be 108.  4-1/2 hours  post TPA administration patient developed hematoma and right thigh received transaminase.  Systolic blood pressure in the 90s.  Hemoglobin 11.8.  Cryo and blood transfusion considered but not administered.  Stroke-like episode status post TPA  Symptomatic hyponatremia versus Seizure  Code Stroke CT head No acute abnormality. Small vessel disease. Atrophy. Possible hyperdense L M1.  ASPECTS 10.     CT CS degenerative disease no acute abnormalities  CTA head & neck no LVO.  Tortuous vessels.  Aortic atherosclerosis.  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  No significant finding  MRI  No acute abnormality. Small vessel disease. Atrophy.   2D Echo EF 60-65%. No  source of embolus   EEG w/ sharpe waves, L temporal irritability  LDL 45  HgbA1c 5.2  TSH normal  UA pending   SCDs for VTE prophylaxis  aspirin 81 mg daily prior to admission, now on No antithrombotic as within 24 hours of TPA administration.  Hold aspirin given thigh hematoma  Therapy recommendations: Pending.  Okay to be out of bed - likely need respite vs SNF per therapist  Disposition: Pending.  Admitted from independent living facility [facility]. SW consult to assist w/ dispo  Plan transfer to the floor once 24-hour post TPA window met  Post TPA hemorrhage  Right thigh hematoma  Found down  Received TXA 05/21/2019 around 8 PM  Systolic blood pressure in the 70s at onset  Fibrinogen level 206  Consider cryo but did not administer  Hemoglobin 13.2->11.6 (time of hmg)->11.4 (am)  Hold home aspirin  X-ray right hip and thigh  Hyponatremia  Likely chronic  Sodium on admission 108  CCM discontinued IV fluids  Now up to 118  Confused on admission, now cleared.  This cannot be explained by hyponatremia.  Hypertension   Home meds: Zestril 5  Low blood pressure with bleeding episode during the night.  Remains in the 90s this a.m.  Hold home blood pressure medicines at this time . SBP goal < 160  given hmg in leg . Long-term BP goal normotensive  Hyperlipidemia  Home meds:  lipitor 20  Resume lipitior in hospital once able to swallow  LDL 45, goal < 70  Continue statin at discharge  Other Stroke Risk Factors  Advanced age  Former Cigarette smoker  Coronary artery disease hx MI  Other Active Problems  Hypokalemia 3.4  Hypocalcemia 7.7  GERD on protonix   CCM signed off. IM consulted for hyponatremia  Hospital day # 1  I have personally obtained history,examined this patient, reviewed notes, independently viewed imaging studies, participated in medical decision making and plan of care.ROS completed by me personally and pertinent positives fully documented  I have made any additions or clarifications directly to the above note.  She presented with an unwitnessed fall followed by confusion aphasia and questionable right facial droop leading to concerns about left MCA branch infarct and was treated with IV TPA and appears to have improved.  She developed right thigh and hip hematoma likely at the site of the bruise from the fall.  But she remains hemodynamically stable but needs close monitoring.  Will consult medical hospitalist team for management of hyponatremia.  Mobilize out of bed.  Therapy consults.  Continue ongoing stroke work-up.  Discussed with patient and Dr. Karmen Bongo and critical care team nurse practitioner This patient is critically ill and at significant risk of neurological worsening, death and care requires constant monitoring of vital signs, hemodynamics,respiratory and cardiac monitoring, extensive review of multiple databases, frequent neurological assessment, discussion with family, other specialists and medical decision making of high complexity.I have made any additions or clarifications directly to the above note.This critical care time does not reflect procedure time, or teaching time or supervisory time of PA/NP/Med Resident etc but could involve  care discussion time.  I spent 30 minutes of neurocritical care time  in the care of  this patient.     Antony Contras, MD Medical Director [facility] Pager: [phone] 05/22/2019 3:18 PM   To contact Stroke Continuity provider, please refer to [email or web address] After hours, contact General Neurology

## 2019-05-22 NOTE — Progress Notes (Signed)
PT Cancellation Note  Patient Details Name: Anna Castaneda MRN: [id number] DOB: Nov 04, 1946   Cancelled Treatment:    Reason Eval/Treat Not Completed: Active bedrest order   Ellamae Sia, PT, DPT Acute Rehabilitation Services Pager [phone] Office [phone]    Willy Eddy 05/22/2019, 7:59 AM

## 2019-05-22 NOTE — Procedures (Signed)
History: 73 year old female being evaluated for transient episode of aphasia and facial droop  Sedation: None  Technique: This is a 21 channel routine scalp EEG performed at the bedside with bipolar and monopolar montages arranged in accordance to the international 10/20 system of electrode placement. One channel was dedicated to EKG recording.    Background: The background consists of intermixed alpha and beta activities. There is a well defined posterior dominant rhythm of 10 hz that attenuates with eye opening. Sleep is recorded with normal appearing structures.  There are rare left temporal(F7> T7) sharp waves.  Photic stimulation: Physiologic driving is not performed  EEG Abnormalities: 1) left anterior temporal sharp waves  Clinical Interpretation: This EEG recorded evidence of left anterior temporal irritability which could serve as a potential seizure focus.   There was no seizure recorded on this study. Please note that lack of epileptiform activity on EEG does not preclude the possibility of epilepsy.   Roland Rack, MD [facility] [phone]  If 7pm- 7am, please page neurology on call as listed in [place].

## 2019-05-22 NOTE — Consult Note (Signed)
Medical Consultation   Anna Castaneda  ZOX:[id number]  DOB: [birth year]  DOA: 05/21/2019  PCP: Barbette ReichmannHande, Vishwanath, MD   Outpatient Specialists: Gwen PoundsKowalski - cardiology   Requesting physician: Pearlean BrownieSethi  Reason for consultation: Admitted yesterday, given tPA.  Doing well from stroke standpoint, moving out of ICU today.  Has persistent hyponatremia, still 114.  Overnight with bruising of the hip, concern for hemorrhagic shock.  Given IVF overnight but canceled.  Not eating/drinking well recently.  On lots of medications - need to consider.  Needs improved Na++.  PCCM will see today and sign off.  Likely needs IVF for subacute hyponatremia.    History of Present Illness: Anna Castaneda is an 73 y.o. female with PMH significant for GERD, CAD, and Takotsubo cardiomyopathy presenting with code stroke.  She was given tPA and was also thought to have acute metabolic encephalopathy associated with severe hyponatremia.  She reports feeling much better today.  She is only mildly confused and able to answer most questions appropriately.  She denies any specific concerns.    Review of Systems:  ROS As per HPI otherwise 10 point review of systems negative.    Past Medical History: Past Medical History:  Diagnosis Date   Arthritis 2018   GERD (gastroesophageal reflux disease) 2005   Myocardial infarction (HCC) 02/01/2016   Broken Heart Syndrome   Osteoporosis 2018    Past Surgical History: Past Surgical History:  Procedure Laterality Date   CATARACT EXTRACTION Bilateral 2004   TONSILLECTOMY Bilateral 1954   TUBAL LIGATION  1980     Allergies:   Allergies  Allergen Reactions   Lactose Intolerance (Gi) Diarrhea     Social History:  reports that she quit smoking about 30 years ago. Her smoking use included cigarettes. She has a 15.00 pack-year smoking history. She has never used smokeless tobacco. No history on file for alcohol and drug.   Family History: No  family history on file.    Physical Exam: Vitals:   05/22/19 1334 05/22/19 1400 05/22/19 1500 05/22/19 1600  BP: (!) 143/69 100/62 (!) 100/59   Pulse: 85 94 96   Resp:  17 17   Temp:    98.2 F (36.8 C)  TempSrc:    Oral  SpO2: 100% 100% 100%   Weight:        Constitutional: Alert and awake, oriented x2, not in any acute distress. Eyes:  EOMI, irises appear normal, anicteric sclera,  ENMT: external ears and nose appear normal, normal hearing, Lips appear normal, mildly dry oropharynx mucosa, tongue Neck: neck appears normal, no masses, normal ROM, no thyromegaly, no JVD  CVS: S1-S2 clear, no murmur rubs or gallops, no LE edema, normal pedal pulses  Respiratory:  clear to auscultation bilaterally, no wheezing, rales or rhonchi. Respiratory effort normal. No accessory muscle use.  Abdomen: soft nontender, nondistended, normal bowel sounds, no hepatosplenomegaly, no hernias  Musculoskeletal: : no cyanosis, clubbing or edema noted bilaterally; large right hip hematoma Neuro: Cranial nerves II-XII intact, strength, sensation, reflexes Psych: judgement and insight appear normal, blunted mood and affect, mental status Skin: no rashes or lesions or ulcers, no induration or nodules    Data reviewed:  I have personally reviewed the recent labs and imaging studies  Pertinent Labs:   Na++  108 -> 104 -> 112 -> 114  -> 115 -> 116 -> 117 -> 118 -> 120 -> 121 K+ 3.3 CO2 18, 20,  19 Hgb 11.4 A1c 5.2   Inpatient Medications:   Scheduled Meds:   stroke: mapping our early stages of recovery book   Does not apply Once   aspirin  81 mg Oral Daily   atorvastatin  20 mg Oral Daily   Chlorhexidine Gluconate Cloth  6 each Topical Daily   pantoprazole (PROTONIX) IV  40 mg Intravenous QHS   Continuous Infusions:  sodium chloride 30 mL/hr at 05/22/19 0011     Radiological Exams on Admission: Ct Angio Head W Or Wo Contrast  Result Date: 05/21/2019 CLINICAL DATA:  Right facial droop  and aphasia. EXAM: CT ANGIOGRAPHY HEAD AND NECK TECHNIQUE: Multidetector CT imaging of the head and neck was performed using the standard protocol during bolus administration of intravenous contrast. Multiplanar CT image reconstructions and MIPs were obtained to evaluate the vascular anatomy. Carotid stenosis measurements (when applicable) are obtained utilizing NASCET criteria, using the distal internal carotid diameter as the denominator. CONTRAST:  75mL OMNIPAQUE IOHEXOL 350 MG/ML SOLN COMPARISON:  Head CT earlier same day FINDINGS: CTA NECK FINDINGS Aortic arch: Aortic atherosclerosis and ectasia. No brachiocephalic vessel origin stenosis is seen. Left vertebral artery arises directly from the arch. Right carotid system: Common carotid artery is widely patent to the bifurcation. Carotid bifurcation is widely patent without stenosis or irregularity. Cervical ICA is tortuous. 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 Left carotid system: Common carotid artery widely patent to the bifurcation. Carotid bifurcation shows mild atherosclerotic plaque but no stenosis or irregularity. Cervical ICA is tortuous but widely patent. Vertebral arteries: Both vertebral artery origins are widely patent. Left arises from the arch as noted above. Both vertebral arteries are widely patent through the cervical region to the foramen magnum. Skeleton: Mild cervical spondylosis. Other neck: No mass or adenopathy. Upper chest: Upper lungs are clear. Review of the MIP images confirms the above findings CTA HEAD FINDINGS Anterior circulation: Both internal carotid arteries are patent through the skull base and siphon regions. No siphon stenosis. The anterior and middle cerebral vessels are patent without proximal stenosis, aneurysm or vascular malformation. Anterior and middle cerebral vessels appear patent without large or medium vessel occlusion. Posterior  circulation: Both vertebral arteries are widely patent to the basilar. No basilar stenosis. Posterior circulation branch vessels are normal. Venous sinuses: Patent and normal. Anatomic variants: None significant. Delayed phase: Not performed. Review of the MIP images confirms the above findings IMPRESSION: No large or medium vessel occlusion. Markedly tortuous vessels suggesting a history of hypertension. Aortic atherosclerosis and ectasia. No carotid bifurcation stenosis or vertebral origin stenosis. Upper cervical internal carotid arteries are markedly tortuous, more so on the right than the left. 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 No significant intracranial finding. These results were communicated to Dr. Pearlean BrownieSethi at 3:00 pmon 7/7/2020by text page via the [facility] messaging system. Electronically Signed   By: Paulina FusiMark  Shogry M.D.   On: 05/21/2019 15:11   Ct Angio Neck W Or Wo Contrast  Result Date: 05/21/2019 CLINICAL DATA:  Right facial droop and aphasia. EXAM: CT ANGIOGRAPHY HEAD AND NECK TECHNIQUE: Multidetector CT imaging of the head and neck was performed using the standard protocol during bolus administration of intravenous contrast. Multiplanar CT image reconstructions and MIPs were obtained to evaluate the vascular anatomy. Carotid stenosis measurements (when applicable) are obtained utilizing NASCET criteria, using the distal internal carotid diameter as the denominator.  CONTRAST:  54mL OMNIPAQUE IOHEXOL 350 MG/ML SOLN COMPARISON:  Head CT earlier same day FINDINGS: CTA NECK FINDINGS Aortic arch: Aortic atherosclerosis and ectasia. No brachiocephalic vessel origin stenosis is seen. Left vertebral artery arises directly from the arch. Right carotid system: Common carotid artery is widely patent to the bifurcation. Carotid bifurcation is widely patent without stenosis or irregularity. Cervical ICA is tortuous. There is an aneurysm projecting posteriorly  with a wide mouth measuring 7 mm in size, in the region of tortuosity. Left carotid system: Common carotid artery widely patent to the bifurcation. Carotid bifurcation shows mild atherosclerotic plaque but no stenosis or irregularity. Cervical ICA is tortuous but widely patent. Vertebral arteries: Both vertebral artery origins are widely patent. Left arises from the arch as noted above. Both vertebral arteries are widely patent through the cervical region to the foramen magnum. Skeleton: Mild cervical spondylosis. Other neck: No mass or adenopathy. Upper chest: Upper lungs are clear. Review of the MIP images confirms the above findings CTA HEAD FINDINGS Anterior circulation: Both internal carotid arteries are patent through the skull base and siphon regions. No siphon stenosis. The anterior and middle cerebral vessels are patent without proximal stenosis, aneurysm or vascular malformation. Anterior and middle cerebral vessels appear patent without large or medium vessel occlusion. Posterior circulation: Both vertebral arteries are widely patent to the basilar. No basilar stenosis. Posterior circulation branch vessels are normal. Venous sinuses: Patent and normal. Anatomic variants: None significant. Delayed phase: Not performed. Review of the MIP images confirms the above findings IMPRESSION: No large or medium vessel occlusion. Markedly tortuous vessels suggesting a history of hypertension. Aortic atherosclerosis and ectasia. No carotid bifurcation stenosis or vertebral origin stenosis. Upper cervical internal carotid arteries are markedly tortuous, more so on the right than the left. 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 No significant intracranial finding. These results were communicated to Dr. Leonie Man at 3:00 pmon 7/7/2020by text page via the [facility] messaging system. Electronically Signed   By: Nelson Chimes M.D.   On: 05/21/2019 15:11   Ct Cervical Spine Wo  Contrast  Result Date: 05/21/2019 CLINICAL DATA:  Fall, high risk of cervical spine trauma EXAM: CT CERVICAL SPINE WITHOUT CONTRAST TECHNIQUE: Multidetector CT imaging of the cervical spine were obtained during acquisition of CTA imaging of the head and neck. Multiplanar CT image reconstructions were also generated. COMPARISON:  None FINDINGS: Alignment: 2.2 mm of anterolisthesis at C4-C5. Remaining cervical alignments normal Skull base and vertebrae: Visualized skull base intact. Vertebral body heights maintained. Multilevel facet degenerative changes. No fracture, subluxation, or bone destruction. Soft tissues and spinal canal: Prevertebral soft tissues normal thickness. Distal cervical and proximal thoracic esophagus is air-filled and mildly distended. Cervical soft tissues otherwise unremarkable. Disc levels:  No significant abnormalities Upper chest: Lung apices clear. 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 No acute cervical spine abnormalities. Electronically Signed   By: Lavonia Dana M.D.   On: 05/21/2019 16:02   Mr Brain Wo Contrast  Result Date: 05/22/2019 CLINICAL DATA:  Found on the ground yesterday. Speech disturbance. Right facial droop. Negative acute CT evaluation yesterday. EXAM: MRI HEAD WITHOUT CONTRAST TECHNIQUE: Multiplanar, multiecho pulse sequences of the brain and surrounding structures were obtained without intravenous contrast. COMPARISON:  CT studies 05/21/2019 FINDINGS: Brain: Diffusion imaging does not show any subacute or acute infarction. Brainstem and cerebellum are unremarkable except for cerebellar atrophy.  Cerebral hemispheres show atrophy with mild to moderate chronic small-vessel ischemic changes of the white matter. No cortical or large vessel territory infarction. No mass lesion, hemorrhage, hydrocephalus or extra-axial collection. Vascular: Major vessels at the base of the  brain show flow. Skull and upper cervical spine: Negative Sinuses/Orbits: Clear/normal.  Some mastoid fluid on the left. Other: None IMPRESSION: No acute finding by MRI. Age related atrophy. Mild to moderate chronic small-vessel ischemic changes of the cerebral hemispheric white matter. Electronically Signed   By: Paulina Fusi M.D.   On: 05/22/2019 13:39   Dg Hip Port Unilat With Pelvis 1v Right  Result Date: 05/22/2019 CLINICAL DATA:  Recent trauma.  Hematoma. EXAM: DG HIP (WITH OR WITHOUT PELVIS) 1V PORT RIGHT COMPARISON:  None. FINDINGS: Soft tissue edema is seen over the right hip. No fractures are seen. IMPRESSION: Soft tissue edema over the right hip.  No fractures. Electronically Signed   By: Gerome Sam III M.D   On: 05/22/2019 12:39   Dg Femur Port, 1v Right  Result Date: 05/22/2019 : Two views of the right hip No comparison History: Hematoma after tPA.  Recent trauma. TECHNIQUE: Two AP views are obtained of the right fever. FINDINGS: Soft tissue edema is seen over the right side of the pelvis and upper thigh. No fractures noted. IMPRESSION: Soft tissue edema.  No identified fracture. Electronically Signed   By: Gerome Sam III M.D   On: 05/22/2019 12:39   Ct Head Code Stroke Wo Contrast  Result Date: 05/21/2019 CLINICAL DATA:  Code stroke.  Right facial droop and aphasia. EXAM: CT HEAD WITHOUT CONTRAST TECHNIQUE: Contiguous axial images were obtained from the base of the skull through the vertex without intravenous contrast. COMPARISON:  None. FINDINGS: Brain: Age related volume loss. There chronic small-vessel ischemic changes affecting the hemispheric white matter of a mild degree. No sign of acute infarction, mass lesion, hemorrhage, hydrocephalus or extra-axial collection. Vascular: No convincing hyperdense vessel. One could question a hyperdense focus in the left M1 segment region. Skull: Negative Sinuses/Orbits: Clear/normal Other: None ASPECTS (Alberta Stroke Program Early CT Score)  - Ganglionic level infarction (caudate, lentiform nuclei, internal capsule, insula, M1-M3 cortex): 7 - Supraganglionic infarction (M4-M6 cortex): 3 Total score (0-10 with 10 being normal): 10 IMPRESSION: 1. Mild chronic small-vessel change of the hemispheric white matter. No acute infarction seen. No hemorrhage. One could question a small hyperdense focus in the left M1 segment. 2. ASPECTS is 10. 3. These results were communicated to Dr. Pearlean Brownie at 2:55 pmon 7/7/2020by text page via the [facility] messaging system. Electronically Signed   By: Paulina Fusi M.D.   On: 05/21/2019 14:57    Impression/Recommendations Principal Problem:   Hyponatremia Active Problems:   Stroke (HCC)   Hemorrhagic shock (HCC)   Essential hypertension   Dyslipidemia   Hypokalemia  Hyponatremia -Etiology appears to be hypovolemic hyponatremia; patient with question of decreased PO intake.   -Sodium is slowly increasing with current treatment, would continue for now at slightly increased rate as she appears no longer at increased risk for overcorrection/central pontine myelinolysis -I do not currently appreciate medications on her home list that would increase the risk for this condition  CVA -Initial concern was CVA and she was given tPA with improvement but she then developed a large right hip hematoma and became hypotensive and so the tPA was reversed -Stroke team is continuing to follow  Hemorrhagic shock -Thought to be related to R hip hematoma  -Now stable with plan for  transfusion 2 units PRBC -PCCM has recommended hip/knee imaging -Holding ASA  HTN -Lisinopril held with shock overnight -Likely ok to resume soon  HLD -Continue Lipitor  Hypokalemia -Replete -Recheck in AM   Thank you for this consultation.  [facility] team will follow the patient with you.   Time Spent: 55 minutes  Jonah BlueJennifer Sapna Padron M.D. [facility] 05/22/2019, 5:17 PM

## 2019-05-22 NOTE — Evaluation (Signed)
Physical Therapy Evaluation Patient Details Name: Anna Castaneda MRN: [id number] DOB: [birth year] Today's Date: 05/22/2019   History of Present Illness  Pt is a 73 y.o. F with significant PMH of myocardial infarction, osteoporosis, who was brought in as code stroke. She lives in independent living facility where she was found down at her mailbox. 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 CT unremarkable. Administered TPA. Shortly after arrival to ICU, became hypotensive and developed large right hematoma; tPA reversed.   Clinical Impression  Pt admitted with above. Prior to admission, pt lives alone in independent living facility portion of [address], exercises 3 days/week. On PT evaluation, pt presenting with decreased functional mobility secondary to balance impairments and decreased awareness. Ambulating limited hallway distances with no assistive device, demonstrating dynamic instability, one episode of anterior loss of balance requiring maxA to correct. BP 118/78. Pt will need increased assist for d/c home based on presentation as high fall risk.    Follow Up Recommendations SNF;Supervision/Assistance - 24 hour  (respite care at [facility]?)   Equipment Recommendations  Rolling walker with 5" wheels    Recommendations for Other Services       Precautions / Restrictions Precautions Precautions: Fall Restrictions Weight Bearing Restrictions: No      Mobility  Bed Mobility Overal bed mobility: Modified Independent                Transfers Overall transfer level: Needs assistance Equipment used: None Transfers: Sit to/from Stand Sit to Stand: Min guard            Ambulation/Gait Ambulation/Gait assistance: Min assist;Max assist Gait Distance (Feet): 300 Feet Assistive device: None Gait Pattern/deviations: Step-through pattern;Decreased stride length;Drifts right/left Gait velocity: decreased Gait velocity  interpretation: <1.8 ft/sec, indicate of risk for recurrent falls General Gait Details: Pt requiring minA for ambulation, up to maxA to correct anterior LOB from toilet to sink. Decreased reciprocal arm swing noted, improved stability with increased distance.   Stairs            Wheelchair Mobility    Modified Rankin (Stroke Patients Only) Modified Rankin (Stroke Patients Only) Pre-Morbid Rankin Score: No symptoms Modified Rankin: Moderately severe disability     Balance Overall balance assessment: Needs assistance Sitting-balance support: Feet supported Sitting balance-Leahy Scale: Good     Standing balance support: No upper extremity supported;During functional activity Standing balance-Leahy Scale: Fair                               Pertinent Vitals/Pain Pain Assessment: No/denies pain    Home Living Family/patient expects to be discharged to:: Private residence Living Arrangements: Alone   Type of Home: Independent living facility       Home Layout: One level Home Equipment: None      Prior Function Level of Independence: Independent         Comments: Lives in ILF portion of [place]. Cooks, drives, grocery shops. Does Zoom "PT" three times a week for exercise      Hand Dominance        Extremity/Trunk Assessment   Upper Extremity Assessment Upper Extremity Assessment: Defer to OT evaluation    Lower Extremity Assessment Lower Extremity Assessment: Overall WFL for tasks assessed    Cervical / Trunk Assessment Cervical / Trunk Assessment: Kyphotic  Communication   Communication: No difficulties  Cognition Arousal/Alertness: Awake/alert Behavior During Therapy: [facility] for  tasks assessed/performed Overall Cognitive Status: Impaired/Different from baseline Area of Impairment: Awareness;Safety/judgement                         Safety/Judgement: Decreased awareness of deficits Awareness: Intellectual   General  Comments: Pt with decreased awareness of deficits, not recognizing or cognizant of gross imbalance. States she only wears glasses for reading but unable to tie shoes stating, "I can't see them this way."       General Comments      Exercises     Assessment/Plan    PT Assessment Patient needs continued PT services  PT Problem List Decreased strength;Decreased balance;Decreased mobility;Decreased cognition;Decreased safety awareness       PT Treatment Interventions DME instruction;Gait training;Functional mobility training;Therapeutic activities;Therapeutic exercise;Balance training;Patient/family education    PT Goals (Current goals can be found in the Care Plan section)  Acute Rehab PT Goals Patient Stated Goal: "go back to [facility] PT Goal Formulation: With patient Time For Goal Achievement: 06/05/19 Potential to Achieve Goals: Good    Frequency Min 3X/week   Barriers to discharge        Co-evaluation PT/OT/SLP Co-Evaluation/Treatment: Yes Reason for Co-Treatment: For patient/therapist safety;To address functional/ADL transfers PT goals addressed during session: Mobility/safety with mobility         AM-PAC PT "6 Clicks" Mobility  Outcome Measure Help needed turning from your back to your side while in a flat bed without using bedrails?: None Help needed moving from lying on your back to sitting on the side of a flat bed without using bedrails?: None Help needed moving to and from a bed to a chair (including a wheelchair)?: A Little Help needed standing up from a chair using your arms (e.g., wheelchair or bedside chair)?: A Little Help needed to walk in hospital room?: A Little Help needed climbing 3-5 steps with a railing? : A Lot 6 Click Score: 19    End of Session Equipment Utilized During Treatment: Gait belt Activity Tolerance: Patient tolerated treatment well Patient left: in chair;with call bell/phone within reach;with chair alarm set Nurse  Communication: Mobility status PT Visit Diagnosis: Unsteadiness on feet (R26.81);Difficulty in walking, not elsewhere classified (R26.2)    Time: 1324-4010 PT Time Calculation (min) (ACUTE ONLY): 28 min   Charges:   PT Evaluation $PT Eval Moderate Complexity: 1 Mod         Ellamae Sia, [place], DPT Acute Rehabilitation Services Pager [phone] Office [phone]   Willy Eddy 05/22/2019, 2:52 PM

## 2019-05-22 NOTE — Progress Notes (Signed)
CRITICAL VALUE ALERT  Critical Value: Na +116   Date & Time Notied:  0310 05/22/19   Provider Notified: Warren Lacy  Orders Received/Actions taken: Stop NS infusion.

## 2019-05-22 NOTE — Progress Notes (Signed)
[facility] Progress Note Patient Name: Bela Bonaparte DOB: Dec 30, 1945 MRN: [id number]   Date of Service  05/22/2019  HPI/Events of Note  Hyponatremia - Na+ 115 --> 116.   eICU Interventions  Will hold 0.9 NaCl IV infusion.      Intervention Category Major Interventions: Electrolyte abnormality - evaluation and management  Ebonique Hallstrom [place] 05/22/2019, 3:13 AM
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
## 2019-05-23 ENCOUNTER — Ambulatory Visit: Payer: Medicare Other | Admitting: Physical Therapy

## 2019-05-23 DIAGNOSIS — R569 Unspecified convulsions: Principal | ICD-10-CM

## 2019-05-23 LAB — BASIC METABOLIC PANEL
Anion gap: 8 (ref 5–15)
Anion gap: 10 (ref 5–15)
BUN: 8 mg/dL (ref 8–23)
BUN: 7 mg/dL — ABNORMAL LOW (ref 8–23)
CO2: 22 mmol/L (ref 22–32)
CO2: 19 mmol/L — ABNORMAL LOW (ref 22–32)
Calcium: 8.3 mg/dL — ABNORMAL LOW (ref 8.9–10.3)
Calcium: 8.2 mg/dL — ABNORMAL LOW (ref 8.9–10.3)
Chloride: 97 mmol/L — ABNORMAL LOW (ref 98–111)
Chloride: 98 mmol/L (ref 98–111)
Creatinine, Ser: 0.72 mg/dL (ref 0.44–1.00)
Creatinine, Ser: 0.63 mg/dL (ref 0.44–1.00)
GFR calc Af Amer: >60 mL/min (ref >60)
GFR calc Af Amer: >60 mL/min (ref >60)
GFR calc non Af Amer: >60 mL/min (ref >60)
GFR calc non Af Amer: >60 mL/min (ref >60)
Glucose, Bld: 157 mg/dL — ABNORMAL HIGH (ref 70–99)
Glucose, Bld: 125 mg/dL — ABNORMAL HIGH (ref 70–99)
Potassium: 3.3 mmol/L — ABNORMAL LOW (ref 3.5–5.1)
Potassium: 3.9 mmol/L (ref 3.5–5.1)
Sodium: 127 mmol/L — ABNORMAL LOW (ref 135–145)
Sodium: 127 mmol/L — ABNORMAL LOW (ref 135–145)

## 2019-05-23 LAB — PREPARE CRYOPRECIPITATE
Unit division: 0
Unit division: 0

## 2019-05-23 LAB — CBC
HCT: 34.2 % — ABNORMAL LOW (ref 36.0–46.0)
Hemoglobin: 12.3 g/dL (ref 12.0–15.0)
MCH: 32.5 pg (ref 26.0–34.0)
MCHC: 36 g/dL (ref 30.0–36.0)
MCV: 90.2 fL (ref 80.0–100.0)
Platelets: 409 10*3/uL — ABNORMAL HIGH (ref 150–400)
RBC: 3.79 MIL/uL — ABNORMAL LOW (ref 3.87–5.11)
RDW: 12.5 % (ref 11.5–15.5)
WBC: 6.2 10*3/uL (ref 4.0–10.5)
nRBC: 0 % (ref 0.0–0.2)

## 2019-05-23 LAB — BPAM CRYOPRECIPITATE
Blood Product Expiration Date: 202007080200
Blood Product Expiration Date: 202007080200
Unit Type and Rh: 6200
Unit Type and Rh: 6200

## 2019-05-23 LAB — MAGNESIUM: Magnesium: 2.5 mg/dL — ABNORMAL HIGH (ref 1.7–2.4)

## 2019-05-23 MED ORDER — SODIUM CHLORIDE 0.9 % IV SOLN
INTRAVENOUS | Status: AC
  Administered 2019-05-23 – 2019-05-24 (×2): via INTRAVENOUS

## 2019-05-23 MED ORDER — PANTOPRAZOLE SODIUM 40 MG PO TBEC
40.0000 mg | DELAYED_RELEASE_TABLET | Freq: Every day | ORAL | Status: DC
  Administered 2019-05-24: 40 mg via ORAL
  Filled 2019-05-23: qty 1

## 2019-05-23 MED ORDER — LEVETIRACETAM ER 500 MG PO TB24
500.0000 mg | ORAL_TABLET | Freq: Every day | ORAL | Status: DC
  Administered 2019-05-23 – 2019-05-24 (×2): 500 mg via ORAL
  Filled 2019-05-23 (×4): qty 1

## 2019-05-23 MED ORDER — POTASSIUM CHLORIDE CRYS ER 20 MEQ PO TBCR
40.0000 meq | EXTENDED_RELEASE_TABLET | ORAL | Status: AC
  Administered 2019-05-23 (×2): 40 meq via ORAL
  Filled 2019-05-23: qty 4
  Filled 2019-05-23: qty 2

## 2019-05-23 NOTE — Progress Notes (Signed)
PROGRESS NOTE    Anna Castaneda  LNL:[id number] DOB: March 11, 1946 DOA: 05/21/2019 PCP: Tracie Harrier, MD   Brief Narrative:  73 year old with history of GERD, CAD, type Assubel cardiomyopathy presented to the hospital with syncope with concerns for possible CVA.  She received TPA unfortunately was complicated by right-sided hip hematoma requiring reversal of TPA.  Also noted to have significant hyponatremia with sodium as low as 104.   Assessment & Plan:   Principal Problem:   Hyponatremia Active Problems:   Stroke ([place])   Hemorrhagic shock (HCC)   Essential hypertension   Dyslipidemia   Hypokalemia  Severe hyponatremia secondary to hypovolemia, improved Hypokalemia -She has been getting normal saline, this morning sodium is improved to 127.  Rate of correction has been appropriate.  I have advised her to continue oral hydration at home as well after her discharge and try using fluids with electrolytes such as Gatorade.  She understands this. -Replete potassium  Acute lacunar CVA status post TPA complicated by right hip hematoma - Stroke team following.  Currently hemodynamically stable.  Hemoglobin remained stable as well.  There is also a question of seizure-like activity, defer further decision to neurology team.  Aspirin is currently on hold for this.  Hemorrhagic shock secondary to right hip hematoma Acute blood loss anemia, stable. - Initially required 2 units of PRBC.  Aspirin is currently on hold.  Essential hypertension -Hold antihypertensives.  Hyperlipidemia -Statin.  At this point sodium levels have stabilized, disposition planning in place.  After patient leaves, she will need repeat lab work in about 3-5 days to ensure sodium levels have remained stable.  Otherwise while patient is here, we will continue to follow her.  Subjective: Patient is sitting up in the chair, does not have any complaints today.  Overall feels well.  Review of Systems Otherwise  negative except as per HPI, including: General: Denies fever, chills, night sweats or unintended weight loss. Resp: Denies cough, wheezing, shortness of breath. Cardiac: Denies chest pain, palpitations, orthopnea, paroxysmal nocturnal dyspnea. GI: Denies abdominal pain, nausea, vomiting, diarrhea or constipation GU: Denies dysuria, frequency, hesitancy or incontinence MS: Denies muscle aches, joint pain or swelling Neuro: Denies headache, neurologic deficits (focal weakness, numbness, tingling), abnormal gait Psych: Denies anxiety, depression, SI/HI/AVH Skin: Denies new rashes or lesions ID: Denies sick contacts, exotic exposures, travel  Objective: Vitals:   05/23/19 0115 05/23/19 0400 05/23/19 0800 05/23/19 0844  BP: 107/60   (!) 95/58  Pulse: 66  87 79  Resp: 13  11 16   Temp:  97.8 F (36.6 C) 98.1 F (36.7 C)   TempSrc:  Oral Oral   SpO2: 100%  100% 99%  Weight:        Intake/Output Summary (Last 24 hours) at 05/23/2019 1000 Last data filed at 05/23/2019 0800 Gross per 24 hour  Intake 1100.01 ml  Output --  Net 1100.01 ml   Filed Weights   05/21/19 1503  Weight: 55.8 kg    Examination:  General exam: Appears calm and comfortable, dry mouth. Respiratory system: Clear to auscultation. Respiratory effort normal. Cardiovascular system: S1 & S2 heard, RRR. No JVD, murmurs, rubs, gallops or clicks. No pedal edema. Gastrointestinal system: Abdomen is nondistended, soft and nontender. No organomegaly or masses felt. Normal bowel sounds heard. Central nervous system: Alert and oriented. No focal neurological deficits. Extremities: Symmetric 5 x 5 power. Skin: No rashes, lesions or ulcers Psychiatry: Judgement and insight appear normal. Mood & affect appropriate.     Data Reviewed:  CBC: Recent Labs  Lab 05/21/19 1440 05/21/19 1445 05/21/19 1933 05/22/19 0455 05/23/19 0903  WBC 8.8  --   --  8.4 6.2  NEUTROABS 7.2  --   --   --   --   HGB 13.2 14.3 11.6* 11.4*  12.3  HCT 36.4 42.0 30.9* 30.4* 34.2*  MCV 88.1  --   --  86.9 90.2  PLT 375  --   --  367 409*   Basic Metabolic Panel: Recent Labs  Lab 05/21/19 1558  05/22/19 0639 05/22/19 0840 05/22/19 1032 05/22/19 1410 05/23/19 0903  NA  --    < > 118* 118* 120* 121* 127*  K  --    < > 3.3* 3.4* 3.5 3.3* 3.3*  CL  --    < > 89* 90* 91* 90* 97*  CO2  --    < > 18* 20* 20* 19* 22  GLUCOSE  --    < > 94 96 93 113* 157*  BUN  --    < > 5* <5* <5* <5* 8  CREATININE  --    < > 0.66 0.57 0.62 0.58 0.72  CALCIUM  --    < > 7.5* 7.7* 7.7* 8.0* 8.3*  MG 2.2  --   --   --   --   --  2.5*   < > = values in this interval not displayed.   GFR: CrCl cannot be calculated (Unknown ideal weight.). Liver Function Tests: Recent Labs  Lab 05/21/19 1440 05/21/19 1933  AST 35 29  ALT 24 21  ALKPHOS 48 38  BILITOT 1.2 1.2  PROT 6.9 5.5*  ALBUMIN 4.4 3.5   No results for input(s): LIPASE, AMYLASE in the last 168 hours. No results for input(s): AMMONIA in the last 168 hours. Coagulation Profile: Recent Labs  Lab 05/21/19 1440  INR 1.1   Cardiac Enzymes: No results for input(s): CKTOTAL, CKMB, CKMBINDEX, TROPONINI in the last 168 hours. BNP (last 3 results) No results for input(s): PROBNP in the last 8760 hours. HbA1C: Recent Labs    05/22/19 0455  HGBA1C 5.2   CBG: Recent Labs  Lab 05/21/19 1443  GLUCAP 169*   Lipid Profile: Recent Labs    05/22/19 0455  CHOL 126  HDL 74  LDLCALC 45  TRIG 37  CHOLHDL 1.7   Thyroid Function Tests: Recent Labs    05/21/19 1550  TSH 1.368   Anemia Panel: No results for input(s): VITAMINB12, FOLATE, FERRITIN, TIBC, IRON, RETICCTPCT in the last 72 hours. Sepsis Labs: No results for input(s): PROCALCITON, LATICACIDVEN in the last 168 hours.  Recent Results (from the past 240 hour(s))  SARS Coronavirus 2 (CEPHEID - Performed in [facility] hospital lab), Hosp Order     Status: None   Collection Time: 05/21/19  3:20 PM   Specimen:  Nasopharyngeal Swab  Result Value Ref Range Status   SARS Coronavirus 2 NEGATIVE NEGATIVE Final    Comment: (NOTE) If result is NEGATIVE SARS-CoV-2 target nucleic acids are NOT DETECTED. The SARS-CoV-2 RNA is generally detectable in upper and lower  respiratory specimens during the acute phase of infection. The lowest  concentration of SARS-CoV-2 viral copies this assay can detect is 250  copies / mL. A negative result does not preclude SARS-CoV-2 infection  and should not be used as the sole basis for treatment or other  patient management decisions.  A negative result may occur with  improper specimen collection / handling, submission of specimen other  than nasopharyngeal swab, presence of viral mutation(s) within the  areas targeted by this assay, and inadequate number of viral copies  (<250 copies / mL). A negative result must be combined with clinical  observations, patient history, and epidemiological information. If result is POSITIVE SARS-CoV-2 target nucleic acids are DETECTED. The SARS-CoV-2 RNA is generally detectable in upper and lower  respiratory specimens dur ing the acute phase of infection.  Positive  results are indicative of active infection with SARS-CoV-2.  Clinical  correlation with patient history and other diagnostic information is  necessary to determine patient infection status.  Positive results do  not rule out bacterial infection or co-infection with other viruses. If result is PRESUMPTIVE POSTIVE SARS-CoV-2 nucleic acids MAY BE PRESENT.   A presumptive positive result was obtained on the submitted specimen  and confirmed on repeat testing.  While 2019 novel coronavirus  (SARS-CoV-2) nucleic acids may be present in the submitted sample  additional confirmatory testing may be necessary for epidemiological  and / or clinical management purposes  to differentiate between  SARS-CoV-2 and other Sarbecovirus currently known to infect humans.  If clinically  indicated additional testing with an alternate test  methodology [phone]([id number]) is advised. The SARS-CoV-2 RNA is generally  detectable in upper and lower respiratory sp ecimens during the acute  phase of infection. The expected result is Negative. Fact Sheet for Patients:  BoilerBrush.com.cyhttps://www.fda.gov/media/136312/download Fact Sheet for Healthcare Providers: https://pope.com/https://www.fda.gov/media/136313/download This test is not yet approved or cleared by the [place] FDA and has been authorized for detection and/or diagnosis of SARS-CoV-2 by FDA under an Emergency Use Authorization (EUA).  This EUA will remain in effect (meaning this test can be used) for the duration of the COVID-19 declaration under Section 564(b)(1) of the Act, 21 U.S.C. section 360bbb-3(b)(1), unless the authorization is terminated or revoked sooner. Performed at [facility], [address] [place], [place] [address]   MRSA PCR Screening     Status: None   Collection Time: 05/22/19  3:25 AM   Specimen: Nasal Mucosa; Nasopharyngeal  Result Value Ref Range Status   MRSA by PCR NEGATIVE NEGATIVE Final    Comment:        The GeneXpert MRSA Assay (FDA approved for NASAL specimens only), is one component of a comprehensive MRSA colonization surveillance program. It is not intended to diagnose MRSA infection nor to guide or monitor treatment for MRSA infections. Performed at [facility], [address] [place], [place] [address]          Radiology Studies: Ct Angio Head W Or Wo Contrast  Result Date: 05/21/2019 CLINICAL DATA:  Right facial droop and aphasia. EXAM: CT ANGIOGRAPHY HEAD AND NECK TECHNIQUE: Multidetector CT imaging of the head and neck was performed using the standard protocol during bolus administration of intravenous contrast. Multiplanar CT image reconstructions and MIPs were obtained to evaluate the vascular anatomy. Carotid stenosis measurements (when applicable) are obtained utilizing NASCET  criteria, using the distal internal carotid diameter as the denominator. CONTRAST:  75mL OMNIPAQUE IOHEXOL 350 MG/ML SOLN COMPARISON:  Head CT earlier same day FINDINGS: CTA NECK FINDINGS Aortic arch: Aortic atherosclerosis and ectasia. No brachiocephalic vessel origin stenosis is seen. Left vertebral artery arises directly from the arch. Right carotid system: Common carotid artery is widely patent to the bifurcation. Carotid bifurcation is widely patent without stenosis or irregularity. Cervical ICA is tortuous. There is an aneurysm projecting posteriorly with a wide mouth measuring 7 mm in size, in the region  of tortuosity. Left carotid system: Common carotid artery widely patent to the bifurcation. Carotid bifurcation shows mild atherosclerotic plaque but no stenosis or irregularity. Cervical ICA is tortuous but widely patent. Vertebral arteries: Both vertebral artery origins are widely patent. Left arises from the arch as noted above. Both vertebral arteries are widely patent through the cervical region to the foramen magnum. Skeleton: Mild cervical spondylosis. Other neck: No mass or adenopathy. Upper chest: Upper lungs are clear. Review of the MIP images confirms the above findings CTA HEAD FINDINGS Anterior circulation: Both internal carotid arteries are patent through the skull base and siphon regions. No siphon stenosis. The anterior and middle cerebral vessels are patent without proximal stenosis, aneurysm or vascular malformation. Anterior and middle cerebral vessels appear patent without large or medium vessel occlusion. Posterior circulation: Both vertebral arteries are widely patent to the basilar. No basilar stenosis. Posterior circulation branch vessels are normal. Venous sinuses: Patent and normal. Anatomic variants: None significant. Delayed phase: Not performed. Review of the MIP images confirms the above findings IMPRESSION: No large or medium vessel occlusion. Markedly tortuous vessels  suggesting a history of hypertension. Aortic atherosclerosis and ectasia. No carotid bifurcation stenosis or vertebral origin stenosis. Upper cervical internal carotid arteries are markedly tortuous, more so on the right than the left. 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 No significant intracranial finding. These results were communicated to Dr. Sethi at 3:00 pmon 7/7/2020by text page via the AMION messaging system. Electronically Signed   By: Mark  Shogry M.D.   On: 05/21/2019 15:11   Ct Angio Neck W Or Wo Contrast  Result Date: 05/21/2019 CLINICAL DATA:  Right facial droop and aphasia. EXAM: CT ANGIOGRAPHY HEAD AND NECK TECHNIQUE: Multidetector CT imaging of the head and neck was performed using the standard protocol during bolus administration of intravenous contrast. Multiplanar CT image reconstructions and MIPs were obtained to evaluate the vascular anatomy. Carotid stenosis measurements (when applicable) are obtained utilizing NASCET criteria, using the distal internal carotid diameter as the deno669-5[place]ell8847[place] Arabell9([phone]t ThoArabell867([phone] Medic[facility]Arabell(9531 3Lake [phone](33564MulArabell23([phone] 7[place] Arabell6575-8Essentia HArabell85623 6GardenArabell314-5The MackoArabell9570-1Sain Francis HoArabell(858316-8[place] Arabell9410 0LakeviArabell([phone] 8CaldweArabell9506-4[place]adaArabell(4320-2[facility] DominicanArabell4941[place] Arabell804 [phone] [email or web address]:  Head CT earlier same day FINDINGS: CTA NECK FINDINGS Aortic arch: Aortic atherosclerosis and ectasia. No brachiocephalic vessel origin stenosis is seen. Left vertebral artery arises directly from the arch. Right carotid system: Common carotid artery is widely patent to the bifurcation. Carotid bifurcation is widely patent without stenosis or irregularity. Cervical ICA is tortuous. 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 Left carotid system: Common carotid artery widely patent to the bifurcation. Carotid bifurcation shows mild atherosclerotic plaque but no stenosis or irregularity. Cervical ICA is tortuous but widely patent. Vertebral arteries: Both vertebral artery origins are widely patent. Left arises from the  arch as noted above. Both vertebral arteries are widely patent through the cervical region to the foramen magnum. Skeleton: Mild cervical spondylosis. Other neck: No mass or adenopathy. Upper chest: Upper lungs are clear. Review of the MIP images confirms the above findings CTA HEAD FINDINGS Anterior circulation: Both internal carotid arteries are patent through the skull base and siphon regions. No siphon stenosis. The anterior and middle cerebral vessels are patent without proximal stenosis, aneurysm or vascular malformation. Anterior and middle cerebral vessels appear patent without large or medium vessel occlusion. Posterior circulation: Both vertebral arteries are widely patent to the basilar. No basilar stenosis. Posterior circulation branch vessels are normal. Venous sinuses: Patent and normal. Anatomic variants:  None significant. Delayed phase: Not performed. Review of the MIP images confirms the above findings IMPRESSION: No large or medium vessel occlusion. Markedly tortuous vessels suggesting a history of hypertension. Aortic atherosclerosis and ectasia. No carotid bifurcation stenosis or vertebral origin stenosis. Upper cervical internal carotid arteries are markedly tortuous, more so on the right than the left. 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 No significant intracranial finding. These results were communicated to Dr. Pearlean BrownieSethi at 3:00 pmon 7/7/2020by text page via the [facility] messaging system. Electronically Signed   By: Paulina FusiMark  Shogry M.D.   On: 05/21/2019 15:11   Ct Cervical Spine Wo Contrast  Result Date: 05/21/2019 CLINICAL DATA:  Fall, high risk of cervical spine trauma EXAM: CT CERVICAL SPINE WITHOUT CONTRAST TECHNIQUE: Multidetector CT imaging of the cervical spine were obtained during acquisition of CTA imaging of the head and neck. Multiplanar CT image reconstructions were also generated. COMPARISON:  None FINDINGS: Alignment: 2.2 mm of  anterolisthesis at C4-C5. Remaining cervical alignments normal Skull base and vertebrae: Visualized skull base intact. Vertebral body heights maintained. Multilevel facet degenerative changes. No fracture, subluxation, or bone destruction. Soft tissues and spinal canal: Prevertebral soft tissues normal thickness. Distal cervical and proximal thoracic esophagus is air-filled and mildly distended. Cervical soft tissues otherwise unremarkable. Disc levels:  No significant abnormalities Upper chest: Lung apices clear. 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 No acute cervical spine abnormalities. Electronically Signed   By: Ulyses SouthwardMark  Boles M.D.   On: 05/21/2019 16:02   Mr Brain Wo Contrast  Result Date: 05/22/2019 CLINICAL DATA:  Found on the ground yesterday. Speech disturbance. Right facial droop. Negative acute CT evaluation yesterday. EXAM: MRI HEAD WITHOUT CONTRAST TECHNIQUE: Multiplanar, multiecho pulse sequences of the brain and surrounding structures were obtained without intravenous contrast. COMPARISON:  CT studies 05/21/2019 FINDINGS: Brain: Diffusion imaging does not show any subacute or acute infarction. Brainstem and cerebellum are unremarkable except for cerebellar atrophy. Cerebral hemispheres show atrophy with mild to moderate chronic small-vessel ischemic changes of the white matter. No cortical or large vessel territory infarction. No mass lesion, hemorrhage, hydrocephalus or extra-axial collection. Vascular: Major vessels at the base of the brain show flow. Skull and upper cervical spine: Negative Sinuses/Orbits: Clear/normal.  Some mastoid fluid on the left. Other: None IMPRESSION: No acute finding by MRI. Age related atrophy. Mild to moderate chronic small-vessel ischemic changes of the cerebral hemispheric white matter. Electronically Signed   By: Paulina FusiMark  Shogry M.D.   On: 05/22/2019 13:39   Dg Hip  Port Unilat With Pelvis 1v Right  Result Date: 05/22/2019 CLINICAL DATA:  Recent trauma.  Hematoma. EXAM: DG HIP (WITH OR WITHOUT PELVIS) 1V PORT RIGHT COMPARISON:  None. FINDINGS: Soft tissue edema is seen over the right hip. No fractures are seen. IMPRESSION: Soft tissue edema over the right hip.  No fractures. Electronically Signed   By: Gerome Samavid  Williams III M.D   On: 05/22/2019 12:39   Dg Femur Port, 1v Right  Result Date: 05/22/2019 : Two views of the right hip No comparison History: Hematoma after tPA.  Recent trauma. TECHNIQUE: Two AP views are obtained of the right fever. FINDINGS: Soft tissue edema is seen over the right side of the pelvis and upper thigh. No fractures noted. IMPRESSION: Soft tissue edema.  No identified fracture. Electronically Signed   By: Gerome Samavid  Williams III M.D   On:  05/22/2019 12:39   Ct Head Code Stroke Wo Contrast  Result Date: 05/21/2019 CLINICAL DATA:  Code stroke.  Right facial droop and aphasia. EXAM: CT HEAD WITHOUT CONTRAST TECHNIQUE: Contiguous axial images were obtained from the base of the skull through the vertex without intravenous contrast. COMPARISON:  None. FINDINGS: Brain: Age related volume loss. There chronic small-vessel ischemic changes affecting the hemispheric white matter of a mild degree. No sign of acute infarction, mass lesion, hemorrhage, hydrocephalus or extra-axial collection. Vascular: No convincing hyperdense vessel. One could question a hyperdense focus in the left M1 segment region. Skull: Negative Sinuses/Orbits: Clear/normal Other: None ASPECTS (Alberta Stroke Program Early CT Score) - Ganglionic level infarction (caudate, lentiform nuclei, internal capsule, insula, M1-M3 cortex): 7 - Supraganglionic infarction (M4-M6 cortex): 3 Total score (0-10 with 10 being normal): 10 IMPRESSION: 1. Mild chronic small-vessel change of the hemispheric white matter. No acute infarction seen. No hemorrhage. One could question a small hyperdense focus in the  left M1 segment. 2. ASPECTS is 10. 3. These results were communicated to Dr. Pearlean Brownie at 2:55 pmon 7/7/2020by text page via the [facility] messaging system. Electronically Signed   By: Paulina Fusi M.D.   On: 05/21/2019 14:57        Scheduled Meds:   stroke: mapping our early stages of recovery book   Does not apply Once   aspirin  81 mg Oral Daily   atorvastatin  20 mg Oral Daily   Chlorhexidine Gluconate Cloth  6 each Topical Daily   levETIRAcetam  500 mg Oral Daily   pantoprazole  40 mg Oral Daily   potassium chloride  40 mEq Oral Q4H   Continuous Infusions:  sodium chloride       LOS: 2 days   Time spent= 25 mins    Lei Dower Joline Maxcy, MD [facility]  If 7PM-7AM, please contact night-coverage [email or web address] 05/23/2019, 10:00 AM

## 2019-05-23 NOTE — Progress Notes (Signed)
Pt reports she drinks large amount of water daily to help with constipation. Pt also keeps very low sodium diet due to deceased husbands "heart issues" and states she continues to maintain low sodium diet.

## 2019-05-23 NOTE — [place] FL2 (Addendum)
[place] MEDICAID FL2 LEVEL OF CARE SCREENING TOOL     IDENTIFICATION  Patient Name: Anna Castaneda Birthdate: 08/02/1946 Sex: female Admission Date (Current Location): 05/21/2019  [facility] and [place] Number:  Chiropodist and Address:  [facility]. [facility], [address], [place], [place] [address]      Provider Number: [id number]  Attending Physician Name and Address:  Micki Riley, MD  Relative Name and Phone Number:       Current Level of Care: Hospital Recommended Level of Care: Skilled Nursing Facility Prior Approval Number:    Date Approved/Denied:   PASRR Number: [id number]  Discharge Plan: SNF    Current Diagnoses: Patient Active Problem List   Diagnosis Date Noted  . Hemorrhagic shock (HCC) 05/22/2019  . Essential hypertension 05/22/2019  . Dyslipidemia 05/22/2019  . Hypokalemia 05/22/2019  . Stroke (HCC) 05/21/2019  . Hyponatremia     Orientation RESPIRATION BLADDER Height & Weight     Self, Time, Situation, Place  Normal Continent Weight: 55.8 kg Height:     BEHAVIORAL SYMPTOMS/MOOD NEUROLOGICAL BOWEL NUTRITION STATUS      Continent Diet(regular)  AMBULATORY STATUS COMMUNICATION OF NEEDS Skin   Limited Assist Verbally (Abrasions to: arms/ hands/ knees/ hips and elbows--Ecchymosis to: rt hip, knee and back)                       Personal Care Assistance Level of Assistance  Bathing, Feeding, Dressing Bathing Assistance: Limited assistance Feeding assistance: Independent Dressing Assistance: Independent     Functional Limitations Info  Sight, Hearing, Speech Sight Info: Adequate Hearing Info: Adequate Speech Info: Adequate    SPECIAL CARE FACTORS FREQUENCY  PT (By licensed PT), OT (By licensed OT)     PT Frequency: 5x/wk OT Frequency: 5x/wk            Contractures Contractures Info: Present    Additional Factors Info  Code Status, Allergies Code Status Info: Full Allergies Info: Lactose            Current Medications (05/23/2019):  This is the current hospital active medication list Current Facility-Administered Medications  Medication Dose Route Frequency Provider Last Rate Last Dose  .  stroke: mapping our early stages of recovery book   Does not apply Once Annie Main L, NP      . 0.9 %  sodium chloride infusion   Intravenous Continuous Amin, Loura Halt, MD 75 mL/hr at 05/23/19 1104    . acetaminophen (TYLENOL) tablet 650 mg  650 mg Oral Q4H PRN Layne Benton, NP       Or  . acetaminophen (TYLENOL) solution 650 mg  650 mg Per Tube Q4H PRN Layne Benton, NP       Or  . acetaminophen (TYLENOL) suppository 650 mg  650 mg Rectal Q4H PRN Layne Benton, NP      . aspirin chewable tablet 81 mg  81 mg Oral Daily Annie Main L, NP   81 mg at 05/23/19 0928  . atorvastatin (LIPITOR) tablet 20 mg  20 mg Oral Daily Annie Main L, NP   20 mg at 05/22/19 1747  . Chlorhexidine Gluconate Cloth 2 % PADS 6 each  6 each Topical Daily Layne Benton, NP   6 each at 05/22/19 1750  . hydrALAZINE (APRESOLINE) injection 20 mg  20 mg Intravenous Q8H PRN Annie Main L, NP      . levETIRAcetam (KEPPRA XR) 24 hr tablet 500 mg  500 mg Oral Daily Burnetta Sabin L, NP   500 mg at 05/23/19 1154  . pantoprazole (PROTONIX) EC tablet 40 mg  40 mg Oral Daily Gillian Scarce, [facility]      . potassium chloride SA (K-DUR) CR tablet 40 mEq  40 mEq Oral Q4H Amin, Ankit Chirag, MD   40 mEq at 05/23/19 1104  . senna-docusate (Senokot-S) tablet 1 tablet  1 tablet Oral QHS PRN Donzetta Starch, NP         Discharge Medications: Please see discharge summary for a list of discharge medications.  Relevant Imaging Results:  Relevant Lab Results:   Additional Information SS#: [id number]  Pollie Friar, RN  I have personally obtained history,examined this patient, reviewed notes, independently viewed imaging studies, participated in medical decision making and plan of care.ROS completed by me personally and  pertinent positives fully documented  I have made any additions or clarifications directly to the above note. Agree with note above.   Antony Contras, MD Medical Director [facility] Stroke Center Pager: [phone] 05/23/2019 3:56 PM

## 2019-05-23 NOTE — Progress Notes (Signed)
STROKE TEAM PROGRESS NOTE   INTERVAL HISTORY Patient is doing well . No complaints. Serum sodium is 121 this am. EEG showed left temporal sharps.MRI brain negative for stroke  Vitals:   05/23/19 0115 05/23/19 0400 05/23/19 0800 05/23/19 0844  BP: 107/60   (!) 95/58  Pulse: 66  87 79  Resp: 13  11 16   Temp:  97.8 F (36.6 C) 98.1 F (36.7 C)   TempSrc:  Oral Oral   SpO2: 100%  100% 99%  Weight:        CBC:  Recent Labs  Lab 05/21/19 1440  05/21/19 1933 05/22/19 0455  WBC 8.8  --   --  8.4  NEUTROABS 7.2  --   --   --   HGB 13.2   < > 11.6* 11.4*  HCT 36.4   < > 30.9* 30.4*  MCV 88.1  --   --  86.9  PLT 375  --   --  367   < > = values in this interval not displayed.    Basic Metabolic Panel:  Recent Labs  Lab 05/21/19 1558  05/22/19 1032 05/22/19 1410  NA  --    < > 120* 121*  K  --    < > 3.5 3.3*  CL  --    < > 91* 90*  CO2  --    < > 20* 19*  GLUCOSE  --    < > 93 113*  BUN  --    < > <5* <5*  CREATININE  --    < > 0.62 0.58  CALCIUM  --    < > 7.7* 8.0*  MG 2.2  --   --   --    < > = values in this interval not displayed.   Lipid Panel:     Component Value Date/Time   CHOL 126 05/22/2019 0455   TRIG 37 05/22/2019 0455   HDL 74 05/22/2019 0455   CHOLHDL 1.7 05/22/2019 0455   VLDL 7 05/22/2019 0455   LDLCALC 45 05/22/2019 0455   HgbA1c:  Lab Results  Component Value Date   HGBA1C 5.2 05/22/2019   Urine Drug Screen: No results found for: LABOPIA, COCAINSCRNUR, LABBENZ, AMPHETMU, THCU, LABBARB  Alcohol Level No results found for: [place] Head Code Stroke Wo Contrast 05/21/2019 1457 1. Mild chronic small-vessel change of the hemispheric white matter. No acute infarction seen. No hemorrhage. One could question a small hyperdense focus in the left M1 segment. 2. ASPECTS is 10.   Ct Angio Head W Or Wo Contrast Ct Angio Neck W Or Wo Contrast 05/21/2019 1511 No large or medium vessel occlusion. Markedly tortuous vessels suggesting a history of  hypertension. Aortic atherosclerosis and ectasia. No carotid bifurcation stenosis or vertebral origin stenosis. Upper cervical internal carotid arteries are markedly tortuous, more so on the right than the left. 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 No significant intracranial finding.   Ct Cervical Spine Wo Contrast 05/21/2019 1602 Degenerative facet disease changes of the cervical spine which likely account for 2.2 mm of anterolisthesis at C4-C5. No acute cervical spine abnormalities.   MRI head 05/22/2019 No acute finding by MRI. Age related atrophy. Mild to moderate chronic small-vessel ischemic changes of the cerebral hemispheric white matter.  Dg Hip Port Unilat With Pelvis 1v Right 05/22/2019 1239 Soft tissue edema over the right hip.  No fractures.   Dg Femur Port, 1v Right  05/22/2019 1239 Soft tissue edema.  No identified fracture.    2D Echocardiogram  05/21/2019  1. The left ventricle has normal systolic function with an ejection fraction of 60-65%. The cavity size was normal. Moderate Basal septal hypertrophy. Left ventricular diastolic Doppler parameters are consistent with impaired relaxation. No evidence of left ventricular regional wall motion abnormalities.  2. The right ventricle has normal systolic function. The cavity was normal. There is no increase in right ventricular wall thickness.  3. No subcostal views available to assess pericardium but there appears to be a small pericardial effusion over the RV free wall in the parasternal short axis view.  4. The aortic valve is tricuspid. Moderate sclerosis of the aortic valve. Aortic valve regurgitation is mild to moderate by color flow Doppler.  5. There is mild dilatation of the ascending aorta measuring 40 mm.  EEG This EEG recorded evidence of left anterior temporal irritability which could serve as a potential seizure focus. There was no seizure recorded on this study.  Please note that lack of epileptiform activity on EEG does not preclude the possibility of epilepsy.  EEG 1) left anterior temporal sharp waves Clinical Interpretation: This EEG recorded evidence of left anterior temporal irritability which could serve as a potential seizure focus. There was no seizure recorded on this study. Please note that lack of epileptiform activity on EEG does not preclude the possibility of epilepsy.    PHYSICAL EXAM  frail elderly Caucasian lady not in distress.  She has large right hip subcutaneous hematoma.  She has abrasions on her elbows.  She is slightly hard of hearing. . Afebrile. Head is nontraumatic. Neck is supple without bruit.    Cardiac exam no murmur or gallop. Lungs are clear to auscultation. Distal pulses are well felt. Neurological Exam ;  Awake  Alert oriented x 3. Normal speech no aphasia and can name repeat and comprehend quite well.  Slightly diminished attention registration and recall.  She follows two-step commands..eye movements full without nystagmus.fundi were not visualized. Vision acuity and fields appear normal. Hearing is normal. Palatal movements are normal. Face symmetric. Tongue midline. Normal strength, tone, reflexes and coordination. Normal sensation. Gait deferred.  ASSESSMENT/PLAN Anna Castaneda is a 73 y.o. female with history of arthritis, gastroesophageal reflux disease, myocardial infarction, osteoporosis and broken heart syndrome found down at the mailbox presenting with severe expressive and receptive language difficulties as well as right facial droop.  TPA administered 05/21/2019 at 1455.  After TPA administration, sodium found to be 108.  4-1/2 hours post TPA administration patient developed hematoma and right thigh received transaminase.  Systolic blood pressure in the 90s.  Hemoglobin 11.8.  Cryo and blood transfusion considered but not administered.  Stroke-like episode status post TPA  Symptomatic hyponatremia versus  Seizure   Code Stroke CT head No acute abnormality. Small vessel disease. Atrophy. Possible hyperdense L M1.  ASPECTS 10.     CT CS degenerative disease no acute abnormalities  CTA head & neck no LVO.  Tortuous vessels.  Aortic atherosclerosis.  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  No significant finding  MRI  No acute abnormality. Small vessel disease. Atrophy.   2D Echo EF 60-65%. No source of embolus   EEG w/ sharpe waves, L temporal irritability  LDL 45  HgbA1c 5.2  TSH normal  UA pending    SCDs for VTE prophylaxis  aspirin 81 mg daily prior to admission, now on No antithrombotic given thigh hematoma   Add Keppra  500 XL daily  Patient has been advised no driving x 6 months given seizure  Therapy recommendations: SNF   Disposition: Pending.  [facility] SW consult to assist w/ dispo  Medically stable for d/c today  Post TPA hemorrhage  Right thigh hematoma  Found down  Received TXA 05/21/2019 around 8 PM  Systolic blood pressure in the 70s at onset  Fibrinogen level 206  Consider cryo but did not administer  Hemoglobin 13.2->11.6 (time of hmg)->11.4->12.3  Hold home aspirin  X-ray right hip and thigh - no fx. Soft tissue edema  Hyponatremia  Likely chronic  Sodium on admission 108  Now up to 121  Confused on admission, clearing.  This cannot be explained by hyponatremia.  Hypertension   Home meds: Zestril 5  BP remains low this am  Continue to hold home blood pressure medicines at this time . SBP goal < 160 given hmg in leg . Long-term BP goal normotensive  Hyperlipidemia  Home meds:  lipitor 20  Resumed lipitior in hospital   LDL 45, goal < 70  Continue statin at discharge  Other Stroke Risk Factors  Advanced age  Former Cigarette smoker  Coronary artery disease hx MI  Other Active Problems  Hypokalemia 3.4->3.3  Hypocalcemia 7.7->8.0  GERD on protonix   IM consultant  following for medical issues  Hospital day # 2  Transfer to neuro floor. Mobilize out of bed. Therapy consults. Trial of keppra for seizures. Do not drive for 6 months.I have spent a total of  25  minutes with the patient reviewing hospital notes,  test results, labs and examining the patient as well as establishing an assessment and plan that was discussed personally with the patient.  > 50% of time was spent in direct patient care.       Delia HeadyPramod Alyla Pietila, MD Medical Director [facility] Pager: [phone] 05/23/2019 9:29 AM   To contact Stroke Continuity provider, please refer to [email or web address]. After hours, contact General Neurology

## 2019-05-23 NOTE — TOC Initial Note (Signed)
Transition of Care [facility]) - Initial/Assessment Note    Patient Details  Name: Anna Castaneda MRN: [id number] Date of Birth: [birth year]  [facility]) CM/SW Contact:    Pollie Friar, RN Phone Number: 05/23/2019, 12:10 PM  Clinical Narrative:                 Pt is from [place]. The plan is for her to return to [facility] rehab prior to returning to her apartment. CM met with the patient and she is in agreement with the plans. CM reached out to Seth Bake at [facility] and they will have a place for her at d/c. Seth Bake asked that information be sent to them on the day of d/c. FL2 completed. TOC following.  Expected Discharge Plan: Skilled Nursing Facility Barriers to Discharge: Continued Medical Work up   Patient Goals and CMS Choice   CMS Medicare.gov Compare Post Acute Care list provided to:: Patient Choice offered to / list presented to : Patient  Expected Discharge Plan and Services Expected Discharge Plan: [place] In-house Referral: Clinical Social Work Discharge Planning Services: CM Consult Post Acute Care Choice: [place] Living arrangements for the past 2 months: Apartment                                      Prior Living Arrangements/Services Living arrangements for the past 2 months: Apartment Lives with:: Self Patient language and need for interpreter reviewed:: Yes(no needs) Do you feel safe going back to the place where you live?: Yes      Need for Family Participation in Patient Care: Yes (Comment)(supervision recommended) Care giver support system in place?: No (comment)   Criminal Activity/Legal Involvement Pertinent to Current Situation/Hospitalization: No - Comment as needed  Activities of Daily Living Home Assistive Devices/Equipment: None ADL Screening (condition at time of admission) Patient's cognitive ability adequate to safely complete daily activities?: Yes Is the patient deaf or have  difficulty hearing?: No Does the patient have difficulty seeing, even when wearing glasses/contacts?: No Does the patient have difficulty concentrating, remembering, or making decisions?: No Patient able to express need for assistance with ADLs?: No Does the patient have difficulty dressing or bathing?: No Independently performs ADLs?: Yes (appropriate for developmental age) Does the patient have difficulty walking or climbing stairs?: No Weakness of Legs: None Weakness of Arms/Hands: None  Permission Sought/Granted                  Emotional Assessment Appearance:: Appears stated age Attitude/Demeanor/Rapport: Engaged Affect (typically observed): Accepting, Pleasant Orientation: : Oriented to Self, Oriented to Place, Oriented to  Time, Oriented to Situation   Psych Involvement: No (comment)  Admission diagnosis:  Hypocalcemia [E83.51] Hypochloremia [E87.8] Hyponatremia [E87.1] Cerebrovascular accident (CVA), unspecified mechanism ([place]) [I63.9] Patient Active Problem List   Diagnosis Date Noted  . Hemorrhagic shock ([place]) 05/22/2019  . Essential hypertension 05/22/2019  . Dyslipidemia 05/22/2019  . Hypokalemia 05/22/2019  . Stroke ([place]) 05/21/2019  . Hyponatremia    PCP:  Tracie Harrier, MD Pharmacy:   [facility] [phone] [address], [place] [address] Phone: [phone] Fax: [phone]     Social Determinants of Health (SDOH) Interventions    Readmission Risk Interventions No flowsheet data found.

## 2019-05-23 NOTE — Progress Notes (Signed)
Physical Therapy Treatment Patient Details Name: Anna Castaneda MRN: [id number] DOB: [birth year] Today's Date: 05/23/2019    History of Present Illness Pt is a 73 y.o. F with significant PMH of myocardial infarction, osteoporosis, who was brought in as code stroke. She lives in independent living facility where she was found down at her mailbox. 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 CT unremarkable. Administered TPA. Shortly after arrival to ICU, became hypotensive and developed large right hematoma; tPA reversed.     PT Comments    Pt progressing towards physical therapy goals. Improved balance, requiring min guard assist for ambulating 450 feet with no assistive device. Able to perform high level balance activities I.e. side stepping, backwards walking with hands on assist. Continues with decreased gait speed, dynamic balance impairments, and decreased awareness. Do not recommend she go home alone, so will need SNF at [facility] to transition back into independent living.     Follow Up Recommendations  Supervision for mobility/OOB;SNF [facility] resident)     Equipment Recommendations  None recommended by PT    Recommendations for Other Services       Precautions / Restrictions Precautions Precautions: Fall Restrictions Weight Bearing Restrictions: No    Mobility  Bed Mobility               General bed mobility comments: OOB in chair  Transfers Overall transfer level: Needs assistance Equipment used: None Transfers: Sit to/from Stand Sit to Stand: Min guard            Ambulation/Gait Ambulation/Gait assistance: Min guard Gait Distance (Feet): 450 Feet Assistive device: None Gait Pattern/deviations: Step-through pattern;Decreased stride length;Drifts right/left Gait velocity: decreased Gait velocity interpretation: <1.8 ft/sec, indicate of risk for recurrent falls General Gait Details: Pt requiring  min guard for balance, no overt LOB today. Cues for increased bilateral foot clearance, reciprocal arm swing. Still drifting right/left.    Stairs             Wheelchair Mobility    Modified Rankin (Stroke Patients Only) Modified Rankin (Stroke Patients Only) Pre-Morbid Rankin Score: No symptoms Modified Rankin: Moderately severe disability     Balance Overall balance assessment: Needs assistance Sitting-balance support: Feet supported Sitting balance-Leahy Scale: Good     Standing balance support: No upper extremity supported;During functional activity Standing balance-Leahy Scale: Good                              Cognition Arousal/Alertness: Awake/alert Behavior During Therapy: WFL for tasks assessed/performed Overall Cognitive Status: Impaired/Different from baseline Area of Impairment: Awareness;Safety/judgement                         Safety/Judgement: Decreased awareness of deficits Awareness: Emergent   General Comments: Some emerging awareness about balance deficits, recognizing she can't go home alone right now      Exercises Other Exercises Other Exercises: Dynamic balance: head turns, turns to right/left, side stepping, backwards walking    General Comments        Pertinent Vitals/Pain Pain Assessment: No/denies pain    Home Living                      Prior Function            PT Goals (current goals can now be found in the care plan section) Acute Rehab PT  Goals Patient Stated Goal: "go back to [facility]." PT Goal Formulation: With patient Time For Goal Achievement: 06/05/19 Potential to Achieve Goals: Good Progress towards PT goals: Progressing toward goals    Frequency    Min 3X/week      PT Plan Current plan remains appropriate    Co-evaluation              AM-PAC PT "6 Clicks" Mobility   Outcome Measure  Help needed turning from your back to your side while in a flat bed without  using bedrails?: None Help needed moving from lying on your back to sitting on the side of a flat bed without using bedrails?: None Help needed moving to and from a bed to a chair (including a wheelchair)?: A Little Help needed standing up from a chair using your arms (e.g., wheelchair or bedside chair)?: A Little Help needed to walk in hospital room?: A Little Help needed climbing 3-5 steps with a railing? : A Lot 6 Click Score: 19    End of Session Equipment Utilized During Treatment: Gait belt Activity Tolerance: Patient tolerated treatment well Patient left: in chair;with call bell/phone within reach;with nursing/sitter in room Nurse Communication: Mobility status PT Visit Diagnosis: Unsteadiness on feet (R26.81);Difficulty in walking, not elsewhere classified (R26.2)     Time: 2595-6387 PT Time Calculation (min) (ACUTE ONLY): 13 min  Charges:  $Therapeutic Activity: 8-22 mins                     Anna Castaneda, PT, DPT Acute Rehabilitation Services Pager [phone] Office [phone]    Willy Eddy 05/23/2019, 11:12 AM

## 2019-05-24 ENCOUNTER — Encounter (HOSPITAL_COMMUNITY): Payer: Self-pay | Admitting: *Deleted

## 2019-05-24 DIAGNOSIS — S7011XS Contusion of right thigh, sequela: Secondary | ICD-10-CM

## 2019-05-24 DIAGNOSIS — W19XXXA Unspecified fall, initial encounter: Secondary | ICD-10-CM | POA: Diagnosis present

## 2019-05-24 DIAGNOSIS — K219 Gastro-esophageal reflux disease without esophagitis: Secondary | ICD-10-CM | POA: Diagnosis present

## 2019-05-24 DIAGNOSIS — R569 Unspecified convulsions: Secondary | ICD-10-CM

## 2019-05-24 LAB — BASIC METABOLIC PANEL
Anion gap: 7 (ref 5–15)
Anion gap: 9 (ref 5–15)
BUN: 7 mg/dL — ABNORMAL LOW (ref 8–23)
BUN: 6 mg/dL — ABNORMAL LOW (ref 8–23)
CO2: 19 mmol/L — ABNORMAL LOW (ref 22–32)
CO2: 19 mmol/L — ABNORMAL LOW (ref 22–32)
Calcium: 7.9 mg/dL — ABNORMAL LOW (ref 8.9–10.3)
Calcium: 7.7 mg/dL — ABNORMAL LOW (ref 8.9–10.3)
Chloride: 104 mmol/L (ref 98–111)
Chloride: 102 mmol/L (ref 98–111)
Creatinine, Ser: 0.63 mg/dL (ref 0.44–1.00)
Creatinine, Ser: 0.54 mg/dL (ref 0.44–1.00)
GFR calc Af Amer: >60 mL/min (ref >60)
GFR calc Af Amer: >60 mL/min (ref >60)
GFR calc non Af Amer: >60 mL/min (ref >60)
GFR calc non Af Amer: >60 mL/min (ref >60)
Glucose, Bld: 100 mg/dL — ABNORMAL HIGH (ref 70–99)
Glucose, Bld: 99 mg/dL (ref 70–99)
Potassium: 4.1 mmol/L (ref 3.5–5.1)
Potassium: 4.2 mmol/L (ref 3.5–5.1)
Sodium: 130 mmol/L — ABNORMAL LOW (ref 135–145)
Sodium: 130 mmol/L — ABNORMAL LOW (ref 135–145)

## 2019-05-24 LAB — CBC
HCT: 29.8 % — ABNORMAL LOW (ref 36.0–46.0)
Hemoglobin: 10.6 g/dL — ABNORMAL LOW (ref 12.0–15.0)
MCH: 32.3 pg (ref 26.0–34.0)
MCHC: 35.6 g/dL (ref 30.0–36.0)
MCV: 90.9 fL (ref 80.0–100.0)
Platelets: 357 10*3/uL (ref 150–400)
RBC: 3.28 MIL/uL — ABNORMAL LOW (ref 3.87–5.11)
RDW: 12.8 % (ref 11.5–15.5)
WBC: 8.2 10*3/uL (ref 4.0–10.5)
nRBC: 0 % (ref 0.0–0.2)

## 2019-05-24 LAB — MAGNESIUM: Magnesium: 2 mg/dL (ref 1.7–2.4)

## 2019-05-24 MED ORDER — LEVETIRACETAM ER 500 MG PO TB24: 500.0000 mg | ORAL_TABLET | Freq: Every day | ORAL | 2 refills | Status: DC

## 2019-05-24 MED ORDER — SENNOSIDES-DOCUSATE SODIUM 8.6-50 MG PO TABS: 1.0000 | ORAL_TABLET | Freq: Every evening | ORAL | 1 refills | Status: AC | PRN

## 2019-05-24 NOTE — Discharge Summary (Addendum)
Stroke Discharge Summary  Patient ID: Anna Castaneda    l   MRN: [id number]      DOB: 15-Dec-1945  Date of Admission: 05/21/2019 Date of Discharge: 05/24/2019  Attending Physician:  Micki RileySethi, Pramod S, MD, Stroke MD Consultant(s):   Joneen RoachPaul Hoffman NP for Mateo FlowWael Aljishi, MD ( pulmonary/intensive care ), Jonah BlueJennifer Yates, MD (FP) Patient's PCP:  Barbette ReichmannHande, Vishwanath, MD  DISCHARGE DIAGNOSIS:  Principal Problem: Stroke like episode due to   probable unwitnessed Seizure [facility](HCC) and post ictal state with severe  Hyponatremia Active Problems:   Stroke-like episode [facility](HCC) s/p tPA   Hyponatremia   Hemorrhagic shock (HCC)   Essential hypertension   Dyslipidemia   Hypokalemia   Fall   Thigh hematoma, right, sequela   GERD (gastroesophageal reflux disease)   Hypocalcemia   Past Medical History:  Diagnosis Date  . Arthritis 2018  . GERD (gastroesophageal reflux disease) 2005  . Myocardial infarction (HCC) 02/01/2016   Broken Heart Syndrome  . Osteoporosis 2018   Past Surgical History:  Procedure Laterality Date  . CATARACT EXTRACTION Bilateral 2004  . TONSILLECTOMY Bilateral 1954  . TUBAL LIGATION  1980    Allergies as of 05/24/2019      Reactions   Lactose Intolerance (gi) Diarrhea      Medication List    STOP taking these medications   Aspirin 81 81 MG EC tablet Generic drug: aspirin     TAKE these medications   ALPHA-D-GALACTOSIDASE PO Take 300 Units by mouth.   aluminum hydroxide-magnesium carbonate 95-358 mg/15 mL Susp Commonly known as: GAVISCON Take by mouth every evening. nightly   atorvastatin 20 MG tablet Commonly known as: LIPITOR Take 20 mg by mouth daily.   bismuth subsalicylate 262 MG/15ML suspension Commonly known as: PEPTO BISMOL Take by mouth.   CALCIUM LACTATE PO Take by mouth.   cetirizine 10 MG tablet Commonly known as: ZYRTEC Take 10 mg by mouth daily as needed for allergies.   co-enzyme Q-10 50 MG capsule Take 200 mg by mouth daily. Ubidecarenone  (Co-Enzyme Q-10, Ubiquinone) 200 tablet   denosumab 60 MG/ML Sosy injection Commonly known as: PROLIA Inject 60 mg into the skin every 6 (six) months.   FERROUS FUM-IRON POLYSACCH PO Take by mouth. Iron fum, poly #1-vitC-L.casei 130 mg iron-25 mg-30 mg Cap   levETIRAcetam 500 MG 24 hr tablet Commonly known as: KEPPRA XR Take 1 tablet (500 mg total) by mouth daily. Start taking on: May 25, 2019   lisinopril 5 MG tablet Commonly known as: ZESTRIL Take 5 mg by mouth daily.   metronidazole 1 % cream Commonly known as: NORITATE Apply topically daily. Apply to face once daily   Noritate 1 % cream Generic drug: metronidazole Apply 1 application topically See admin instructions. APP TO FACE ONCE DAILY   MULTIVITAMIN PO Take by mouth. Multivitamin with B complex-vitamin C (FARBEE WITH C) tablet   NON FORMULARY Take by mouth gelatin sponge, absorb/porcine (Gelatine Absorbable MISC)   NON FORMULARY Take by mouth C, E, zinc, copper 11/omega3s/lut (OCUVITE ADULT50+ ORAL)   NON FORMULARY Benefiber, Guar gum, Oral   NON FORMULARY Calm with Calcium   NON FORMULARY CocoaVia   pantoprazole 20 MG tablet Commonly known as: PROTONIX Take 20 mg by mouth daily.   senna-docusate 8.6-50 MG tablet Commonly known as: Senokot-S Take 1 tablet by mouth at bedtime as needed for mild constipation.   SIMETHICONE PO Take 1 tablet by mouth every 6 (six) hours as needed (flutulence). GAS-X ORAL)   Vitamin  D3 50 MCG (2000 UT) capsule Take 2,000 Units by mouth daily.       LABORATORY STUDIES CBC    Component Value Date/Time   WBC 8.2 05/24/2019 0348   RBC 3.28 (L) 05/24/2019 0348   HGB 10.6 (L) 05/24/2019 0348   HCT 29.8 (L) 05/24/2019 0348   PLT 357 05/24/2019 0348   MCV 90.9 05/24/2019 0348   MCH 32.3 05/24/2019 0348   MCHC 35.6 05/24/2019 0348   RDW 12.8 05/24/2019 0348   LYMPHSABS 0.9 05/21/2019 1440   MONOABS 0.7 05/21/2019 1440   EOSABS 0.0 05/21/2019 1440   BASOSABS 0.0  05/21/2019 1440   CMP    Component Value Date/Time   NA 130 (L) 05/24/2019 0741   K 4.2 05/24/2019 0741   CL 102 05/24/2019 0741   CO2 19 (L) 05/24/2019 0741   GLUCOSE 99 05/24/2019 0741   BUN 6 (L) 05/24/2019 0741   CREATININE 0.54 05/24/2019 0741   CALCIUM 7.7 (L) 05/24/2019 0741   PROT 5.5 (L) 05/21/2019 1933   ALBUMIN 3.5 05/21/2019 1933   AST 29 05/21/2019 1933   ALT 21 05/21/2019 1933   ALKPHOS 38 05/21/2019 1933   BILITOT 1.2 05/21/2019 1933   GFRNONAA >60 05/24/2019 0741   GFRAA >60 05/24/2019 0741   COAGS Lab Results  Component Value Date   INR 1.1 05/21/2019   Lipid Panel    Component Value Date/Time   CHOL 126 05/22/2019 0455   TRIG 37 05/22/2019 0455   HDL 74 05/22/2019 0455   CHOLHDL 1.7 05/22/2019 0455   VLDL 7 05/22/2019 0455   LDLCALC 45 05/22/2019 0455   HgbA1C  Lab Results  Component Value Date   HGBA1C 5.2 05/22/2019     SIGNIFICANT DIAGNOSTIC STUDIES Ct Head Code Stroke Wo Contrast 05/21/2019 1457 1. Mild chronic small-vessel change of the hemispheric white matter. No acute infarction seen. No hemorrhage. One could question a small hyperdense focus in the left M1 segment. 2. ASPECTS is 10.   Ct Angio Head W Or Wo Contrast Ct Angio Neck W Or Wo Contrast 05/21/2019 1511 No large or medium vessel occlusion. Markedly tortuous vessels suggesting a history of hypertension. Aortic atherosclerosis and ectasia. No carotid bifurcation stenosis or vertebral origin stenosis. Upper cervical internal carotid arteries are markedly tortuous, more so on the right than the left. 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 No significant intracranial finding.   Ct Cervical Spine Wo Contrast 05/21/2019 1602 Degenerative facet disease changes of the cervical spine which likely account for 2.2 mm of anterolisthesis at C4-C5. No acute cervical spine abnormalities.   MRI head 05/22/2019 No acute finding by MRI. Age  related atrophy. Mild to moderate chronic small-vessel ischemic changes of the cerebral hemispheric white matter.  Dg Hip Port Unilat With Pelvis 1v Right 05/22/2019 1239 Soft tissue edema over the right hip.  No fractures.   Dg Femur Port, 1v Right 05/22/2019 1239 Soft tissue edema.  No identified fracture.    2D Echocardiogram  05/21/2019 1. The left ventricle has normal systolic function with an ejection fraction of 60-65%. The cavity size was normal. Moderate Basal septal hypertrophy. Left ventricular diastolic Doppler parameters are consistent with impaired relaxation. No evidence of left ventricular regional wall motion abnormalities. 2. The right ventricle has normal systolic function. The cavity was normal. There is no increase in right ventricular wall thickness. 3. No subcostal views available to assess pericardium but there appears to be a  small pericardial effusion over the RV free wall in the parasternal short axis view. 4. The aortic valve is tricuspid. Moderate sclerosis of the aortic valve. Aortic valve regurgitation is mild to moderate by color flow Doppler. 5. There is mild dilatation of the ascending aorta measuring 40 mm.  EEG ThisEEG recorded evidence of left anterior temporal irritability which could serve as a potential seizure focus. There was no seizure recorded on this study. Please note that lack of epileptiform activity on EEG does not preclude the possibility of epilepsy.  EEG 1)left anterior temporal sharp waves Clinical Interpretation: ThisEEG recorded evidence of left anterior temporal irritability which could serve as a potential seizure focus. There was no seizure recorded on this study. Please note that lack of epileptiform activity on EEG does not preclude the possibility of epilepsy.     HISTORY OF PRESENT ILLNESS Anna Castaneda is an 73 y.o. female with past medical history of arthritis, gastroesophageal reflux disease, myocardial infarction,  osteoporosis and broken heart syndrome who was brought in as a code stroke by [facility].  She lives in the independent living facility where she was found down at her mailbox and 1:50 PM today.  10 minutes prior she had been seen to be normal by a witness (LKW 1340 05/21/2019).  EMS noticed that she had severe expressive and receptive language difficulties as well as right facial droop.  She had NIH stroke scale of 5 on admission but appears to be improving slightly by the time noncontrast CT scan of the head was performed which showed no acute abnormality.  Patient's medication list was reviewed and did not have any anticoagulants.  Blood pressure was mildly elevated and blood glucose was 170.  No family was available and patient was not able to consent for TPA but considering risk-benefit it was administered in the patient's interest. Baseline mRS 0. She was admitted to the neuro ICU for post tPA care.  HOSPITAL COURSE Ms. Amerie Mikrut is a 73 y.o. female with history of arthritis, gastroesophageal reflux disease, myocardial infarction, osteoporosis and broken heart syndrome found down at the mailbox presenting with severe expressive and receptive language difficulties as well as right facial droop. TPA administered 05/21/2019 at 1455.  After TPA administration, sodium found to be 108.  4-1/2 hours post TPA administration patient developed hematoma and right thigh received transaminase.  Systolic blood pressure in the 90s.  Hemoglobin 11.8.  Cryo and blood transfusion considered but not administered.  Unprovoked Seizure in setting of Symptomatic hyponatremia  Stroke-like episode status post TPA w/ post tPA thigh hematoma given fall PTA  Code Stroke CT head No acute abnormality. Small vessel disease. Atrophy. Possible hyperdense L M1.  ASPECTS 10.     CT CS degenerative disease no acute abnormalities  CTA head & neck no LVO.  Tortuous vessels.  Aortic atherosclerosis.  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  No significant finding  MRI  No acute abnormality. Small vessel disease. Atrophy.   2D Echo EF 60-65%. No source of embolus   EEG w/ sharpe waves, L temporal irritability  LDL 45  HgbA1c 5.2  TSH normal  UA ordered, not collected  aspirin 81 mg daily prior to admission, now on No antithrombotic given thigh hematoma   added Keppra 500 XL daily  Patient has been advised no driving x 6 months given seizure  Therapy recommendations: SNF  Disposition: SNF at [facility]. Admitted from independent living facility [facility].   Post TPA Right thigh  hematoma s/p fall PTA Acute blood loss anemia  Received TXA 05/21/2019 around 8 PM  Systolic blood pressure in the 70s at onset  Fibrinogen level 206  Consider cryo but did not administer  transfusion ordered but not administered  Hemoglobin 13.2->11.6 (time of hmg)->11.4->12.3->10.6  Hold home aspirin  X-ray right hip and thigh - no fx. Soft tissue edema  Hyponatremia  Likely chronic  Sodium on admission 108  Now up to 130  Confused on admission clearing.  This cannot be explained by hyponatremia.  Essenatial Hypertension   Home meds: Zestril 5  BP normalizing  Continue to hold home blood pressure medicines at this time  BP goal normotensive  Hyperlipidemia  Home meds:  lipitor 20  Resumed lipitior in hospital   LDL 45, goal < 70  Continue statin at discharge  Other Stroke Risk Factors  Advanced age  Former Cigarette smoker  Coronary artery disease hx MI  Other Active Problems  Hypokalemia 3.4->3.3->4.2  Hypocalcemia 7.7->8.0->7.7  GERD on protonix   IM consulted for medical issues   DISCHARGE EXAM Blood pressure 104/60, pulse 82, temperature 98 F (36.7 C), temperature source Oral, resp. rate 15, height 5' (1.524 m), weight 55.8 kg, SpO2 100 %. frail elderly Caucasian lady not in distress.  She has large right hip subcutaneous hematoma.  She has abrasions on her  elbows.  She is slightly hard of hearing. . Afebrile. Head is nontraumatic. Neck is supple without bruit.    Cardiac exam no murmur or gallop. Lungs are clear to auscultation. Distal pulses are well felt. Neurological Exam ;  Awake  Alert oriented x 3. Normal speech no aphasia and can name repeat and comprehend quite well.  Slightly diminished attention registration and recall.  She follows two-step commands..eye movements full without nystagmus.fundi were not visualized. Vision acuity and fields appear normal. Hearing is normal. Palatal movements are normal. Face symmetric. Tongue midline. Normal strength, tone, reflexes and coordination. Normal sensation. Gait deferred.  Discharge Diet  Regular thin liquids  DISCHARGE PLAN  Disposition:  [facility]  aspirin 81 mg daily on hold given thigh hematoma.   Repeat labs in 3-5 days  Resume BP meds as hypotension resolves  Follow-up Hande, Vishwanath, MD in 2 weeks.  Follow-up in [facility] in 4 weeks, office to schedule an appointment.   35 minutes were spent preparing discharge.  Annie MainSharon Biby, MSN, APRN, ANVP-BC, AGPCNP-BC Advanced Practice Stroke Nurse [facility] See Amion for Schedule & Pager information 05/24/2019 10:30 AM   I have personally obtained history,examined this patient, reviewed notes, independently viewed imaging studies, participated in medical decision making and plan of care.ROS completed by me personally and pertinent positives fully documented  I have made any additions or clarifications directly to the above note. Agree with note above.   Delia HeadyPramod Sethi, MD Medical Director [facility] Pager: [phone] 05/24/2019 3:02 PM

## 2019-05-24 NOTE — TOC Transition Note (Signed)
Transition of Care [facility]) - CM/SW Discharge Note   Patient Details  Name: Anna Castaneda MRN: [id number] Date of Birth: 06/19/1946  Transition of Care [facility]) CM/SW Contact:  Pollie Friar, RN Phone Number: 05/24/2019, 12:40 PM   Clinical Narrative:    Pt discharging to [facility] rehab prior [place]. Information sent to [facility]. Pt to transport via PTAR. D/C packet at the desk and bedside RN updated.   Number for report: [phone] Room number: 791   Final next level of care: Skilled Nursing Facility Barriers to Discharge: No Barriers Identified   Patient Goals and CMS Choice   CMS Medicare.gov Compare Post Acute Care list provided to:: Patient Choice offered to / list presented to : Patient  Discharge Placement PASRR number recieved: 05/23/19            Patient chooses bed at: [facility] Patient to be transferred to facility by: [facility] Name of family member notified: patient Patient and family notified of of transfer: 05/24/19  Discharge Plan and Services In-house Referral: Clinical Social Work Discharge Planning Services: CM Consult Post Acute Care Choice: [place]                               Social Determinants of Health (SDOH) Interventions     Readmission Risk Interventions No flowsheet data found.

## 2019-05-24 NOTE — Care Management Important Message (Signed)
Important Message  Patient Details  Name: Anna Castaneda MRN: [id number] Date of Birth: Jan 21, 1946   Medicare Important Message Given:  Yes     Khadijah Mastrianni Montine Circle 05/24/2019, 3:41 PM

## 2019-05-24 NOTE — Progress Notes (Signed)
IV removed without complication, pt education provided to patient, and report called to RN at [facility]. Awaiting [facility] for transport.

## 2019-05-24 NOTE — Progress Notes (Signed)
Occupational Therapy Treatment Patient Details Name: Anna Castaneda MRN: [id number] DOB: Nov 17, 1945 Today's Date: 05/24/2019    History of present illness Pt is a 73 y.o. F with significant PMH of myocardial infarction, osteoporosis, who was brought in as code stroke. She lives in independent living facility where she was found down at her mailbox. 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 CT unremarkable. Administered TPA. Shortly after arrival to ICU, became hypotensive and developed large right hematoma; tPA reversed.    OT comments  Pt is functioning at a supervision level in ADL. Pt to d/c to SNF later today.  Follow Up Recommendations  SNF    Equipment Recommendations  None recommended by OT    Recommendations for Other Services      Precautions / Restrictions         Mobility Bed Mobility               General bed mobility comments: OOB in chair  Transfers Overall transfer level: Needs assistance Equipment used: None Transfers: Sit to/from Stand Sit to Stand: Supervision              Balance Overall balance assessment: Needs assistance   Sitting balance-Leahy Scale: Good       Standing balance-Leahy Scale: Good                             ADL either performed or assessed with clinical judgement   ADL Overall ADL's : Needs assistance/impaired     Grooming: Wash/dry hands;Brushing hair;Standing;Supervision/safety           Upper Body Dressing : Set up;Sitting   Lower Body Dressing: Supervision/safety;Sit to/from stand   Toilet Transfer: Supervision/safety;Ambulation   Toileting- Clothing Manipulation and Hygiene: Supervision/safety;Sit to/from stand       Functional mobility during ADLs: Supervision/safety       Vision       Perception     Praxis      Cognition Arousal/Alertness: Awake/alert Behavior During Therapy: WFL for tasks assessed/performed Overall  Cognitive Status: Within Functional Limits for tasks assessed                                 General Comments: cognitive issues have resolved        Exercises     Shoulder Instructions       General Comments      Pertinent Vitals/ Pain       Pain Assessment: No/denies pain  Home Living                                          Prior Functioning/Environment              Frequency  Min 2X/week        Progress Toward Goals  OT Goals(current goals can now be found in the care plan section)  Progress towards OT goals: Progressing toward goals  Acute Rehab OT Goals Patient Stated Goal: "go back to [facility]." OT Goal Formulation: With patient Time For Goal Achievement: 06/05/19 Potential to Achieve Goals: Good  Plan Discharge plan remains appropriate    Co-evaluation                 AM-PAC  OT "6 Clicks" Daily Activity     Outcome Measure   Help from another person eating meals?: None Help from another person taking care of personal grooming?: None Help from another person toileting, which includes using toliet, bedpan, or urinal?: None Help from another person bathing (including washing, rinsing, drying)?: None Help from another person to put on and taking off regular upper body clothing?: None Help from another person to put on and taking off regular lower body clothing?: None 6 Click Score: 24    End of Session Equipment Utilized During Treatment: Gait belt  OT Visit Diagnosis: Unsteadiness on feet (R26.81);Muscle weakness (generalized) (M62.81)   Activity Tolerance Patient tolerated treatment well   Patient Left in chair;with call bell/phone within reach;with chair alarm set   Nurse Communication          Time: 5102-5852 OT Time Calculation (min): 16 min  Charges: OT General Charges $OT Visit: 1 Visit OT Treatments $Self Care/Home Management : 8-22 mins  Martie Round, OTR/L Acute Rehabilitation  Services Pager: [phone] Office: [phone]   Evern Bio 05/24/2019, 1:16 PM

## 2019-05-24 NOTE — Progress Notes (Signed)
PROGRESS NOTE    Anna Castaneda  KJI:[id number] DOB: Mar 27, 1946 DOA: 05/21/2019 PCP: Barbette Reichmann, MD   Brief Narrative:  73 year old with history of GERD, CAD, type Assubel cardiomyopathy presented to the hospital with syncope with concerns for possible CVA.  She received TPA unfortunately was complicated by right-sided hip hematoma requiring reversal of TPA.  Also noted to have significant hyponatremia with sodium as low as 104.  This is improved with IV fluids   Assessment & Plan:   Principal Problem:   Hyponatremia Active Problems:   Stroke (HCC)   Hemorrhagic shock (HCC)   Essential hypertension   Dyslipidemia   Hypokalemia  Severe hyponatremia secondary to hypovolemia, improved Hypokalemia -With normal saline her sodium levels have improved from 104 to 130, slow correction. -Replete potassium  Acute lacunar CVA status post TPA complicated by right hip hematoma - Stroke team following.  Currently hemodynamically stable.  Hemoglobin remained stable as well.  There is also a question of seizure-like activity, defer further decision to neurology team.  Aspirin when appropriate per primary team -Keppra has been started  Hemorrhagic shock secondary to right hip hematoma Acute blood loss anemia, stable. - Initially required 2 units of PRBC.    Essential hypertension -Hold antihypertensives.  Hyperlipidemia -Statin.  Advise repeat lab work in 3-5 days upon discharge.  Otherwise while patient is here, we will continue to follow her.  Subjective: Sitting up at the side of the bed in the chair, does not have any complaints.  Overall feels well.  Review of Systems Otherwise negative except as per HPI, including: General = no fevers, chills, dizziness, malaise, fatigue HEENT/EYES = negative for pain, redness, loss of vision, double vision, blurred vision, loss of hearing, sore throat, hoarseness, dysphagia Cardiovascular= negative for chest pain, palpitation, murmurs,  lower extremity swelling Respiratory/lungs= negative for shortness of breath, cough, hemoptysis, wheezing, mucus production Gastrointestinal= negative for nausea, vomiting,, abdominal pain, melena, hematemesis Genitourinary= negative for Dysuria, Hematuria, Change in Urinary Frequency MSK = Negative for arthralgia, myalgias, Back Pain, Joint swelling  Neurology= Negative for headache, seizures, numbness, tingling  Psychiatry= Negative for anxiety, depression, suicidal and homocidal ideation Allergy/Immunology= Medication/Food allergy as listed  Skin= Negative for Rash, lesions, ulcers, itching   Objective: Vitals:   05/23/19 1941 05/23/19 2345 05/24/19 0406 05/24/19 0932  BP: 100/62 124/62 127/67 104/60  Pulse: 74 67 70 82  Resp: 17 16 17 15   Temp: 97.6 F (36.4 C)  98.6 F (37 C) 98 F (36.7 C)  TempSrc: Oral  Oral Oral  SpO2: 100% 96% 100% 100%  Weight:   55.8 kg   Height:   5' (1.524 m)     Intake/Output Summary (Last 24 hours) at 05/24/2019 0938 Last data filed at 05/24/2019 0400 Gross per 24 hour  Intake 1480.31 ml  Output -  Net 1480.31 ml   Filed Weights   05/21/19 1503 05/24/19 0406  Weight: 55.8 kg 55.8 kg    Examination:  Constitutional: NAD, calm, comfortable, overall frail appearing Eyes: PERRL, lids and conjunctivae normal ENMT: Mucous membranes are moist. Posterior pharynx clear of any exudate or lesions.Normal dentition.  Neck: normal, supple, no masses, no thyromegaly Respiratory: clear to auscultation bilaterally, no wheezing, no crackles. Normal respiratory effort. No accessory muscle use.  Cardiovascular: Regular rate and rhythm, no murmurs / rubs / gallops. No extremity edema. 2+ pedal pulses. No carotid bruits.  Abdomen: no tenderness, no masses palpated. No hepatosplenomegaly. Bowel sounds positive.  Musculoskeletal: no clubbing / cyanosis. No joint deformity  upper and lower extremities. Good ROM, no contractures. Normal muscle tone.  Skin: no  rashes, lesions, ulcers. No induration Neurologic: CN 2-12 grossly intact. Sensation intact, DTR normal. Strength 5/5 in all 4.  Psychiatric: Normal judgment and insight. Alert and oriented x 3. Normal mood.    Data Reviewed:   CBC: Recent Labs  Lab 05/21/19 1440 05/21/19 1445 05/21/19 1933 05/22/19 0455 05/23/19 0903 05/24/19 0348  WBC 8.8  --   --  8.4 6.2 8.2  NEUTROABS 7.2  --   --   --   --   --   HGB 13.2 14.3 11.6* 11.4* 12.3 10.6*  HCT 36.4 42.0 30.9* 30.4* 34.2* 29.8*  MCV 88.1  --   --  86.9 90.2 90.9  PLT 375  --   --  367 409* 357   Basic Metabolic Panel: Recent Labs  Lab 05/21/19 1558  05/22/19 1410 05/23/19 0903 05/23/19 1542 05/24/19 0007 05/24/19 0348 05/24/19 0741  NA  --    < > 121* 127* 127* 130*  --  130*  K  --    < > 3.3* 3.3* 3.9 4.1  --  4.2  CL  --    < > 90* 97* 98 104  --  102  CO2  --    < > 19* 22 19* 19*  --  19*  GLUCOSE  --    < > 113* 157* 125* 100*  --  99  BUN  --    < > <5* 8 7* 7*  --  6*  CREATININE  --    < > 0.58 0.72 0.63 0.63  --  0.54  CALCIUM  --    < > 8.0* 8.3* 8.2* 7.9*  --  7.7*  MG 2.2  --   --  2.5*  --   --  2.0  --    < > = values in this interval not displayed.   GFR: Estimated Creatinine Clearance: 49 mL/min (by C-G formula based on SCr of 0.54 mg/dL). Liver Function Tests: Recent Labs  Lab 05/21/19 1440 05/21/19 1933  AST 35 29  ALT 24 21  ALKPHOS 48 38  BILITOT 1.2 1.2  PROT 6.9 5.5*  ALBUMIN 4.4 3.5   No results for input(s): LIPASE, AMYLASE in the last 168 hours. No results for input(s): AMMONIA in the last 168 hours. Coagulation Profile: Recent Labs  Lab 05/21/19 1440  INR 1.1   Cardiac Enzymes: No results for input(s): CKTOTAL, CKMB, CKMBINDEX, TROPONINI in the last 168 hours. BNP (last 3 results) No results for input(s): PROBNP in the last 8760 hours. HbA1C: Recent Labs    05/22/19 0455  HGBA1C 5.2   CBG: Recent Labs  Lab 05/21/19 1443  GLUCAP 169*   Lipid Profile: Recent  Labs    05/22/19 0455  CHOL 126  HDL 74  LDLCALC 45  TRIG 37  CHOLHDL 1.7   Thyroid Function Tests: Recent Labs    05/21/19 1550  TSH 1.368   Anemia Panel: No results for input(s): VITAMINB12, FOLATE, FERRITIN, TIBC, IRON, RETICCTPCT in the last 72 hours. Sepsis Labs: No results for input(s): PROCALCITON, LATICACIDVEN in the last 168 hours.  Recent Results (from the past 240 hour(s))  SARS Coronavirus 2 (CEPHEID - Performed in [facility] hospital lab), Hosp Order     Status: None   Collection Time: 05/21/19  3:20 PM   Specimen: Nasopharyngeal Swab  Result Value Ref Range Status   SARS Coronavirus 2 NEGATIVE NEGATIVE  Final    Comment: (NOTE) If result is NEGATIVE SARS-CoV-2 target nucleic acids are NOT DETECTED. The SARS-CoV-2 RNA is generally detectable in upper and lower  respiratory specimens during the acute phase of infection. The lowest  concentration of SARS-CoV-2 viral copies this assay can detect is 250  copies / mL. A negative result does not preclude SARS-CoV-2 infection  and should not be used as the sole basis for treatment or other  patient management decisions.  A negative result may occur with  improper specimen collection / handling, submission of specimen other  than nasopharyngeal swab, presence of viral mutation(s) within the  areas targeted by this assay, and inadequate number of viral copies  (<250 copies / mL). A negative result must be combined with clinical  observations, patient history, and epidemiological information. If result is POSITIVE SARS-CoV-2 target nucleic acids are DETECTED. The SARS-CoV-2 RNA is generally detectable in upper and lower  respiratory specimens dur ing the acute phase of infection.  Positive  results are indicative of active infection with SARS-CoV-2.  Clinical  correlation with patient history and other diagnostic information is  necessary to determine patient infection status.  Positive results do  not rule out  bacterial infection or co-infection with other viruses. If result is PRESUMPTIVE POSTIVE SARS-CoV-2 nucleic acids MAY BE PRESENT.   A presumptive positive result was obtained on the submitted specimen  and confirmed on repeat testing.  While 2019 novel coronavirus  (SARS-CoV-2) nucleic acids may be present in the submitted sample  additional confirmatory testing may be necessary for epidemiological  and / or clinical management purposes  to differentiate between  SARS-CoV-2 and other Sarbecovirus currently known to infect humans.  If clinically indicated additional testing with an alternate test  methodology [id number]) is advised. The SARS-CoV-2 RNA is generally  detectable in upper and lower respiratory sp ecimens during the acute  phase of infection. The expected result is Negative. Fact Sheet for Patients:  StrictlyIdeas.no Fact Sheet for Healthcare Providers: BankingDealers.co.za This test is not yet approved or cleared by the [place] FDA and has been authorized for detection and/or diagnosis of SARS-CoV-2 by FDA under an Emergency Use Authorization (EUA).  This EUA will remain in effect (meaning this test can be used) for the duration of the COVID-19 declaration under Section 564(b)(1) of the Act, 21 U.S.C. section 360bbb-3(b)(1), unless the authorization is terminated or revoked sooner. Performed at [facility], [place] [address], [place], [place] [address]   MRSA PCR Screening     Status: None   Collection Time: 05/22/19  3:25 AM   Specimen: Nasal Mucosa; Nasopharyngeal  Result Value Ref Range Status   MRSA by PCR NEGATIVE NEGATIVE Final    Comment:        The GeneXpert MRSA Assay (FDA approved for NASAL specimens only), is one component of a comprehensive MRSA colonization surveillance program. It is not intended to diagnose MRSA infection nor to guide or monitor treatment for MRSA infections. Performed at [facility], [place] [address] [place], [place] [address]          Radiology Studies: Mr Brain Wo Contrast  Result Date: 05/22/2019 CLINICAL DATA:  Found on the ground yesterday. Speech disturbance. Right facial droop. Negative acute CT evaluation yesterday. EXAM: MRI HEAD WITHOUT CONTRAST TECHNIQUE: Multiplanar, multiecho pulse sequences of the brain and surrounding structures were obtained without intravenous contrast. COMPARISON:  CT studies 05/21/2019 FINDINGS: Brain: Diffusion imaging does not show any subacute or acute  infarction. Brainstem and cerebellum are unremarkable except for cerebellar atrophy. Cerebral hemispheres show atrophy with mild to moderate chronic small-vessel ischemic changes of the white matter. No cortical or large vessel territory infarction. No mass lesion, hemorrhage, hydrocephalus or extra-axial collection. Vascular: Major vessels at the base of the brain show flow. Skull and upper cervical spine: Negative Sinuses/Orbits: Clear/normal.  Some mastoid fluid on the left. Other: None IMPRESSION: No acute finding by MRI. Age related atrophy. Mild to moderate chronic small-vessel ischemic changes of the cerebral hemispheric white matter. Electronically Signed   By: Paulina FusiMark  Shogry M.D.   On: 05/22/2019 13:39   Dg Hip Port Unilat With Pelvis 1v Right  Result Date: 05/22/2019 CLINICAL DATA:  Recent trauma.  Hematoma. EXAM: DG HIP (WITH OR WITHOUT PELVIS) 1V PORT RIGHT COMPARISON:  None. FINDINGS: Soft tissue edema is seen over the right hip. No fractures are seen. IMPRESSION: Soft tissue edema over the right hip.  No fractures. Electronically Signed   By: Gerome Samavid  Williams III M.D   On: 05/22/2019 12:39   Dg Femur Port, 1v Right  Result Date: 05/22/2019 : Two views of the right hip No comparison History: Hematoma after tPA.  Recent trauma. TECHNIQUE: Two AP views are obtained of the right fever. FINDINGS: Soft tissue edema is seen over the right side of the pelvis and upper thigh. No  fractures noted. IMPRESSION: Soft tissue edema.  No identified fracture. Electronically Signed   By: Gerome Samavid  Williams III M.D   On: 05/22/2019 12:39        Scheduled Meds: .  stroke: mapping our early stages of recovery book   Does not apply Once  . atorvastatin  20 mg Oral Daily  . Chlorhexidine Gluconate Cloth  6 each Topical Daily  . levETIRAcetam  500 mg Oral Daily  . pantoprazole  40 mg Oral Daily   Continuous Infusions: . sodium chloride 75 mL/hr at 05/24/19 0100     LOS: 3 days   Time spent= 15 mins    Broghan Pannone Joline Maxcyhirag Kerra Guilfoil, MD [facility]  If 7PM-7AM, please contact night-coverage [email or web address] 05/24/2019, 9:38 AM

## 2019-05-25 LAB — BPAM RBC
Blood Product Expiration Date: 202007082359
Blood Product Expiration Date: 202007312359
ISSUE DATE / TIME: 202007011913
Unit Type and Rh: 6200
Unit Type and Rh: 8400

## 2019-05-25 LAB — TYPE AND SCREEN
ABO/RH(D): AB POS
Antibody Screen: NEGATIVE
Unit division: 0
Unit division: 0

## 2019-05-27 DIAGNOSIS — I252 Old myocardial infarction: Secondary | ICD-10-CM

## 2019-05-27 DIAGNOSIS — E871 Hypo-osmolality and hyponatremia: Secondary | ICD-10-CM

## 2019-05-27 DIAGNOSIS — G40909 Epilepsy, unspecified, not intractable, without status epilepticus: Secondary | ICD-10-CM

## 2019-05-27 DIAGNOSIS — K219 Gastro-esophageal reflux disease without esophagitis: Secondary | ICD-10-CM

## 2019-05-28 ENCOUNTER — Ambulatory Visit: Payer: Medicare Other | Admitting: Physical Therapy

## 2019-05-30 ENCOUNTER — Ambulatory Visit: Payer: Medicare Other | Admitting: Physical Therapy

## 2019-06-03 ENCOUNTER — Ambulatory Visit: Payer: Medicare Other | Admitting: Physical Therapy

## 2019-06-03 ENCOUNTER — Encounter: Payer: Self-pay | Admitting: Physical Therapy

## 2019-06-06 ENCOUNTER — Encounter: Payer: Self-pay | Admitting: Physical Therapy

## 2019-06-11 ENCOUNTER — Encounter: Payer: Self-pay | Admitting: Physical Therapy

## 2019-06-13 ENCOUNTER — Encounter: Payer: Medicare Other | Admitting: Physical Therapy

## 2019-06-18 ENCOUNTER — Ambulatory Visit
Admission: RE | Admit: 2019-06-18 | Discharge: 2019-06-18 | Disposition: A | Discharge status: Home / Self Care | Payer: Medicare Other | Source: Ambulatory Visit | Attending: Internal Medicine | Admitting: Internal Medicine

## 2019-06-18 DIAGNOSIS — M810 Age-related osteoporosis without current pathological fracture: Secondary | ICD-10-CM

## 2019-06-21 ENCOUNTER — Other Ambulatory Visit
Admission: RE | Admit: 2019-06-21 | Discharge: 2019-06-21 | Disposition: A | Discharge status: Home / Self Care | Payer: Medicare Other | Source: Ambulatory Visit | Attending: Internal Medicine | Admitting: Internal Medicine

## 2019-06-21 ENCOUNTER — Other Ambulatory Visit: Payer: Self-pay

## 2019-06-21 DIAGNOSIS — Z20828 Contact with and (suspected) exposure to other viral communicable diseases: Secondary | ICD-10-CM | POA: Insufficient documentation

## 2019-06-22 LAB — SARS CORONAVIRUS 2 (TAT 6-24 HRS): SARS Coronavirus 2: NEGATIVE

## 2019-06-27 ENCOUNTER — Other Ambulatory Visit: Payer: Self-pay

## 2019-06-27 ENCOUNTER — Ambulatory Visit
Admission: RE | Admit: 2019-06-27 | Discharge: 2019-06-27 | Disposition: A | Discharge status: Home / Self Care | Payer: Medicare Other | Source: Ambulatory Visit | Attending: Obstetrics and Gynecology | Admitting: Obstetrics and Gynecology

## 2019-06-27 DIAGNOSIS — Z1231 Encounter for screening mammogram for malignant neoplasm of breast: Secondary | ICD-10-CM | POA: Diagnosis not present

## 2019-07-04 ENCOUNTER — Inpatient Hospital Stay: Payer: Medicare Other | Admitting: Adult Health

## 2019-07-08 ENCOUNTER — Encounter: Payer: Self-pay | Admitting: Physical Therapy

## 2019-07-08 DIAGNOSIS — M62838 Other muscle spasm: Secondary | ICD-10-CM

## 2019-07-08 DIAGNOSIS — M6281 Muscle weakness (generalized): Secondary | ICD-10-CM

## 2019-07-08 DIAGNOSIS — R293 Abnormal posture: Secondary | ICD-10-CM

## 2019-07-08 DIAGNOSIS — M542 Cervicalgia: Secondary | ICD-10-CM

## 2019-07-08 NOTE — Therapy (Signed)
[facility] [address] [place], [address] Phone: [phone]   Fax:  [phone]  Physical Therapy No-Visit Discharge Summary Reporting Period: 05/02/2019 - 07/08/2019  Patient Details  Name: Anna Castaneda MRN: [id number] Date of Birth: January 01, 1946 Referring Provider (PT):  Tracie Harrier, MD   Encounter Date: 07/08/2019    Past Medical History:  Diagnosis Date  . Arthritis 2018  . GERD (gastroesophageal reflux disease) 2005  . Myocardial infarction ([place]) 02/01/2016   Broken Heart Syndrome  . Osteoporosis 2018    Past Surgical History:  Procedure Laterality Date  . CATARACT EXTRACTION Bilateral 2004  . TONSILLECTOMY Bilateral 1954  . TUBAL LIGATION  1980    There were no vitals filed for this visit.  Subjective Assessment - 07/08/19 1332    Subjective  Patient's freind called to cancel her remaining appointments due to hospitlization. Patient will require new referral to resume physical therapy.    Pertinent History  Patient is a 73 y.o. female who presents to outpatient physical therapy with a referral for medical diagnosis neck pain and chronic low back pain. This patient's chief complaints consist of neck pain and thoracic spine pain, multi-joint instability and weaknes leading to the following functional deficits: difficulty sleeping, bending forward, reading, completing ADLs, and IADLs, and caring for a pet. 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    Limitations  Lifting;House hold activities;Other (comment);Sitting   difficulty sleeping, bending forward, reading, completing ADLs, and IADLs, and caring for a pet   Diagnostic tests  None except for many years ago possibly at chiropractic office. Does not remember anything significant.    Patient Stated Goals  get stronger and relieve pain    Pain Onset  More than a month ago       OBJECTIVE Patient not present for exam. Please see prior documentation for latest objective data.    PT Short Term Goals - 07/08/19 1334      PT SHORT TERM GOAL #1   Title  Be independent with initial home exercise program for self-management of symptoms.    Baseline  Initial HEP provided at IE (05/02/2019):    Time  2    Period  Weeks    Status  Unable to assess    Target Date  05/16/19        PT Long Term Goals - 07/08/19 1334      PT LONG TERM GOAL #1   Title  Be independent with a long-term home exercise program for self-management of symptoms.    Baseline  Initial HEP provided at initial eval (05/02/2019);    Time  6    Period  Weeks    Status  Not Met    Target Date  06/13/19      PT LONG TERM GOAL #2   Title  Reduce pain with functional activities to equal or less than 1/10 to allow patient to complete usual activities including ADLs, IADLs, and social engagement with less difficulty.    Baseline  4-5/10 (05/02/2019);    Time  6    Period  Weeks    Status  Unable to assess    Target Date  06/13/19      PT LONG TERM GOAL #3  Title  Have full cervical spine AROM with no compensations or increase in pain in all planes except intermittent end range discomfort to allow patient to complete valued activities with less difficulty.    Baseline  see objective exam (05/02/2019);    Time  6    Period  Weeks    Status  Not Met    Target Date  06/13/19      PT LONG TERM GOAL #4   Title  Complete community, work and/or recreational activities without limitation due to current condition.    Baseline  difficulty sleeping, bending forward, reading, completing ADLs, and IADLs, gardening, volunteering, and caring for a pet (05/02/2019);    Time  6    Period  Weeks     Status  Not Met    Target Date  06/13/19      PT LONG TERM GOAL #5   Title  Patient will demonstrate improved ability to perform functional tasks as exhibited by at least 10 point improvement in FOTO score    Baseline  to be measured visit 2 (05/02/2019);    Time  6    Period  Weeks    Status  Not Met    Target Date  06/13/19            Plan - 07/08/19 1337    Clinical Impression Statement  Patient attended 3 physical therapy sessions this episode of care. She was hospitalized for unrelated condition resulting in discontinuing physical therapy due to medical reasons prior to reaching her goals. Patient will require a new referral/order to resume care.    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   Examination-Participation Restrictions  Interpersonal Relationship;Driving;Yard Work;Cleaning;Volunteer;Laundry;Church;Community Activity    Stability/Clinical Decision Making  Evolving/Moderate complexity    Rehab Potential  Good    PT Frequency  2x / week    PT Duration  6 weeks    PT Treatment/Interventions  ADLs/Self Care Home Management;Cryotherapy;Electrical Stimulation;Moist Heat;Therapeutic activities;Therapeutic exercise;Neuromuscular re-education;Patient/family education;Manual techniques;Dry needling;Passive range of motion;Joint Manipulations;Other (comment);Spinal Manipulations   joint mobilizations grades I-IV   PT  Next Visit Plan  Patient is now discharged from physical therapy due to medical condition.    PT Home Exercise Plan  - chin tucks for deep cervical spine strengthening. - cervical retraction/flexion stretch    Consulted and Agree with Plan of Care  Patient       Patient will benefit from skilled therapeutic intervention in order to improve the following deficits and impairments:  Hypomobility, Increased muscle spasms, Impaired perceived functional ability, Improper body mechanics, Postural dysfunction, Impaired flexibility, Increased fascial restricitons, Decreased strength, Decreased coordination, Decreased activity tolerance, Decreased range of motion, Pain  Visit Diagnosis: Cervicalgia  Abnormal posture  Other muscle spasm  Muscle weakness (generalized)     Problem List Patient Active Problem List   Diagnosis Date Noted  . Seizures ([place]) 05/24/2019  . Fall 05/24/2019  . Thigh hematoma, right, sequela 05/24/2019  . GERD (gastroesophageal reflux disease) 05/24/2019  . Hypocalcemia 05/24/2019  . Hemorrhagic shock ([place]) 05/22/2019  . Essential hypertension 05/22/2019  .  Dyslipidemia 05/22/2019  . Hypokalemia 05/22/2019  . Stroke-like episode ([place]) s/p tPA 05/21/2019  . Hyponatremia    Everlean Alstrom. Graylon Good, PT, DPT 07/08/19, 1:38 PM  [facility] [address], [place], [address] Phone: [phone]   Fax:  [phone]  Name: Anna Castaneda MRN: [id number] Date of Birth: 15-Nov-1945

## 2019-08-28 ENCOUNTER — Other Ambulatory Visit: Payer: Self-pay

## 2019-08-28 NOTE — Patient Outreach (Signed)
First attempt to obtain mRs. Patient did not have a stroke. Will update spreadsheet.

## 2019-08-29 ENCOUNTER — Other Ambulatory Visit
Admission: RE | Admit: 2019-08-29 | Discharge: 2019-08-29 | Disposition: A | Discharge status: Home / Self Care | Payer: Medicare Other | Source: Ambulatory Visit | Attending: Internal Medicine | Admitting: Internal Medicine

## 2019-08-29 ENCOUNTER — Other Ambulatory Visit: Payer: Self-pay

## 2019-08-29 DIAGNOSIS — Z01812 Encounter for preprocedural laboratory examination: Secondary | ICD-10-CM | POA: Insufficient documentation

## 2019-08-29 DIAGNOSIS — Z20828 Contact with and (suspected) exposure to other viral communicable diseases: Secondary | ICD-10-CM | POA: Diagnosis not present

## 2019-08-29 DIAGNOSIS — Z20822 Contact with and (suspected) exposure to covid-19: Secondary | ICD-10-CM

## 2019-08-29 LAB — SARS CORONAVIRUS 2 (TAT 6-24 HRS): SARS Coronavirus 2: NEGATIVE

## 2019-08-30 ENCOUNTER — Encounter: Payer: Self-pay | Admitting: *Deleted

## 2019-09-02 ENCOUNTER — Ambulatory Visit
Admission: RE | Admit: 2019-09-02 | Discharge: 2019-09-02 | Disposition: A | Discharge status: Home / Self Care | Payer: Medicare Other | Attending: Internal Medicine | Admitting: Internal Medicine

## 2019-09-02 ENCOUNTER — Ambulatory Visit: Payer: Medicare Other | Admitting: Certified Registered Nurse Anesthetist

## 2019-09-02 ENCOUNTER — Encounter
Admission: RE | Disposition: A | Discharge status: Home / Self Care | Payer: Self-pay | Source: Home / Self Care | Attending: Internal Medicine

## 2019-09-02 ENCOUNTER — Encounter: Payer: Self-pay | Admitting: Emergency Medicine

## 2019-09-02 ENCOUNTER — Other Ambulatory Visit: Payer: Self-pay

## 2019-09-02 DIAGNOSIS — M810 Age-related osteoporosis without current pathological fracture: Secondary | ICD-10-CM | POA: Diagnosis not present

## 2019-09-02 DIAGNOSIS — I10 Essential (primary) hypertension: Secondary | ICD-10-CM | POA: Insufficient documentation

## 2019-09-02 DIAGNOSIS — K219 Gastro-esophageal reflux disease without esophagitis: Secondary | ICD-10-CM | POA: Diagnosis not present

## 2019-09-02 DIAGNOSIS — E785 Hyperlipidemia, unspecified: Secondary | ICD-10-CM | POA: Diagnosis not present

## 2019-09-02 DIAGNOSIS — K5900 Constipation, unspecified: Secondary | ICD-10-CM | POA: Diagnosis not present

## 2019-09-02 DIAGNOSIS — R569 Unspecified convulsions: Secondary | ICD-10-CM | POA: Insufficient documentation

## 2019-09-02 DIAGNOSIS — K591 Functional diarrhea: Secondary | ICD-10-CM | POA: Insufficient documentation

## 2019-09-02 DIAGNOSIS — Z87891 Personal history of nicotine dependence: Secondary | ICD-10-CM | POA: Diagnosis not present

## 2019-09-02 DIAGNOSIS — Q438 Other specified congenital malformations of intestine: Secondary | ICD-10-CM | POA: Insufficient documentation

## 2019-09-02 DIAGNOSIS — Z79899 Other long term (current) drug therapy: Secondary | ICD-10-CM | POA: Diagnosis not present

## 2019-09-02 DIAGNOSIS — I252 Old myocardial infarction: Secondary | ICD-10-CM | POA: Diagnosis not present

## 2019-09-02 DIAGNOSIS — Z7982 Long term (current) use of aspirin: Secondary | ICD-10-CM | POA: Insufficient documentation

## 2019-09-02 HISTORY — PX: COLONOSCOPY WITH PROPOFOL: SHX5780

## 2019-09-02 HISTORY — DX: Hyperlipidemia, unspecified: E78.5

## 2019-09-02 HISTORY — DX: Essential (primary) hypertension: I10

## 2019-09-02 SURGERY — COLONOSCOPY WITH PROPOFOL
Anesthesia: General

## 2019-09-02 MED ORDER — GLYCOPYRROLATE 0.2 MG/ML IJ SOLN
INTRAMUSCULAR | Status: DC | PRN
  Administered 2019-09-02 (×2): 0.1 mg via INTRAVENOUS

## 2019-09-02 MED ORDER — PHENYLEPHRINE HCL (PRESSORS) 10 MG/ML IV SOLN
INTRAVENOUS | Status: DC | PRN
  Administered 2019-09-02: 100 ug via INTRAVENOUS

## 2019-09-02 MED ORDER — PROPOFOL 10 MG/ML IV BOLUS
INTRAVENOUS | Status: DC | PRN
  Administered 2019-09-02: 50 mg via INTRAVENOUS

## 2019-09-02 MED ORDER — SODIUM CHLORIDE 0.9 % IV SOLN
INTRAVENOUS | Status: DC
  Administered 2019-09-02: 1000 mL via INTRAVENOUS

## 2019-09-02 MED ORDER — PROPOFOL 500 MG/50ML IV EMUL
INTRAVENOUS | Status: DC | PRN
  Administered 2019-09-02: 160 ug/kg/min via INTRAVENOUS

## 2019-09-02 NOTE — Op Note (Signed)
[facility] Patient Name: Anna Castaneda Procedure Date: 09/02/2019 9:23 AM MRN: [id number] Account #: [id number] Date of Birth: 19-Aug-1946 Admit Type: Outpatient Age: 73 Room: [facility] ENDO ROOM 1 Gender: Female Note Status: Finalized Procedure:            Colonoscopy Indications:          Functional diarrhea Providers:            Benay Pike. Elissa Grieshop MD, MD Medicines:            Propofol per Anesthesia Complications:        No immediate complications. Procedure:            Pre-Anesthesia Assessment:                       - The risks and benefits of the procedure and the                        sedation options and risks were discussed with the                        patient. All questions were answered and informed                        consent was obtained.                       - Patient identification and proposed procedure were                        verified prior to the procedure by the nurse. The                        procedure was verified in the procedure room.                       - ASA Grade Assessment: III - A patient with severe                        systemic disease.                       - After reviewing the risks and benefits, the patient                        was deemed in satisfactory condition to undergo the                        procedure.                       After obtaining informed consent, the colonoscope was                        passed under direct vision. Throughout the procedure,                        the patient's blood pressure, pulse, and oxygen                        saturations were monitored continuously. The  Colonoscope was introduced through the anus and                        advanced to the the cecum, identified by appendiceal                        orifice and ileocecal valve. The colonoscopy was                        somewhat difficult due to a tortuous colon. Successful                  completion of the procedure was aided by withdrawing                        and reinserting the scope. The patient tolerated the                        procedure well. The quality of the bowel preparation                        was adequate. The ileocecal valve, appendiceal orifice,                        and rectum were photographed. Findings:      The perianal and digital rectal examinations were normal. Pertinent       negatives include normal sphincter tone and no palpable rectal lesions.      Normal mucosa was found in the entire colon. Biopsies for histology were       taken with a cold forceps from the random colon for evaluation of       microscopic colitis.      The exam was otherwise without abnormality on direct and retroflexion       views. Impression:           - Normal mucosa in the entire examined colon. Biopsied.                       - The examination was otherwise normal on direct and                        retroflexion views. Recommendation:       - Patient has a contact number available for                        emergencies. The signs and symptoms of potential                        delayed complications were discussed with the patient.                        Return to normal activities tomorrow. Written discharge                        instructions were provided to the patient.                       - Resume previous diet.                       - Continue present  medications.                       - Await pathology results.                       - No repeat colonoscopy due to current age (44 years or                        older) and the absence of colonic polyps.                       - Return to physician assistant in 2 months.                       - You will be seen by Jacob Moores, PA-C for your                        follow up visit in the office. 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 All rights reserved. The codes documented in this report are preliminary and upon coder review may  be revised to meet current compliance requirements. Stanton Kidney MD, MD 09/02/2019 10:12:58 AM This report has been signed electronically. Number of Addenda: 0 Note Initiated On: 09/02/2019 9:23 AM Scope Withdrawal Time: 0 hours 12 minutes 29 seconds  Total Procedure Duration: 0 hours 24 minutes 33 seconds  Estimated Blood Loss: Estimated blood loss: none.      [facility]

## 2019-09-02 NOTE — Anesthesia Procedure Notes (Signed)
Performed by: Ethel Meisenheimer, CRNA Pre-anesthesia Checklist: Patient identified, Suction available, Emergency Drugs available, Patient being monitored and Timeout performed Patient Re-evaluated:Patient Re-evaluated prior to induction Oxygen Delivery Method: Nasal cannula Induction Type: IV induction       

## 2019-09-02 NOTE — Anesthesia Preprocedure Evaluation (Signed)
Anesthesia Evaluation  Patient identified by MRN, date of birth, ID band Patient awake    Reviewed: Allergy & Precautions, H&P , NPO status , Patient's Chart, lab work & pertinent test results, reviewed documented beta blocker date and time   Airway Mallampati: II   Neck ROM: full    Dental  (+) Poor Dentition   Pulmonary neg pulmonary ROS, former smoker,    Pulmonary exam normal        Cardiovascular Exercise Tolerance: Good hypertension, On Medications + Past MI  negative cardio ROS Normal cardiovascular exam Rhythm:regular Rate:Normal     Neuro/Psych Seizures -,  negative neurological ROS  negative psych ROS   GI/Hepatic negative GI ROS, Neg liver ROS, GERD  Medicated,  Endo/Other  negative endocrine ROS  Renal/GU negative Renal ROS  negative genitourinary   Musculoskeletal   Abdominal   Peds  Hematology negative hematology ROS (+)   Anesthesia Other Findings Past Medical History: 2018: Arthritis 2005: GERD (gastroesophageal reflux disease) No date: Hyperlipidemia No date: Hypertension 02/01/2016: Myocardial infarction [facility])     Comment:  Broken Heart Syndrome 2018: Osteoporosis Past Surgical History: 2004: CATARACT EXTRACTION; Bilateral 1954: TONSILLECTOMY; Bilateral 1980: TUBAL LIGATION BMI    Body Mass Index: 19.97 kg/m     Reproductive/Obstetrics negative OB ROS                             Anesthesia Physical Anesthesia Plan  ASA: III  Anesthesia Plan: General   Post-op Pain Management:    Induction:   PONV Risk Score and Plan:   Airway Management Planned:   Additional Equipment:   Intra-op Plan:   Post-operative Plan:   Informed Consent: I have reviewed the patients History and Physical, chart, labs and discussed the procedure including the risks, benefits and alternatives for the proposed anesthesia with the patient or authorized representative who has  indicated his/her understanding and acceptance.     Dental Advisory Given  Plan Discussed with: CRNA  Anesthesia Plan Comments:         Anesthesia Quick Evaluation

## 2019-09-02 NOTE — Anesthesia Post-op Follow-up Note (Signed)
Anesthesia QCDR form completed.        

## 2019-09-02 NOTE — Interval H&P Note (Signed)
History and Physical Interval Note:  09/02/2019 9:27 AM  Anna Castaneda  has presented today for surgery, with the diagnosis of Bates in bowel habits.  The various methods of treatment have been discussed with the patient and family. After consideration of risks, benefits and other options for treatment, the patient has consented to  Procedure(s): COLONOSCOPY WITH PROPOFOL (N/A) as a surgical intervention.  The patient's history has been reviewed, patient examined, no change in status, stable for surgery.  I have reviewed the patient's chart and labs.  Questions were answered to the patient's satisfaction.     [place], [place]

## 2019-09-02 NOTE — H&P (Signed)
Outpatient short stay form Pre-procedure 09/02/2019 9:25 AM Teodoro K. Norma Fredrickson, M.D.  Primary Physician: Barbette Reichmann, M.D.  Reason for visit:  Change in bowel habits, colon cancer screening  History of present illness:  Patient presents for colonoscopy for colon cancer screening. The patient denies complaints of abdominal pain, significant weight loss or rectal bleeding. Patient has some intermittent diarrhea and constipation somewhat improved with fiber supplements.     Current Facility-Administered Medications:  .  0.9 %  sodium chloride infusion, , Intravenous, Continuous, [place], Boykin Nearing, MD, Last Rate: 20 mL/hr at 09/02/19 0857, 1,000 mL at 09/02/19 0857  Medications Prior to Admission  Medication Sig Dispense Refill Last Dose  . ALPHA-D-GALACTOSIDASE PO Take 300 Units by mouth.   09/01/2019 at Unknown time  . aluminum hydroxide-magnesium carbonate (GAVISCON) 95-358 mg/15 mL SUSP Take by mouth every evening. nightly   Past Week at Unknown time  . aspirin EC 81 MG tablet Take 81 mg by mouth daily.   09/01/2019 at Unknown time  . atorvastatin (LIPITOR) 20 MG tablet Take 20 mg by mouth daily.   09/02/2019 at Unknown time  . Biotin 5 MG TABS Take 10,000 mg by mouth daily.   09/01/2019 at Unknown time  . bismuth subsalicylate (PEPTO BISMOL) 262 MG/15ML suspension Take by mouth.   Past Week at Unknown time  . CALCIUM LACTATE PO Take by mouth.   09/01/2019 at Unknown time  . cetirizine (ZYRTEC) 10 MG tablet Take 10 mg by mouth daily as needed for allergies.    09/01/2019 at Unknown time  . Cholecalciferol (VITAMIN D3) 50 MCG (2000 UT) capsule Take 2,000 Units by mouth daily.   Past Week at Unknown time  . co-enzyme Q-10 50 MG capsule Take 200 mg by mouth daily. Ubidecarenone (Co-Enzyme Q-10, Ubiquinone) 200 tablet   09/01/2019 at Unknown time  . denosumab (PROLIA) 60 MG/ML SOSY injection Inject 60 mg into the skin every 6 (six) months.   09/01/2019 at Unknown time  . FERROUS FUM-IRON  POLYSACCH PO Take by mouth. Iron fum, poly #1-vitC-L.casei 130 mg iron-25 mg-30 mg Cap   Past Week at Unknown time  . levETIRAcetam (KEPPRA XR) 500 MG 24 hr tablet Take 1 tablet (500 mg total) by mouth daily. 30 tablet 2 09/02/2019 at Unknown time  . lisinopril (ZESTRIL) 5 MG tablet Take 5 mg by mouth daily.   09/01/2019 at Unknown time  . metronidazole (NORITATE) 1 % cream Apply 1 application topically See admin instructions. APP TO FACE ONCE DAILY   09/01/2019 at Unknown time  . Multiple Vitamin (MULTIVITAMIN PO) Take by mouth. Multivitamin with B complex-vitamin C (FARBEE WITH C) tablet   Past Week at Unknown time  . NON FORMULARY Take by mouth gelatin sponge, absorb/porcine (Gelatine Absorbable MISC)   09/01/2019 at Unknown time  . NON FORMULARY Take by mouth C, E, zinc, copper 11/omega3s/lut (OCUVITE ADULT50+ ORAL)   09/01/2019 at Unknown time  . NON FORMULARY Benefiber, Guar gum, Oral   09/01/2019 at Unknown time  . NON FORMULARY Calm with Calcium   09/01/2019 at Unknown time  . NON FORMULARY CocoaVia   09/01/2019 at Unknown time  . pantoprazole (PROTONIX) 20 MG tablet Take 20 mg by mouth daily.   09/02/2019 at Unknown time  . SIMETHICONE PO Take 1 tablet by mouth every 6 (six) hours as needed (flutulence). GAS-X ORAL)    09/01/2019 at Unknown time  . metronidazole (NORITATE) 1 % cream Apply topically daily. Apply to face once daily  Allergies  Allergen Reactions  . Lactose Intolerance (Gi) Diarrhea     Past Medical History:  Diagnosis Date  . Arthritis 2018  . GERD (gastroesophageal reflux disease) 2005  . Hyperlipidemia   . Hypertension   . Myocardial infarction ([place]) 02/01/2016   Broken Heart Syndrome  . Osteoporosis 2018    Review of systems:  Otherwise negative.    Physical Exam  Gen: Alert, oriented. Appears stated age.  HEENT: [place]/AT. PERRLA. Lungs: CTA, no wheezes. CV: RR nl S1, S2. Abd: soft, benign, no masses. BS+ Ext: No edema. Pulses 2+    Planned  procedures: Proceed with colonoscopy. The patient understands the nature of the planned procedure, indications, risks, alternatives and potential complications including but not limited to bleeding, infection, perforation, damage to internal organs and possible oversedation/side effects from anesthesia. The patient agrees and gives consent to proceed.  Please refer to procedure notes for findings, recommendations and patient disposition/instructions.     Teodoro K. Alice Reichert, M.D. Gastroenterology 09/02/2019  9:25 AM

## 2019-09-02 NOTE — Anesthesia Postprocedure Evaluation (Signed)
Anesthesia Post Note  Patient: Anna Castaneda  Procedure(s) Performed: COLONOSCOPY WITH PROPOFOL (N/A )  Patient location during evaluation: PACU Anesthesia Type: General Level of consciousness: awake and alert Pain management: pain level controlled Vital Signs Assessment: post-procedure vital signs reviewed and stable Respiratory status: spontaneous breathing, nonlabored ventilation, respiratory function stable and patient connected to nasal cannula oxygen Cardiovascular status: blood pressure returned to baseline and stable Postop Assessment: no apparent nausea or vomiting Anesthetic complications: no     Last Vitals:  Vitals:   09/02/19 1013 09/02/19 1032  BP: 99/73 122/67  Pulse: 67   Resp: 12   Temp: (!) 35.7 C   SpO2: 98%     Last Pain:  Vitals:   09/02/19 1043  TempSrc:   PainSc: 0-No pain                 Molli Barrows

## 2019-09-02 NOTE — Transfer of Care (Signed)
Immediate Anesthesia Transfer of Care Note  Patient: Anna Castaneda  Procedure(s) Performed: COLONOSCOPY WITH PROPOFOL (N/A )  Patient Location: PACU  Anesthesia Type:General  Level of Consciousness: awake, alert  and oriented  Airway & Oxygen Therapy: Patient connected to nasal cannula oxygen  Post-op Assessment: Post -op Vital signs reviewed and stable  Post vital signs: stable  Last Vitals:  Vitals Value Taken Time  BP 99/73 09/02/19 1013  Temp    Pulse 64 09/02/19 1013  Resp 20 09/02/19 1013  SpO2 97 % 09/02/19 1013  Vitals shown include unvalidated device data.  Last Pain:  Vitals:   09/02/19 1013  TempSrc:   PainSc: 0-No pain         Complications: No apparent anesthesia complications

## 2019-09-04 LAB — SURGICAL PATHOLOGY

## 2020-05-19 ENCOUNTER — Other Ambulatory Visit: Payer: Self-pay | Admitting: Obstetrics and Gynecology

## 2020-05-19 DIAGNOSIS — Z1231 Encounter for screening mammogram for malignant neoplasm of breast: Secondary | ICD-10-CM

## 2020-07-01 ENCOUNTER — Other Ambulatory Visit: Payer: Self-pay

## 2020-07-01 ENCOUNTER — Ambulatory Visit
Admission: RE | Admit: 2020-07-01 | Discharge: 2020-07-01 | Disposition: A | Discharge status: Home / Self Care | Payer: Medicare Other | Source: Ambulatory Visit | Attending: Obstetrics and Gynecology | Admitting: Obstetrics and Gynecology

## 2020-07-01 DIAGNOSIS — Z1231 Encounter for screening mammogram for malignant neoplasm of breast: Secondary | ICD-10-CM | POA: Insufficient documentation

## 2020-11-18 IMAGING — CT CT HEAD CODE STROKE W/O CM
4 series · 15 of 47 positions shown, 17 images · non-contrast
Comparison: None.

CLINICAL DATA: Code stroke.  Right facial droop and aphasia.

EXAM:
CT HEAD WITHOUT CONTRAST
TECHNIQUE: Contiguous axial images were obtained from the base of the skull
through the vertex without intravenous contrast.

[Series 3: head wo · axial · 0.41mm/px · z∈[-146,-26]mm · 7 of 34 slices shown, 9 images]
[im 5/34  brain]
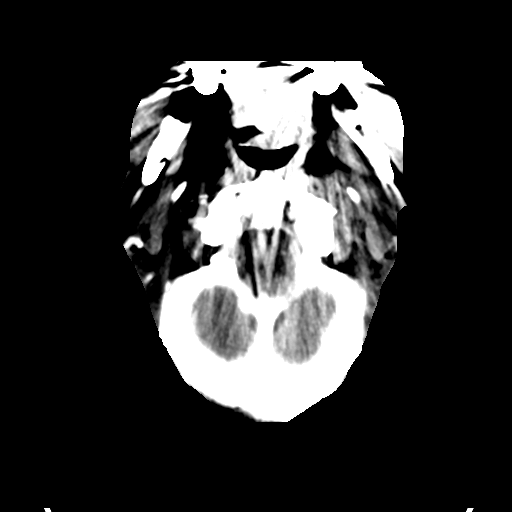
[im 5/34  bone]
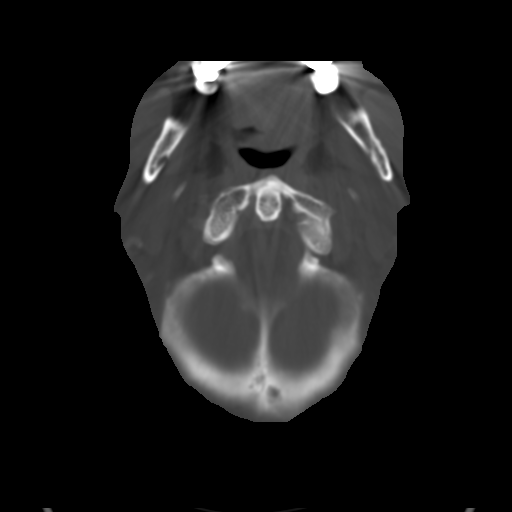
[im 9/34  brain]
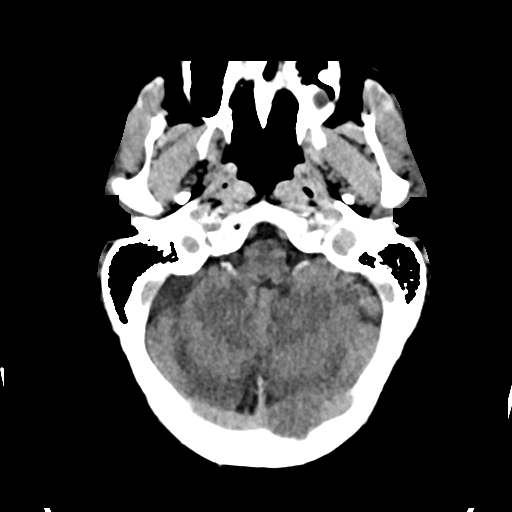
[im 13/34  brain]
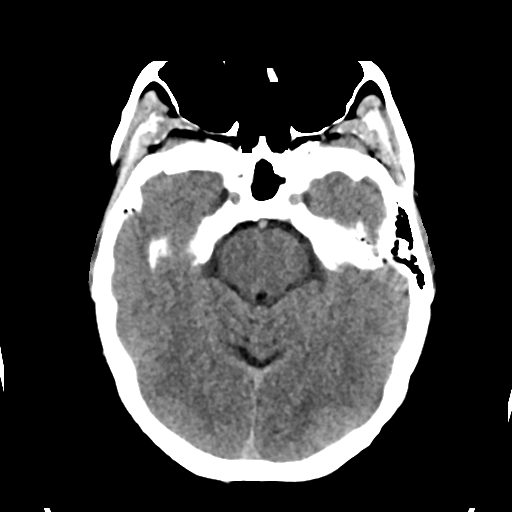
[im 17/34  brain]
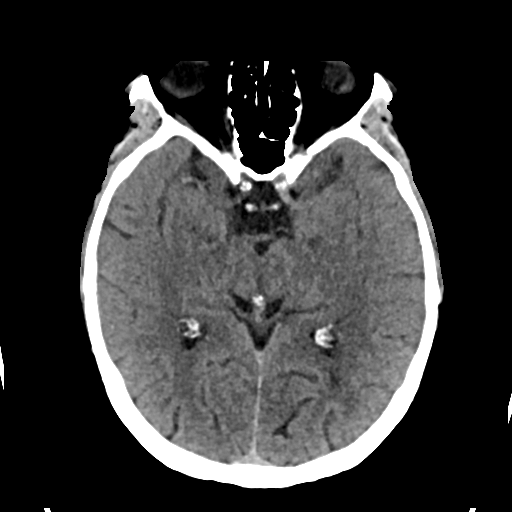
[im 21/34  brain]
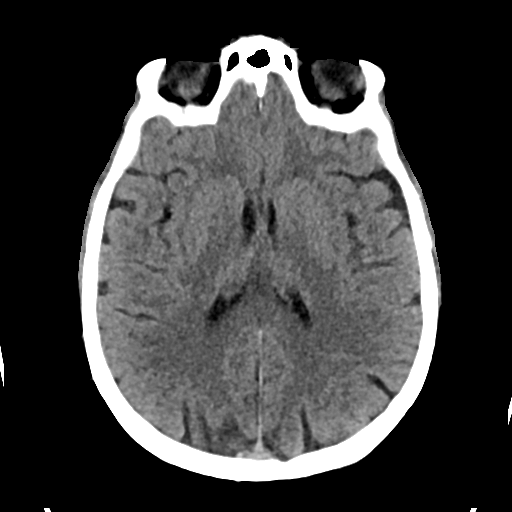
[im 21/34  bone]
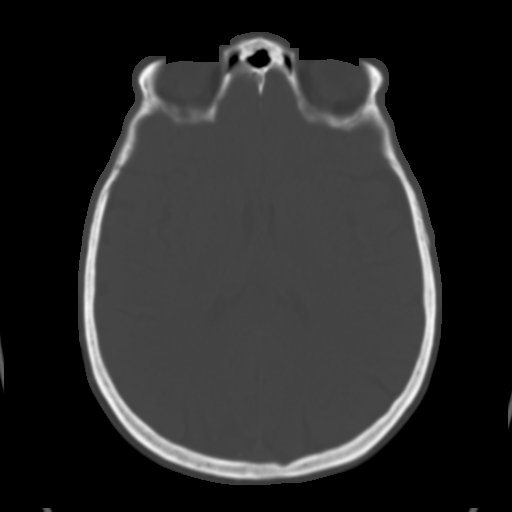
[im 25/34  brain]
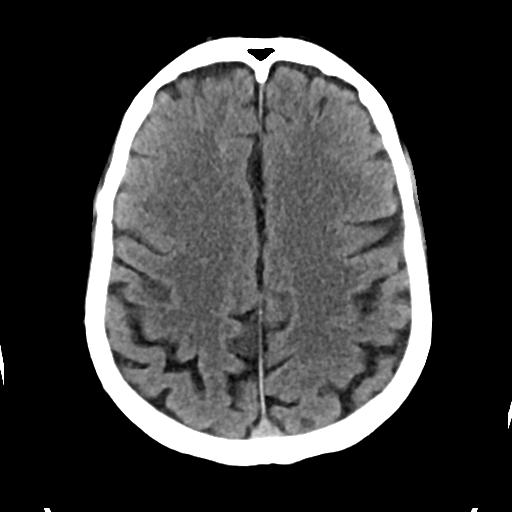
[im 29/34  brain]
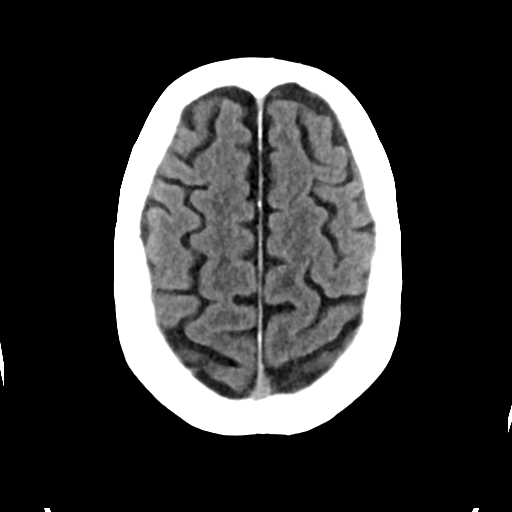

[Series 4: head bone · axial · 0.41mm/px · z∈[-150,-134]mm · 2 of 84 slices shown]
[im 9/84  bone]
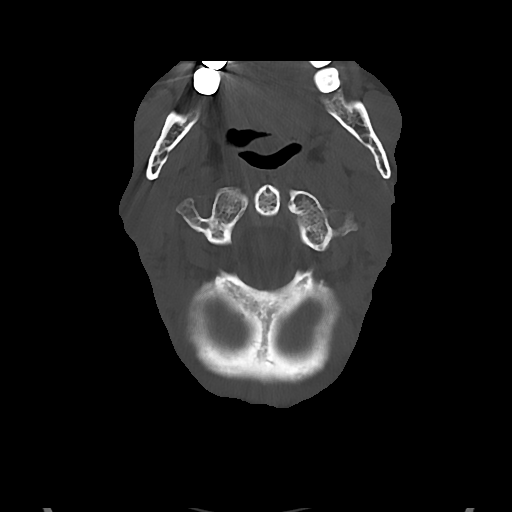
[im 17/84  bone]
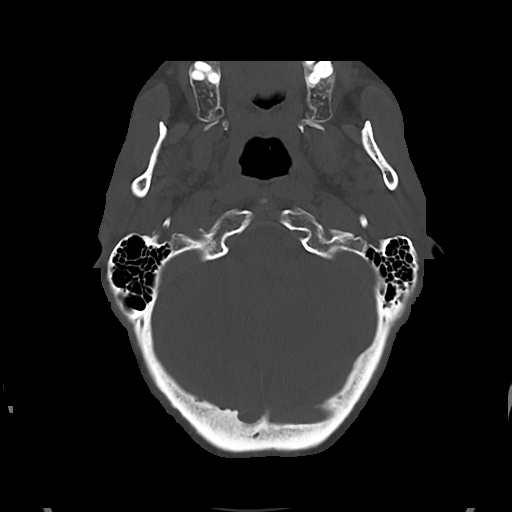

[Series 5: cor soft · coronal · 0.33mm/px · 3 of 75 slices shown]
[im 25/75  brain]
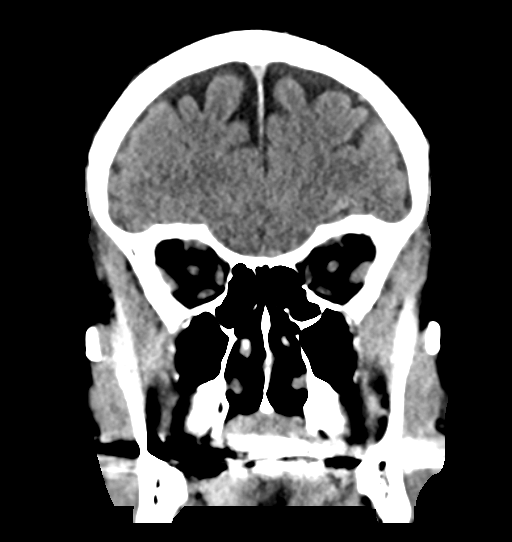
[im 33/75  brain]
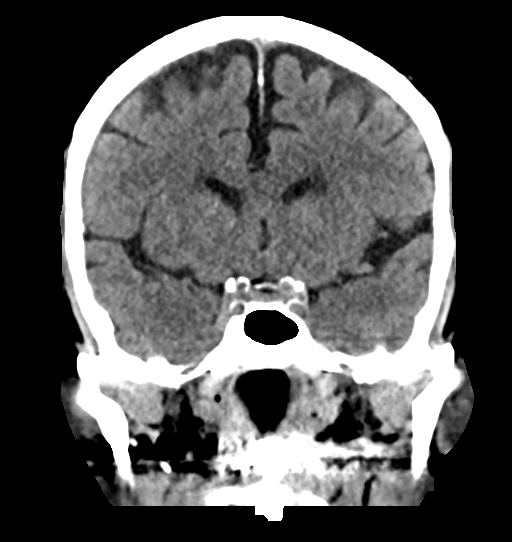
[im 42/75  brain]
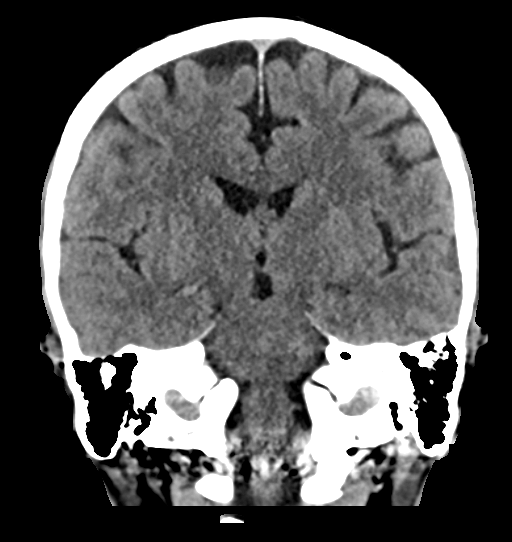

[Series 6: sag soft · sagittal · 0.33mm/px · 3 of 53 slices shown]
[im 18/53  brain]
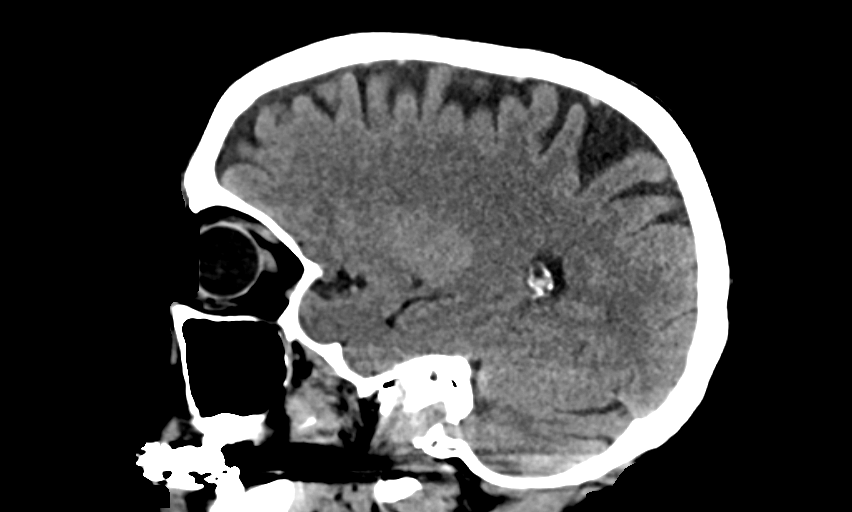
[im 27/53  brain]
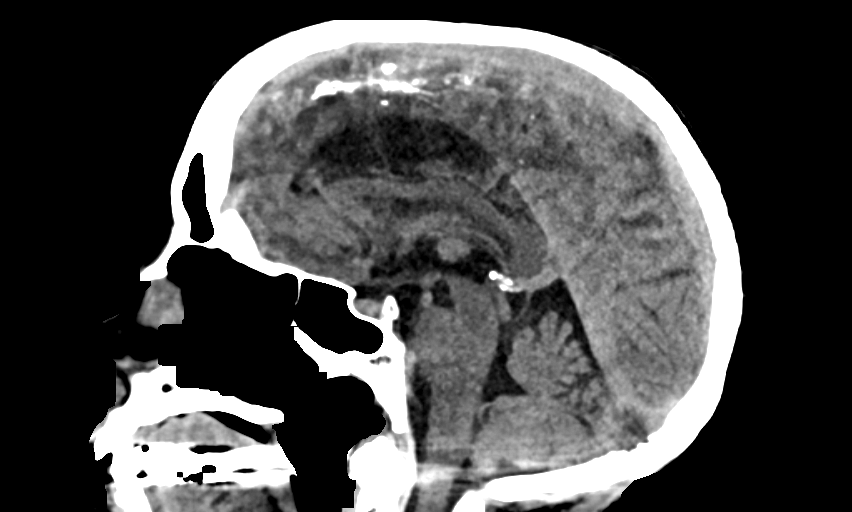
[im 35/53  brain]
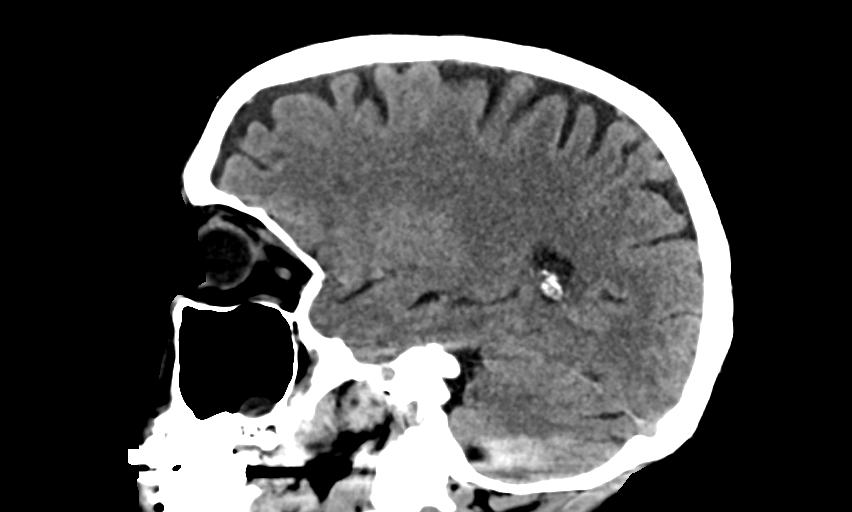

[15 of 47 positions shown; findings below may reference images not displayed]

FINDINGS: Brain: Age related volume loss. There chronic small-vessel ischemic
changes affecting the hemispheric white matter of a mild degree. No
sign of acute infarction, mass lesion, hemorrhage, hydrocephalus or
extra-axial collection.

Vascular: No convincing hyperdense vessel. One could question a
hyperdense focus in the left M1 segment region.

Skull: Negative

Sinuses/Orbits: Clear/normal

Other: None

ASPECTS (Alberta Stroke Program Early CT Score)

- Ganglionic level infarction (caudate, lentiform nuclei, internal
capsule, insula, M1-M3 cortex): 7

- Supraganglionic infarction (M4-M6 cortex): 3

Total score (0-10 with 10 being normal): 10
IMPRESSION: 1. Mild chronic small-vessel change of the hemispheric white matter.
No acute infarction seen. No hemorrhage. One could question a small
hyperdense focus in the left M1 segment.
2. ASPECTS is 10.
3. These results were communicated to Dr. Guayre at [DATE] pmon
05/21/2019by text page via the [employer] messaging system.

## 2020-11-19 IMAGING — DX DG HIP (WITH OR WITHOUT PELVIS) 1V PORT RIGHT
1 series · 1 of 1 positions shown · non-contrast
Comparison: None.

CLINICAL DATA: Recent trauma.  Hematoma.

EXAM:
DG HIP (WITH OR WITHOUT PELVIS) 1V PORT RIGHT

[pelvis ap]
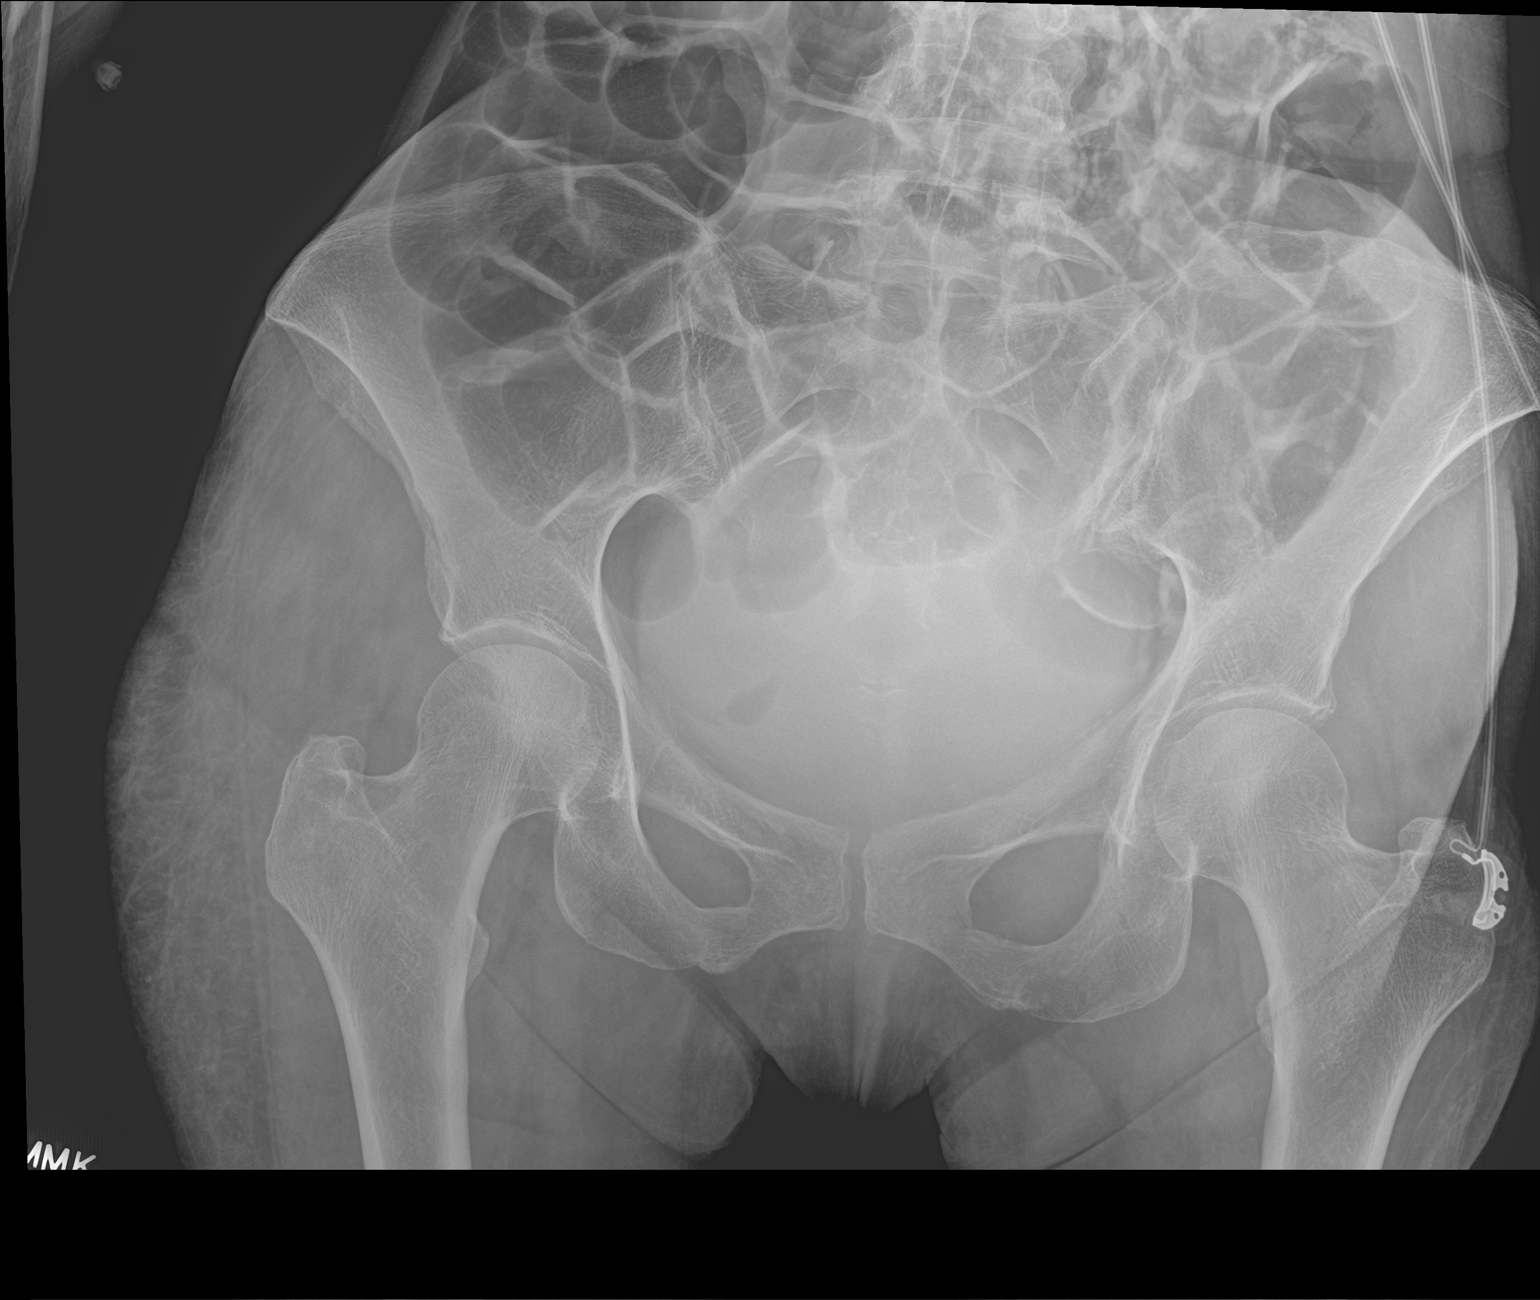

[1 of 1 positions shown; findings below may reference images not displayed]

FINDINGS: Soft tissue edema is seen over the right hip. No fractures are seen.
IMPRESSION: Soft tissue edema over the right hip.  No fractures.

## 2020-11-19 IMAGING — DX RIGHT FEMUR PORTABLE 1 VIEW
2 series · 2 of 2 positions shown · non-contrast
Comparison: none

:
Two views of the right hip

No comparison
HISTORY: Hematoma after tPA.  Recent trauma.
TECHNIQUE: Two AP views are obtained of the right fever.

[femur ap (1 of 2)]
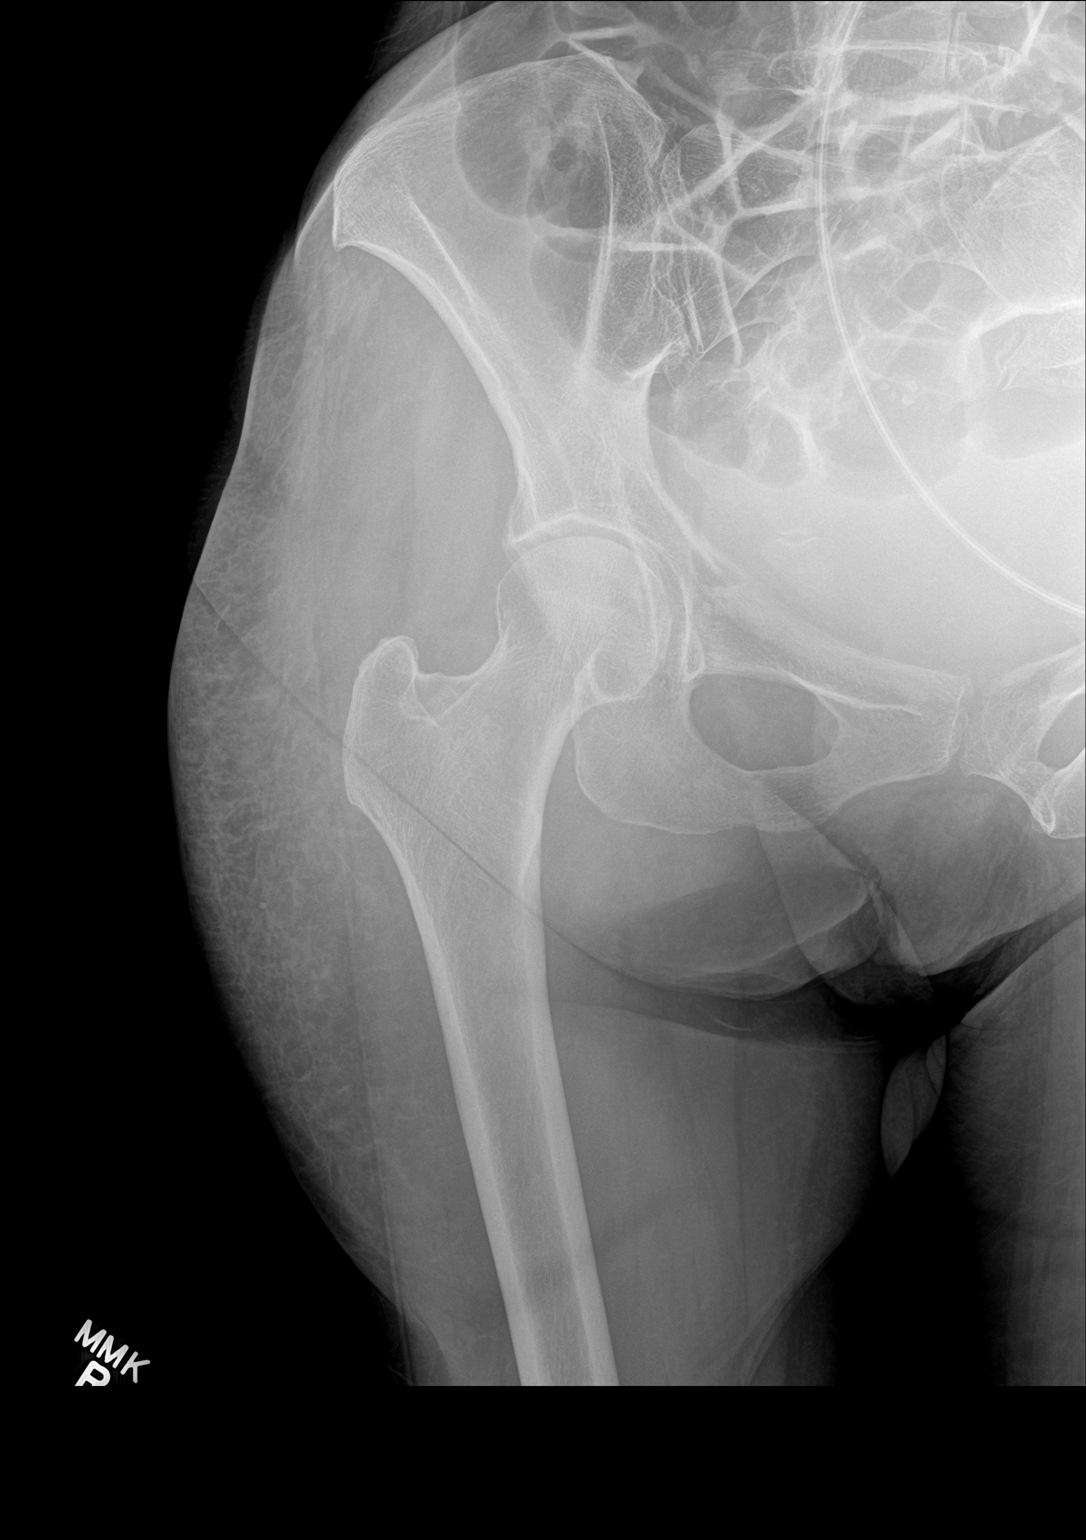

[femur ap (2 of 2)]
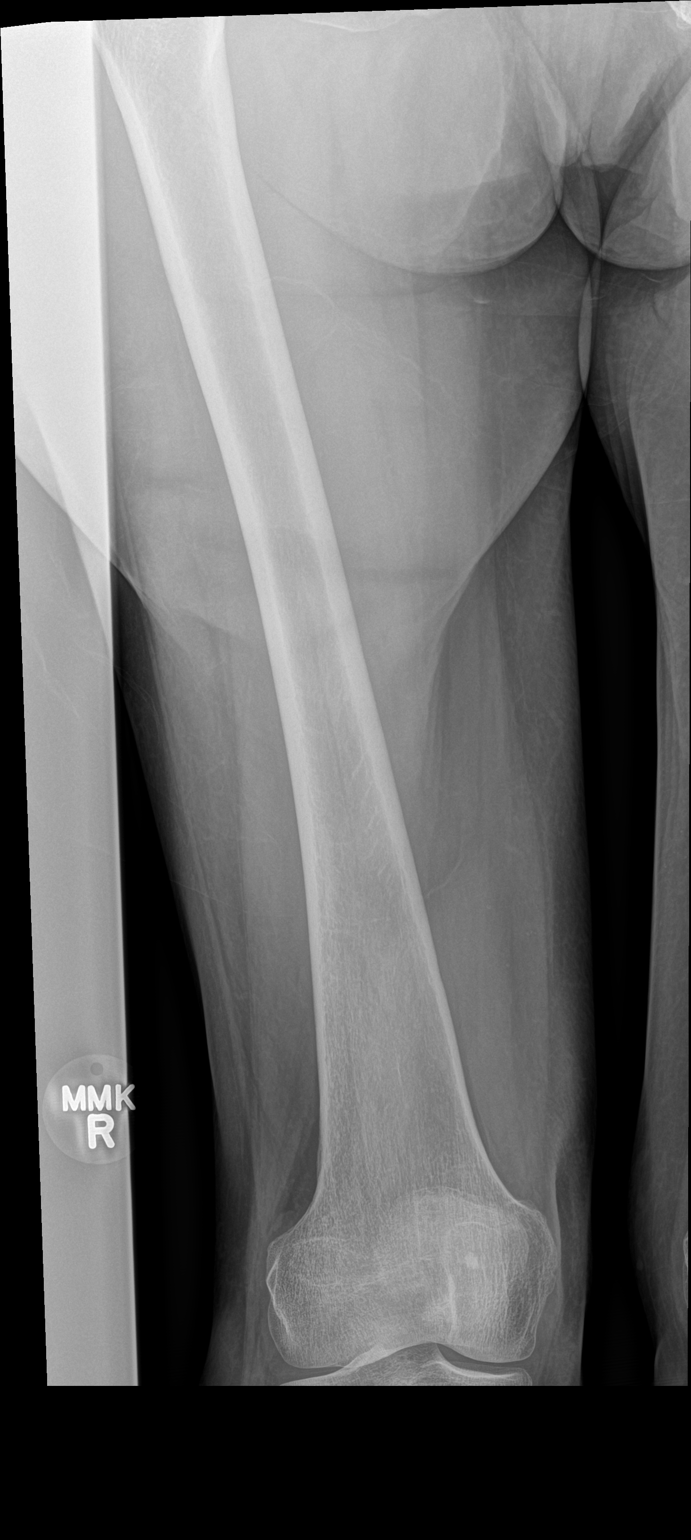

[2 of 2 positions shown; findings below may reference images not displayed]

FINDINGS: Soft tissue edema is seen over the right side of the pelvis and
upper thigh. No fractures noted.
IMPRESSION: Soft tissue edema.  No identified fracture.

## 2021-04-09 ENCOUNTER — Other Ambulatory Visit: Payer: Self-pay | Admitting: Internal Medicine

## 2021-04-09 DIAGNOSIS — M810 Age-related osteoporosis without current pathological fracture: Secondary | ICD-10-CM

## 2021-05-24 ENCOUNTER — Other Ambulatory Visit: Payer: Self-pay | Admitting: Obstetrics and Gynecology

## 2021-05-25 ENCOUNTER — Other Ambulatory Visit: Payer: Self-pay | Admitting: Obstetrics and Gynecology

## 2021-05-25 DIAGNOSIS — Z1231 Encounter for screening mammogram for malignant neoplasm of breast: Secondary | ICD-10-CM

## 2021-07-06 ENCOUNTER — Other Ambulatory Visit: Payer: Medicare Other

## 2021-07-13 ENCOUNTER — Ambulatory Visit
Admission: RE | Admit: 2021-07-13 | Discharge: 2021-07-13 | Disposition: A | Discharge status: Home / Self Care | Payer: Medicare Other | Source: Ambulatory Visit | Attending: Internal Medicine | Admitting: Internal Medicine

## 2021-07-13 ENCOUNTER — Ambulatory Visit
Admission: RE | Admit: 2021-07-13 | Discharge: 2021-07-13 | Disposition: A | Discharge status: Home / Self Care | Payer: Medicare Other | Source: Ambulatory Visit | Attending: Obstetrics and Gynecology | Admitting: Obstetrics and Gynecology

## 2021-07-13 ENCOUNTER — Other Ambulatory Visit: Payer: Self-pay

## 2021-07-13 DIAGNOSIS — M810 Age-related osteoporosis without current pathological fracture: Secondary | ICD-10-CM | POA: Diagnosis present

## 2021-07-13 DIAGNOSIS — Z1231 Encounter for screening mammogram for malignant neoplasm of breast: Secondary | ICD-10-CM | POA: Insufficient documentation

## 2021-10-21 ENCOUNTER — Other Ambulatory Visit: Payer: Self-pay | Admitting: Internal Medicine

## 2021-10-21 ENCOUNTER — Other Ambulatory Visit (HOSPITAL_COMMUNITY): Payer: Self-pay | Admitting: Internal Medicine

## 2021-10-21 DIAGNOSIS — R200 Anesthesia of skin: Secondary | ICD-10-CM

## 2021-10-21 DIAGNOSIS — M5412 Radiculopathy, cervical region: Secondary | ICD-10-CM

## 2021-10-21 DIAGNOSIS — M549 Dorsalgia, unspecified: Secondary | ICD-10-CM

## 2021-10-31 ENCOUNTER — Ambulatory Visit
Admission: RE | Admit: 2021-10-31 | Discharge: 2021-10-31 | Disposition: A | Discharge status: Home / Self Care | Payer: Medicare Other | Source: Ambulatory Visit | Attending: Internal Medicine | Admitting: Internal Medicine

## 2021-10-31 ENCOUNTER — Other Ambulatory Visit: Payer: Self-pay

## 2021-10-31 DIAGNOSIS — M549 Dorsalgia, unspecified: Secondary | ICD-10-CM | POA: Insufficient documentation

## 2021-10-31 DIAGNOSIS — M5412 Radiculopathy, cervical region: Secondary | ICD-10-CM | POA: Diagnosis present

## 2021-10-31 DIAGNOSIS — R200 Anesthesia of skin: Secondary | ICD-10-CM | POA: Diagnosis present

## 2021-10-31 MED ORDER — GADOBUTROL 1 MMOL/ML IV SOLN
5.0000 mL | Freq: Once | INTRAVENOUS | Status: AC | PRN
  Administered 2021-10-31: 11:00:00 7.5 mL via INTRAVENOUS

## 2022-06-07 ENCOUNTER — Other Ambulatory Visit: Payer: Self-pay | Admitting: Obstetrics and Gynecology

## 2022-06-07 DIAGNOSIS — Z1231 Encounter for screening mammogram for malignant neoplasm of breast: Secondary | ICD-10-CM

## 2022-07-15 ENCOUNTER — Ambulatory Visit
Admission: RE | Admit: 2022-07-15 | Discharge: 2022-07-15 | Disposition: A | Discharge status: Home / Self Care | Payer: Medicare Other | Source: Ambulatory Visit | Attending: Obstetrics and Gynecology | Admitting: Obstetrics and Gynecology

## 2022-07-15 DIAGNOSIS — Z1231 Encounter for screening mammogram for malignant neoplasm of breast: Secondary | ICD-10-CM | POA: Diagnosis not present

## 2023-02-20 ENCOUNTER — Other Ambulatory Visit: Payer: Self-pay

## 2023-02-20 ENCOUNTER — Other Ambulatory Visit: Payer: Self-pay | Admitting: Unknown Physician Specialty

## 2023-02-20 DIAGNOSIS — H903 Sensorineural hearing loss, bilateral: Secondary | ICD-10-CM

## 2023-02-22 ENCOUNTER — Other Ambulatory Visit: Payer: Self-pay | Admitting: Unknown Physician Specialty

## 2023-02-22 DIAGNOSIS — H903 Sensorineural hearing loss, bilateral: Secondary | ICD-10-CM

## 2023-03-14 ENCOUNTER — Ambulatory Visit
Admission: RE | Admit: 2023-03-14 | Discharge: 2023-03-14 | Disposition: A | Discharge status: Home / Self Care | Payer: Medicare Other | Source: Ambulatory Visit | Attending: Unknown Physician Specialty | Admitting: Unknown Physician Specialty

## 2023-03-14 DIAGNOSIS — H903 Sensorineural hearing loss, bilateral: Secondary | ICD-10-CM

## 2023-03-14 MED ORDER — GADOPICLENOL 0.5 MMOL/ML IV SOLN
6.0000 mL | Freq: Once | INTRAVENOUS | Status: AC | PRN
  Administered 2023-03-14: 6 mL via INTRAVENOUS

## 2023-06-02 ENCOUNTER — Other Ambulatory Visit: Payer: Self-pay | Admitting: Internal Medicine

## 2023-06-02 DIAGNOSIS — Z1231 Encounter for screening mammogram for malignant neoplasm of breast: Secondary | ICD-10-CM

## 2023-06-29 ENCOUNTER — Other Ambulatory Visit: Payer: Self-pay | Admitting: Internal Medicine

## 2023-06-29 DIAGNOSIS — M810 Age-related osteoporosis without current pathological fracture: Secondary | ICD-10-CM

## 2023-07-19 ENCOUNTER — Ambulatory Visit
Admission: RE | Admit: 2023-07-19 | Discharge: 2023-07-19 | Disposition: A | Discharge status: Home / Self Care | Payer: Medicare Other | Source: Ambulatory Visit | Attending: Internal Medicine | Admitting: Internal Medicine

## 2023-07-19 DIAGNOSIS — Z1231 Encounter for screening mammogram for malignant neoplasm of breast: Secondary | ICD-10-CM | POA: Insufficient documentation

## 2023-07-25 ENCOUNTER — Ambulatory Visit
Admission: RE | Admit: 2023-07-25 | Discharge: 2023-07-25 | Disposition: A | Discharge status: Home / Self Care | Payer: Medicare Other | Source: Ambulatory Visit | Attending: Internal Medicine | Admitting: Internal Medicine

## 2023-07-25 DIAGNOSIS — M810 Age-related osteoporosis without current pathological fracture: Secondary | ICD-10-CM | POA: Diagnosis present

## 2023-08-23 ENCOUNTER — Encounter: Payer: Self-pay | Admitting: Student

## 2023-08-23 ENCOUNTER — Ambulatory Visit: Payer: Medicare Other | Admitting: Student

## 2023-08-23 VITALS — BP 126/84 | HR 56 | Temp 97.6°F | Ht 65.0 in | Wt 114.0 lb

## 2023-08-23 DIAGNOSIS — R040 Epistaxis: Secondary | ICD-10-CM

## 2023-08-23 MED ORDER — AYR SALINE NASAL NA GEL: 1.0000 | NASAL | 0 refills | Status: DC | PRN

## 2023-08-23 NOTE — Progress Notes (Signed)
[facility] clinic [facility].   Provider: Dr. Earnestine Mealing  Code Status: Full Code Goals of Care:     09/02/2019    8:41 AM  Advanced Directives  Does Patient Have a Medical Advance Directive? Yes  Type of Advance Directive Healthcare Power of Attorney  Copy of Healthcare Power of Attorney in Chart? No - copy requested     Chief Complaint  Patient presents with   Acute Visit    Was scheduled to remove skin tags on neck but since it has shrank. Wants to discuss One Nostril Bleeding.     HPI: Patient is a 77 y.o. female seen today for an acute visit for nosebleeds.  She has had a nosebleed 1 time per week for a long time.  She is curious about what she can do.  She read online that she could use nasal spray 3 times a day.  She also read about putting Vaseline on a Q-tip that could help provide moisture to the area.  She does endorse some nose picking.  She states that she read that this could lead to more bleeding she is stopped.  The initial encounter was to have skin tag removal, the skin tags have since disappeared on their own.  Past Medical History:  Diagnosis Date   Arthritis 2018   GERD (gastroesophageal reflux disease) 2005   Hyperlipidemia    Hypertension    Myocardial infarction (HCC) 02/01/2016   Broken Heart Syndrome   Osteoporosis 2018    Past Surgical History:  Procedure Laterality Date   CATARACT EXTRACTION Bilateral 2004   COLONOSCOPY WITH PROPOFOL N/A 09/02/2019   Procedure: COLONOSCOPY WITH PROPOFOL;  Surgeon: Toledo, Boykin Nearing, MD;  Location: [facility] ENDOSCOPY;  Service: Gastroenterology;  Laterality: N/A;   TONSILLECTOMY Bilateral 1954   TUBAL LIGATION  1980    Allergies  Allergen Reactions   Lactose Intolerance (Gi) Diarrhea    Outpatient Encounter Medications as of 08/23/2023  Medication Sig   ALPHA-D-GALACTOSIDASE PO Take 300 Units by mouth.   aluminum hydroxide-magnesium carbonate (GAVISCON) 95-358 mg/15 mL SUSP Take by mouth every evening.  nightly   aspirin EC 81 MG tablet Take 81 mg by mouth daily.   atorvastatin (LIPITOR) 20 MG tablet Take 20 mg by mouth daily.   Biotin 5 MG TABS Take 10,000 mg by mouth daily.   bismuth subsalicylate (PEPTO BISMOL) 262 MG/15ML suspension Take by mouth.   CALCIUM LACTATE PO Take by mouth.   cetirizine (ZYRTEC) 10 MG tablet Take 10 mg by mouth daily as needed for allergies.    Cholecalciferol (VITAMIN D3) 50 MCG (2000 UT) capsule Take 2,000 Units by mouth daily.   co-enzyme Q-10 50 MG capsule Take 200 mg by mouth daily. Ubidecarenone (Co-Enzyme Q-10, Ubiquinone) 200 tablet   denosumab (PROLIA) 60 MG/ML SOSY injection Inject 60 mg into the skin every 6 (six) months.   FERROUS FUM-IRON POLYSACCH PO Take by mouth. Iron fum, poly #1-vitC-L.casei 130 mg iron-25 mg-30 mg Cap   levETIRAcetam (KEPPRA XR) 500 MG 24 hr tablet Take 1 tablet (500 mg total) by mouth daily.   lisinopril (ZESTRIL) 5 MG tablet Take 5 mg by mouth daily.   metronidazole (NORITATE) 1 % cream Apply topically daily. Apply to face once daily   metronidazole (NORITATE) 1 % cream Apply 1 application topically See admin instructions. APP TO FACE ONCE DAILY   Multiple Vitamin (MULTIVITAMIN PO) Take by mouth. Multivitamin with B complex-vitamin C (FARBEE WITH C) tablet   NON FORMULARY Take  by mouth gelatin sponge, absorb/porcine (Gelatine Absorbable MISC)   NON FORMULARY Take by mouth C, E, zinc, copper 11/omega3s/lut (OCUVITE ADULT50+ ORAL)   NON FORMULARY Benefiber, Guar gum, Oral   NON FORMULARY Calm with Calcium   NON FORMULARY CocoaVia   pantoprazole (PROTONIX) 20 MG tablet Take 20 mg by mouth daily.   SIMETHICONE PO Take 1 tablet by mouth every 6 (six) hours as needed (flutulence). GAS-X ORAL)    No facility-administered encounter medications on file as of 08/23/2023.    Review of Systems:  Review of Systems  Health Maintenance  Topic Date Due   Medicare Annual Wellness (AWV)  Never done   Hepatitis C Screening  Never done    Zoster Vaccines- Shingrix (1 of 2) Never done   Pneumonia Vaccine 56+ Years old (1 of 1 - PCV) 04/09/2011   COVID-19 Vaccine (8 - 2023-24 season) 09/22/2023   DTaP/Tdap/Td (5 - Td or Tdap) 10/12/2031   INFLUENZA VACCINE  Completed   DEXA SCAN  Completed   HPV VACCINES  Aged Out   Colonoscopy  Discontinued    Physical Exam: Vitals:   08/23/23 1535  BP: 126/84  Pulse: (!) 56  Temp: 97.6 F (36.4 C)  SpO2: 94%  Weight: 114 lb (51.7 kg)  Height: 5\' 5"  (1.651 m)   Body mass index is 18.97 kg/m. Physical Exam Constitutional:      Appearance: Normal appearance.  HENT:     Nose: No rhinorrhea.     Comments: Normal nasal mucosa without bleeding or clots present. Skin:    Comments: Numerous seborrheic keratoses and moles.  Scattered cherry angiomas of the chest and shoulders.  Neurological:     Mental Status: She is alert.     Labs reviewed: Basic Metabolic Panel: No results for input(s): "NA", "K", "CL", "CO2", "GLUCOSE", "BUN", "CREATININE", "CALCIUM", "MG", "PHOS", "TSH" in the last 8760 hours. Liver Function Tests: No results for input(s): "AST", "ALT", "ALKPHOS", "BILITOT", "PROT", "ALBUMIN" in the last 8760 hours. No results for input(s): "LIPASE", "AMYLASE" in the last 8760 hours. No results for input(s): "AMMONIA" in the last 8760 hours. CBC: No results for input(s): "WBC", "NEUTROABS", "HGB", "HCT", "MCV", "PLT" in the last 8760 hours. Lipid Panel: No results for input(s): "CHOL", "HDL", "LDLCALC", "TRIG", "CHOLHDL", "LDLDIRECT" in the last 8760 hours. Lab Results  Component Value Date   HGBA1C 5.2 05/22/2019    Procedures since last visit: DG BONE DENSITY (DXA)  Result Date: 07/25/2023 EXAM: DUAL X-RAY ABSORPTIOMETRY (DXA) FOR BONE MINERAL DENSITY IMPRESSION: Your patient Nanako Stopher completed a BMD test on 07/25/2023 using the [employer] iDXA DXA System (software version: 14.10) manufactured by [employer]. The following summarizes the results of  our evaluation. Technologist:VLM PATIENT BIOGRAPHICAL: Name: Dalaysia, Harms Patient ID:  [id number] Birth Date: Jul 14, 1946 Height:     65.0 in. Gender:      Female Exam Date:  07/25/2023 Weight:     120.0 lbs. Indications: Caucasian, Advanced Age, Postmenopausal, History of Osteoporosis, Height Loss, High Risk Meds Fractures: Toes Treatments: Calcium, ZYRTEC, Vitamin D, keppra, Prolia DENSITOMETRY RESULTS: Site         Region     Measured Date Measured Age WHO Classification Young Adult T-score BMD         %Change vs. Previous Significant Change (*) DualFemur Total Left 07/25/2023 77.2 Osteopenia -1.6 0.801 g/cm2 2.6% Yes DualFemur Total Left 07/13/2021 75.2 Osteopenia -1.8 0.781 g/cm2 0.8% - DualFemur Total Left 6/18/2019 73.1 Osteopenia -1.9 0.775 g/cm2 - - DualFemur  Total Mean 07/25/2023 77.2 Osteopenia -1.4 0.825 g/cm2 1.2% - DualFemur Total Mean 07/13/2021 75.2 Osteopenia -1.5 0.815 g/cm2 2.1% - DualFemur Total Mean 06/18/2019 73.1 Osteopenia -1.7 0.798 g/cm2 - - Left Forearm Radius 33% 07/25/2023 77.2 Osteopenia -1.5 0.742 g/cm2 4.7% - Left Forearm Radius 33% 07/13/2021 75.2 Osteopenia -1.9 0.709 g/cm2 -0.6% - Left Forearm Radius 33% 06/18/2019 73.1 Osteopenia -1.9 0.713 g/cm2 - - ASSESSMENT: The BMD measured at Femur Total Left is 0.801 g/cm2 with a T-score of -1.6. This patient is considered osteopenic according to World Health Organization [facility]) criteria. Lumbar spine was not utilized due to advanced degenerative changes. Compared with prior study, there has been significant increase in the total hip. Patient is not a candidate for FRAX due to Prolia. The scan quality is good. World Science writer [facility]) criteria for post-menopausal, Caucasian Women: Normal:                   T-score at or above -1 SD Osteopenia/low bone mass: T-score between -1 and -2.5 SD Osteoporosis:             T-score at or below -2.5 SD RECOMMENDATIONS: 1. All patients should optimize calcium and vitamin D intake. 2. Consider  FDA-approved medical therapies in postmenopausal women and men aged 70 years and older, based on the following: a. A hip or vertebral(clinical or morphometric) fracture b. T-score < -2.5 at the femoral neck or spine after appropriate evaluation to exclude secondary causes c. Low bone mass (T-score between -1.0 and -2.5 at the femoral neck or spine) and a 10-year probability of a hip fracture > 3% or a 10-year probability of a major osteoporosis-related fracture > 20% based on the [place]-adapted WHO algorithm 3. Clinician judgment and/or patient preferences may indicate treatment for people with 10-year fracture probabilities above or below these levels FOLLOW-UP: People with diagnosed cases of osteoporosis or at high risk for fracture should have regular bone mineral density tests. For patients eligible for Medicare, routine testing is allowed once every 2 years. The testing frequency can be increased to one year for patients who have rapidly progressing disease, those who are receiving or discontinuing medical therapy to restore bone mass, or have additional risk factors. I have reviewed this report, and agree with the above findings. [facility] Electronically Signed   By: Frederico Hamman M.D.   On: 07/25/2023 16:04    Assessment/Plan Epistaxis - Plan: saline (AYR) GEL Discussed lifestyle changes that could impact patient's epistaxis.  3 times daily nasal spray.  Nightly nasal saline gel and Vaseline.  Follow-up as needed.  Discussed possible reasons to be seen with ENT.  Labs/tests ordered:  * No order type specified * Next appt:  Visit date not found

## 2023-08-23 NOTE — Patient Instructions (Signed)
Nice to meet you!  Please continue saline spray 3-4x a day.   Please use ayr nasal gel at night and apply petroleum jelly on top.  If you start to have more frequent nose bleeds please let us know!

## 2023-11-11 ENCOUNTER — Inpatient Hospital Stay: Payer: Medicare Other

## 2023-11-11 ENCOUNTER — Encounter: Payer: Self-pay | Admitting: Internal Medicine

## 2023-11-11 ENCOUNTER — Inpatient Hospital Stay
Admission: EM | Admit: 2023-11-11 | Discharge: 2023-11-14 | DRG: 640 | Disposition: A | Discharge status: Home / Self Care | Payer: Medicare Other | Attending: Internal Medicine | Admitting: Internal Medicine

## 2023-11-11 ENCOUNTER — Emergency Department: Payer: Medicare Other

## 2023-11-11 ENCOUNTER — Other Ambulatory Visit: Payer: Self-pay

## 2023-11-11 DIAGNOSIS — I252 Old myocardial infarction: Secondary | ICD-10-CM

## 2023-11-11 DIAGNOSIS — G9341 Metabolic encephalopathy: Secondary | ICD-10-CM | POA: Diagnosis present

## 2023-11-11 DIAGNOSIS — E876 Hypokalemia: Secondary | ICD-10-CM | POA: Diagnosis present

## 2023-11-11 DIAGNOSIS — G40909 Epilepsy, unspecified, not intractable, without status epilepticus: Secondary | ICD-10-CM | POA: Diagnosis present

## 2023-11-11 DIAGNOSIS — W19XXXA Unspecified fall, initial encounter: Secondary | ICD-10-CM | POA: Diagnosis present

## 2023-11-11 DIAGNOSIS — Z8673 Personal history of transient ischemic attack (TIA), and cerebral infarction without residual deficits: Secondary | ICD-10-CM | POA: Diagnosis not present

## 2023-11-11 DIAGNOSIS — I2489 Other forms of acute ischemic heart disease: Secondary | ICD-10-CM | POA: Diagnosis present

## 2023-11-11 DIAGNOSIS — M6282 Rhabdomyolysis: Secondary | ICD-10-CM | POA: Insufficient documentation

## 2023-11-11 DIAGNOSIS — F419 Anxiety disorder, unspecified: Secondary | ICD-10-CM | POA: Diagnosis present

## 2023-11-11 DIAGNOSIS — E739 Lactose intolerance, unspecified: Secondary | ICD-10-CM | POA: Diagnosis present

## 2023-11-11 DIAGNOSIS — R7989 Other specified abnormal findings of blood chemistry: Secondary | ICD-10-CM

## 2023-11-11 DIAGNOSIS — K219 Gastro-esophageal reflux disease without esophagitis: Secondary | ICD-10-CM | POA: Diagnosis present

## 2023-11-11 DIAGNOSIS — E871 Hypo-osmolality and hyponatremia: Secondary | ICD-10-CM | POA: Diagnosis not present

## 2023-11-11 DIAGNOSIS — R631 Polydipsia: Secondary | ICD-10-CM | POA: Diagnosis present

## 2023-11-11 DIAGNOSIS — M810 Age-related osteoporosis without current pathological fracture: Secondary | ICD-10-CM | POA: Diagnosis present

## 2023-11-11 DIAGNOSIS — R4182 Altered mental status, unspecified: Secondary | ICD-10-CM | POA: Insufficient documentation

## 2023-11-11 DIAGNOSIS — R569 Unspecified convulsions: Secondary | ICD-10-CM | POA: Diagnosis not present

## 2023-11-11 DIAGNOSIS — I10 Essential (primary) hypertension: Secondary | ICD-10-CM | POA: Diagnosis present

## 2023-11-11 DIAGNOSIS — Z79899 Other long term (current) drug therapy: Secondary | ICD-10-CM | POA: Diagnosis not present

## 2023-11-11 DIAGNOSIS — F17210 Nicotine dependence, cigarettes, uncomplicated: Secondary | ICD-10-CM | POA: Diagnosis present

## 2023-11-11 DIAGNOSIS — Z1152 Encounter for screening for COVID-19: Secondary | ICD-10-CM | POA: Diagnosis not present

## 2023-11-11 DIAGNOSIS — Z7982 Long term (current) use of aspirin: Secondary | ICD-10-CM | POA: Diagnosis not present

## 2023-11-11 DIAGNOSIS — R338 Other retention of urine: Secondary | ICD-10-CM | POA: Diagnosis not present

## 2023-11-11 DIAGNOSIS — R531 Weakness: Principal | ICD-10-CM

## 2023-11-11 DIAGNOSIS — I21A1 Myocardial infarction type 2: Secondary | ICD-10-CM | POA: Diagnosis not present

## 2023-11-11 DIAGNOSIS — Z960 Presence of urogenital implants: Secondary | ICD-10-CM | POA: Diagnosis present

## 2023-11-11 DIAGNOSIS — R339 Retention of urine, unspecified: Secondary | ICD-10-CM | POA: Diagnosis present

## 2023-11-11 DIAGNOSIS — E785 Hyperlipidemia, unspecified: Secondary | ICD-10-CM | POA: Diagnosis present

## 2023-11-11 DIAGNOSIS — K5900 Constipation, unspecified: Secondary | ICD-10-CM | POA: Diagnosis present

## 2023-11-11 DIAGNOSIS — I214 Non-ST elevation (NSTEMI) myocardial infarction: Secondary | ICD-10-CM | POA: Insufficient documentation

## 2023-11-11 LAB — DIFFERENTIAL
Abs Immature Granulocytes: 0.05 10*3/uL (ref 0.00–0.07)
Basophils Absolute: 0 10*3/uL (ref 0.0–0.1)
Basophils Relative: 0 %
Eosinophils Absolute: 0 10*3/uL (ref 0.0–0.5)
Eosinophils Relative: 0 %
Immature Granulocytes: 1 %
Lymphocytes Relative: 5 %
Lymphs Abs: 0.5 10*3/uL — ABNORMAL LOW (ref 0.7–4.0)
Monocytes Absolute: 0.7 10*3/uL (ref 0.1–1.0)
Monocytes Relative: 6 %
Neutro Abs: 9.2 10*3/uL — ABNORMAL HIGH (ref 1.7–7.7)
Neutrophils Relative %: 88 %

## 2023-11-11 LAB — MAGNESIUM
Magnesium: 2 mg/dL (ref 1.7–2.4)
Magnesium: 1.9 mg/dL (ref 1.7–2.4)

## 2023-11-11 LAB — COMPREHENSIVE METABOLIC PANEL
ALT: 43 U/L (ref 0–44)
ALT: 35 U/L (ref 0–44)
AST: 128 U/L — ABNORMAL HIGH (ref 15–41)
AST: 104 U/L — ABNORMAL HIGH (ref 15–41)
Albumin: 5.2 g/dL — ABNORMAL HIGH (ref 3.5–5.0)
Albumin: 3.9 g/dL (ref 3.5–5.0)
Alkaline Phosphatase: 48 U/L (ref 38–126)
Alkaline Phosphatase: 36 U/L — ABNORMAL LOW (ref 38–126)
Anion gap: 15 (ref 5–15)
Anion gap: 11 (ref 5–15)
BUN: 13 mg/dL (ref 8–23)
BUN: 12 mg/dL (ref 8–23)
CO2: 21 mmol/L — ABNORMAL LOW (ref 22–32)
CO2: 19 mmol/L — ABNORMAL LOW (ref 22–32)
Calcium: 8.7 mg/dL — ABNORMAL LOW (ref 8.9–10.3)
Calcium: 7.7 mg/dL — ABNORMAL LOW (ref 8.9–10.3)
Chloride: 80 mmol/L — ABNORMAL LOW (ref 98–111)
Chloride: 88 mmol/L — ABNORMAL LOW (ref 98–111)
Creatinine, Ser: 0.72 mg/dL (ref 0.44–1.00)
Creatinine, Ser: 0.56 mg/dL (ref 0.44–1.00)
GFR, Estimated: >60 mL/min (ref >60)
GFR, Estimated: >60 mL/min (ref >60)
Glucose, Bld: 138 mg/dL — ABNORMAL HIGH (ref 70–99)
Glucose, Bld: 99 mg/dL (ref 70–99)
Potassium: 4.1 mmol/L (ref 3.5–5.1)
Potassium: 3.4 mmol/L — ABNORMAL LOW (ref 3.5–5.1)
Sodium: 116 mmol/L — CL (ref 135–145)
Sodium: 118 mmol/L — CL (ref 135–145)
Total Bilirubin: 1.3 mg/dL — ABNORMAL HIGH (ref <1.2)
Total Bilirubin: 1.1 mg/dL (ref <1.2)
Total Protein: 8.3 g/dL — ABNORMAL HIGH (ref 6.5–8.1)
Total Protein: 6.2 g/dL — ABNORMAL LOW (ref 6.5–8.1)

## 2023-11-11 LAB — CBC
HCT: 42.5 % (ref 36.0–46.0)
Hemoglobin: 15.6 g/dL — ABNORMAL HIGH (ref 12.0–15.0)
MCH: 32.2 pg (ref 26.0–34.0)
MCHC: 36.7 g/dL — ABNORMAL HIGH (ref 30.0–36.0)
MCV: 87.8 fL (ref 80.0–100.0)
Platelets: 371 10*3/uL (ref 150–400)
RBC: 4.84 MIL/uL (ref 3.87–5.11)
RDW: 12.1 % (ref 11.5–15.5)
WBC: 10.4 10*3/uL (ref 4.0–10.5)
nRBC: 0 % (ref 0.0–0.2)

## 2023-11-11 LAB — URINALYSIS, W/ REFLEX TO CULTURE (INFECTION SUSPECTED)
Bilirubin Urine: NEGATIVE
Glucose, UA: NEGATIVE mg/dL
Ketones, ur: 5 mg/dL — AB
Leukocytes,Ua: NEGATIVE
Nitrite: NEGATIVE
Protein, ur: 100 mg/dL — AB
Specific Gravity, Urine: 1.006 (ref 1.005–1.030)
Squamous Epithelial / HPF: 0 /[HPF] (ref 0–5)
pH: 8 (ref 5.0–8.0)

## 2023-11-11 LAB — SODIUM, URINE, RANDOM: Sodium, Ur: 58 mmol/L

## 2023-11-11 LAB — TROPONIN I (HIGH SENSITIVITY)
Troponin I (High Sensitivity): 1245 ng/L (ref <18)
Troponin I (High Sensitivity): 1521 ng/L (ref <18)

## 2023-11-11 LAB — URIC ACID: Uric Acid, Serum: 3.7 mg/dL (ref 2.5–7.1)

## 2023-11-11 LAB — PROTIME-INR
INR: 1.1 (ref 0.8–1.2)
Prothrombin Time: 14.1 s (ref 11.4–15.2)

## 2023-11-11 LAB — SODIUM: Sodium: 116 mmol/L — CL (ref 135–145)

## 2023-11-11 LAB — CK: Total CK: 3785 U/L — ABNORMAL HIGH (ref 38–234)

## 2023-11-11 LAB — ETHANOL: Alcohol, Ethyl (B): <10 mg/dL (ref <10)

## 2023-11-11 LAB — APTT: aPTT: 29 s (ref 24–36)

## 2023-11-11 LAB — NA AND K (SODIUM & POTASSIUM), RAND UR
Potassium Urine: 26 mmol/L
Sodium, Ur: 57 mmol/L

## 2023-11-11 LAB — CBG MONITORING, ED: Glucose-Capillary: 119 mg/dL — ABNORMAL HIGH (ref 70–99)

## 2023-11-11 LAB — OSMOLALITY, URINE: Osmolality, Ur: 255 mosm/kg — ABNORMAL LOW (ref 300–900)

## 2023-11-11 LAB — OSMOLALITY: Osmolality: 248 mosm/kg — CL (ref 275–295)

## 2023-11-11 MED ORDER — ONDANSETRON HCL 4 MG/2ML IJ SOLN: 4.0000 mg | Freq: Four times a day (QID) | INTRAMUSCULAR | Status: DC | PRN

## 2023-11-11 MED ORDER — SODIUM CHLORIDE 0.9 % IV SOLN: INTRAVENOUS | Status: AC

## 2023-11-11 MED ORDER — ACETAMINOPHEN 325 MG PO TABS
650.0000 mg | ORAL_TABLET | Freq: Four times a day (QID) | ORAL | Status: DC | PRN
Start: 2023-11-11 — End: 2023-11-16

## 2023-11-11 MED ORDER — LORAZEPAM 2 MG/ML IJ SOLN: 1.0000 mg | INTRAMUSCULAR | Status: DC | PRN

## 2023-11-11 MED ORDER — POTASSIUM CHLORIDE CRYS ER 20 MEQ PO TBCR
20.0000 meq | EXTENDED_RELEASE_TABLET | Freq: Once | ORAL | Status: DC
  Filled 2023-11-11: qty 1

## 2023-11-11 MED ORDER — SODIUM CHLORIDE 0.9 % IV BOLUS
1000.0000 mL | Freq: Once | INTRAVENOUS | Status: AC
  Administered 2023-11-11: 1000 mL via INTRAVENOUS

## 2023-11-11 MED ORDER — ONDANSETRON HCL 4 MG PO TABS: 4.0000 mg | ORAL_TABLET | Freq: Four times a day (QID) | ORAL | Status: DC | PRN

## 2023-11-11 MED ORDER — ACETAMINOPHEN 650 MG RE SUPP: 650.0000 mg | Freq: Four times a day (QID) | RECTAL | Status: DC | PRN

## 2023-11-11 MED ORDER — SODIUM CHLORIDE 0.9 % IV BOLUS: 1000.0000 mL | Freq: Once | INTRAVENOUS | Status: DC

## 2023-11-11 MED ORDER — SENNOSIDES-DOCUSATE SODIUM 8.6-50 MG PO TABS
1.0000 | ORAL_TABLET | Freq: Every evening | ORAL | Status: DC | PRN
  Administered 2023-11-13: 1 via ORAL
  Filled 2023-11-11: qty 1

## 2023-11-11 MED ORDER — HEPARIN SODIUM (PORCINE) 5000 UNIT/ML IJ SOLN
5000.0000 [IU] | Freq: Three times a day (TID) | INTRAMUSCULAR | Status: DC
  Administered 2023-11-11 – 2023-11-14 (×8): 5000 [IU] via SUBCUTANEOUS
  Filled 2023-11-11 (×8): qty 1

## 2023-11-11 NOTE — Assessment & Plan Note (Signed)
Status post sodium chloride 1 L bolus per EDP Avoid aggressive IV fluid hydration in setting of hyponatremia Continue with sodium chloride infusion at 75 mL/h, 1 day ordered Recheck CK in the a.m.

## 2023-11-11 NOTE — ED Provider Notes (Signed)
[facility] Provider Note    Event Date/Time   First MD Initiated Contact with Patient 11/11/23 1352     (approximate)   History   Weakness   HPI  Anna Castaneda is a 77 y.o. female past medical history significant for prior CVA, hyperlipidemia, hypertension, presents to the emergency department with confusion and found laying on the ground.  Patient's last known well was approximately 24 hours ago.  Neighbors said that they had not seen her in multiple days so they tried to check on her.  Found her laying on the ground sleeping on the floor.  Stated that she does appear somewhat confused.  Friend at bedside states that in the past she has had problems with low sodium approximately 3 years ago and had a seizure likely secondary to her sodium and from having polydipsia and drinking a large amount of fluids.  States that she is constantly drinking water.  Patient without complaints and just states she does not remember anything other than sleeping on the ground.  Denies headache or change in vision.  Denies chest pain or shortness of breath.     Physical Exam   Triage Vital Signs: ED Triage Vitals  Encounter Vitals Group     BP 11/11/23 1243 102/62     Systolic BP Percentile --      Diastolic BP Percentile --      Pulse Rate 11/11/23 1243 76     Resp 11/11/23 1243 18     Temp 11/11/23 1243 98.5 F (36.9 C)     Temp Source 11/11/23 1243 Oral     SpO2 11/11/23 1243 100 %     Weight 11/11/23 1244 112 lb 7 oz (51 kg)     Height --      Head Circumference --      Peak Flow --      Pain Score 11/11/23 1244 0     Pain Loc --      Pain Education --      Exclude from Growth Chart --     Most recent vital signs: Vitals:   11/11/23 1243  BP: 102/62  Pulse: 76  Resp: 18  Temp: 98.5 F (36.9 C)  SpO2: 100%    Physical Exam Constitutional:      Appearance: She is well-developed.  HENT:     Head: Atraumatic.  Eyes:     Conjunctiva/sclera:  Conjunctivae normal.  Cardiovascular:     Rate and Rhythm: Regular rhythm.  Pulmonary:     Effort: No respiratory distress.  Abdominal:     General: There is no distension.  Musculoskeletal:        General: Normal range of motion.     Cervical back: Normal range of motion. No tenderness.  Skin:    General: Skin is warm.  Neurological:     Mental Status: She is alert. Mental status is at baseline.     GCS: GCS eye subscore is 4. GCS verbal subscore is 4. GCS motor subscore is 6.     Cranial Nerves: Cranial nerves 2-12 are intact.     Sensory: Sensation is intact.     Motor: Motor function is intact.     IMPRESSION / MDM / ASSESSMENT AND PLAN / ED COURSE  I reviewed the triage vital signs and the nursing notes.  Differential diagnosis including hyponatremia, electrolyte abnormality, dehydration, rhabdomyolysis, intracranial hemorrhage, CVA  EKG  I, Corena Herter, the attending physician, personally viewed and interpreted  this ECG.  Significant underlying artifact.  No obvious ST elevation or depression.  Normal intervals.  No significant findings concerning for acute ischemia or dysrhythmia  RADIOLOGY I independently reviewed imaging, my interpretation of imaging: CT scan of the head and cervical spine no acute fracture or dislocation.  LABS (all labs ordered are listed, but only abnormal results are displayed) Labs interpreted as -    Labs Reviewed  CBC - Abnormal; Notable for the following components:      Result Value   Hemoglobin 15.6 (*)    MCHC 36.7 (*)    All other components within normal limits  DIFFERENTIAL - Abnormal; Notable for the following components:   Neutro Abs 9.2 (*)    Lymphs Abs 0.5 (*)    All other components within normal limits  COMPREHENSIVE METABOLIC PANEL - Abnormal; Notable for the following components:   Sodium 116 (*)    Chloride 80 (*)    CO2 21 (*)    Glucose, Bld 138 (*)    Calcium 8.7 (*)    Total Protein 8.3 (*)    Albumin 5.2  (*)    AST 128 (*)    Total Bilirubin 1.3 (*)    All other components within normal limits  CK - Abnormal; Notable for the following components:   Total CK 3,785 (*)    All other components within normal limits  CBG MONITORING, ED - Abnormal; Notable for the following components:   Glucose-Capillary 119 (*)    All other components within normal limits  TROPONIN I (HIGH SENSITIVITY) - Abnormal; Notable for the following components:   Troponin I (High Sensitivity) 1,245 (*)    All other components within normal limits  PROTIME-INR  APTT  ETHANOL  MAGNESIUM  NA AND K (SODIUM & POTASSIUM), RAND UR  URINALYSIS, W/ REFLEX TO CULTURE (INFECTION SUSPECTED)     MDM  Sodium found to be critically low at 116.  Patient appears euvolemic and does not appear volume overloaded.  CK significantly elevated in the 3000's concerning for rhabdomyolysis.  Also has an elevated troponin of 1200.  Consulted and discussed the patient's case with cardiology on-call Dr. Duke Salvia who recommended not starting heparin at this time we will continue to follow.  Discussed the patient's case with nephrology on-call Dr. Micki Riley, recommended saline at 75 an hour and holding on hypertonic at this time.  Consulted hospitalist for admission for hyponatremia, rhabdomyolysis and elevated troponin     PROCEDURES:  Critical Care performed: yes  .Critical Care  Performed by: Corena Herter, MD Authorized by: Corena Herter, MD   Critical care provider statement:    Critical care time (minutes):  50   Critical care time was exclusive of:  Separately billable procedures and treating other patients   Critical care was necessary to treat or prevent imminent or life-threatening deterioration of the following conditions:  Metabolic crisis   Critical care was time spent personally by me on the following activities:  Development of treatment plan with patient or surrogate, discussions with consultants, evaluation of  patient's response to treatment, examination of patient, ordering and review of laboratory studies, ordering and review of radiographic studies, ordering and performing treatments and interventions, pulse oximetry, re-evaluation of patient's condition and review of old charts   Care discussed with: admitting provider     Patient's presentation is most consistent with acute presentation with potential threat to life or bodily function.   MEDICATIONS ORDERED IN ED: Medications  acetaminophen (TYLENOL) tablet 650 mg (  has no administration in time range)    Or  acetaminophen (TYLENOL) suppository 650 mg (has no administration in time range)  ondansetron (ZOFRAN) tablet 4 mg (has no administration in time range)    Or  ondansetron (ZOFRAN) injection 4 mg (has no administration in time range)  heparin injection 5,000 Units (has no administration in time range)  senna-docusate (Senokot-S) tablet 1 tablet (has no administration in time range)  sodium chloride 0.9 % bolus 1,000 mL (1,000 mLs Intravenous New Bag/Given 11/11/23 1508)    FINAL CLINICAL IMPRESSION(S) / ED DIAGNOSES   Final diagnoses:  Weakness  Hyponatremia  Elevated troponin  Non-traumatic rhabdomyolysis     Rx / DC Orders   ED Discharge Orders     None        Note:  This document was prepared using Dragon voice recognition software and may include unintentional dictation errors.   Corena Herter, MD 11/11/23 1606

## 2023-11-11 NOTE — ED Triage Notes (Signed)
Found by neighbor on her apartment floor today, was unable to recall to neighbor how she got.  LSW yesterday afternoon, neighbor received a text message from her at 3pm.   No blood thinner seen on medication list.

## 2023-11-11 NOTE — Assessment & Plan Note (Addendum)
Lorazepam 1 mg IV as needed for anxiety, seizure, 3 doses ordered with instructions to administer as appropriate and then let provider know Per patient's friend, patient had a seizure many years ago when she had sodium level to low.

## 2023-11-11 NOTE — Assessment & Plan Note (Signed)
Potassium chloride 20 mEq PO one time dose ordered on admission

## 2023-11-11 NOTE — Assessment & Plan Note (Signed)
Fall precautions.  

## 2023-11-11 NOTE — ED Notes (Signed)
Critical sodium of 116 called by lab. EDP Mumma informed.

## 2023-11-11 NOTE — ED Notes (Signed)
First nurse note: From [facility] independent living weakness via [facility] Pt unsteady when EMS got her to stretcher 126/80, CBG 160, 97% RA, HR 77

## 2023-11-11 NOTE — H&P (Signed)
History and Physical   [name]:[id number] DOB: 10/01/1946 DOA: 11/11/2023  PCP: Barbette Reichmann, MD  Outpatient Specialists: Dr. Gillermo Murdoch clinic cardiology Patient coming from: [facility] independent living facility via EMS  I have personally briefly reviewed patient's old medical records in [facility] EMR.  Chief Concern: Found down, suspect syncope  HPI: [name] is a 89 female with history of hypertension, GERD, osteoarthritis, history of hyponatremia, who presents emergency department for chief concerns of being found down on her apartment floor by her neighbor.  Patient was transported from [facility] independent living facility via EMS.  Vitals in the ED showed temperature of 98.5, respiration rate of 18, heart rate of 78, blood pressure 102/62, SpO2 of 100% on room air.  Serum sodium is 116, potassium 4.1, chloride 80, bicarb 21, BUN of 13, serum creatinine of 0.72, EGFR greater than 60, nonfasting blood glucose 138, WBC 10.4, hemoglobin 15.6, platelets of 371.  CK level was elevated at 3785.  High sensitive troponin was 1245.  ED treatment: Sodium chloride 1 L bolus. --------------------------------- At bedside, patient was able to tell me her name.  She was not able to tell me her age, her date of birth, current calendar year or current location.  Patient falls back asleep, is arousable.  Patient's friend and neighbor Myriam Jacobson states that she would know these things at baseline.  Social history: Patient lives at [facility] [place]  ROS: Able to complete due to altered mentation  ED Course: Discussed with emergency medicine provider, patient requiring hospitalization for chief concerns of hyponatremia.  Assessment/Plan  Principal Problem:   Hyponatremia Active Problems:   Essential hypertension   Dyslipidemia   Hypokalemia   Seizures (HCC)   Fall   GERD (gastroesophageal reflux disease)   Rhabdomyolysis   NSTEMI (non-ST elevated myocardial  infarction) (HCC)   Acute urinary retention   Altered mental status   Assessment and Plan:  * Hyponatremia Etiology workup in progress, differentials include SIADH, psychogenic polydipsia Check serum osmolality, urine osmolality, urine sodium level Nephrology has been consulted by EDP and per EDP nephrology service recommends sodium chloride infusion at 75 mL/h and check serum sodium level every 4 hours Admit to telemetry cardiac, inpatient  Altered mental status Check MRI, portable chest x-ray  Acute urinary retention Insert urethral catheter with catheter care order placed on admission Strict I's and O's Nursing aware  NSTEMI (non-ST elevated myocardial infarction) (HCC) EDP discussed patient elevated high sensitive troponin with cardiology and per EDP, cardiology does not recommend heparin GTT at this time given multiple acute issues including rhabdomyolysis and hyponatremia Recheck second high sensitive troponin to monitor  Rhabdomyolysis Status post sodium chloride 1 L bolus per EDP Avoid aggressive IV fluid hydration in setting of hyponatremia Continue with sodium chloride infusion at 75 mL/h, 1 day ordered Recheck CK in the a.m.  Fall Fall precautions  Seizures (HCC) Lorazepam 1 mg IV as needed for anxiety, seizure, 3 doses ordered with instructions to administer as appropriate and then let provider know Per patient's friend, patient had a seizure many years ago when she had sodium level to low.  Hypokalemia Potassium chloride 20 mEq PO one time dose ordered on admission  Chart reviewed.   DVT prophylaxis: Heparin 5000 units subcutaneous every 8 hours Code Status: full code Diet: NPO due to altered mentation Family Communication: updated neighbor, Shelby Dubin at [phone] Disposition Plan: Pending clinical course Consults called: Nephrology service Admission status: Telemetry cardiac, inpatient  Past Medical History:  Diagnosis Date   Arthritis 2018    GERD (gastroesophageal reflux disease) 2005   Hyperlipidemia    Hypertension    Myocardial infarction (HCC) 02/01/2016   Broken Heart Syndrome   Osteoporosis 2018   Past Surgical History:  Procedure Laterality Date   CATARACT EXTRACTION Bilateral 2004   COLONOSCOPY WITH PROPOFOL N/A 09/02/2019   Procedure: COLONOSCOPY WITH PROPOFOL;  Surgeon: Toledo, Boykin Nearing, MD;  Location: [facility] ENDOSCOPY;  Service: Gastroenterology;  Laterality: N/A;   TONSILLECTOMY Bilateral 1954   TUBAL LIGATION  1980   Social History:  reports that she quit smoking about 34 years ago. Her smoking use included cigarettes. She started smoking about 49 years ago. She has a 15 pack-year smoking history. She has never used smokeless tobacco. She reports current alcohol use of about 7.0 standard drinks of alcohol per week. She reports that she does not use drugs.  Allergies  Allergen Reactions   Lactose Diarrhea   Lactose Intolerance (Gi) Diarrhea   Family History  Problem Relation Age of Onset   Breast cancer Neg Hx    Family history: Family history reviewed and not pertinent.  Prior to Admission medications   Medication Sig Start Date End Date Taking? Authorizing Provider  ALPHA-D-GALACTOSIDASE PO Take 300 Units by mouth.    [provider]  aluminum hydroxide-magnesium carbonate (GAVISCON) 95-358 mg/15 mL SUSP Take by mouth every evening. nightly    [provider]  aspirin EC 81 MG tablet Take 81 mg by mouth daily.    [provider]  atorvastatin (LIPITOR) 20 MG tablet Take 20 mg by mouth daily.    [provider]  Biotin 5 MG TABS Take 10,000 mg by mouth daily.    [provider]  bismuth subsalicylate (PEPTO BISMOL) 262 MG/15ML suspension Take by mouth.    [provider]  CALCIUM LACTATE PO Take by mouth.    [provider]  cetirizine (ZYRTEC) 10 MG tablet Take 10 mg by mouth daily as needed for allergies.     [provider]   Cholecalciferol (VITAMIN D3) 50 MCG (2000 UT) capsule Take 2,000 Units by mouth daily.    [provider]  co-enzyme Q-10 50 MG capsule Take 200 mg by mouth daily. Ubidecarenone (Co-Enzyme Q-10, Ubiquinone) 200 tablet    [provider]  denosumab (PROLIA) 60 MG/ML SOSY injection Inject 60 mg into the skin every 6 (six) months.    [provider]  FERROUS FUM-IRON POLYSACCH PO Take by mouth. Iron fum, poly #1-vitC-L.casei 130 mg iron-25 mg-30 mg Cap    [provider]  levETIRAcetam (KEPPRA XR) 500 MG 24 hr tablet Take 1 tablet (500 mg total) by mouth daily. 05/25/19   Layne Benton, NP  lisinopril (ZESTRIL) 5 MG tablet Take 5 mg by mouth daily.    [provider]  metronidazole (NORITATE) 1 % cream Apply topically daily. Apply to face once daily    [provider]  metronidazole (NORITATE) 1 % cream Apply 1 application topically See admin instructions. APP TO FACE ONCE DAILY 08/12/16   [provider]  Multiple Vitamin (MULTIVITAMIN PO) Take by mouth. Multivitamin with B complex-vitamin C (FARBEE WITH C) tablet    [provider]  NON FORMULARY Take by mouth gelatin sponge, absorb/porcine (Gelatine Absorbable MISC)    [provider]  NON FORMULARY Take by mouth C, E, zinc, copper 11/omega3s/lut (OCUVITE ADULT50+ ORAL)    [provider]  NON FORMULARY Benefiber, Guar gum, Oral  [provider]  NON FORMULARY Calm with Calcium    [provider]  NON FORMULARY CocoaVia    [provider]  pantoprazole (PROTONIX) 20 MG tablet Take 20 mg by mouth daily.    [provider]  saline (AYR) GEL Place 1 Application into both nostrils every 4 (four) hours as needed. 08/23/23   Earnestine Mealing, MD  SIMETHICONE PO Take 1 tablet by mouth every 6 (six) hours as needed (flutulence). GAS-X ORAL)     [provider]   Physical Exam: Vitals:   11/11/23 1815 11/11/23 1830  11/11/23 1845 11/11/23 1930  BP:  113/66  109/62  Pulse: 78 73 70 76  Resp: 16 11 17 16   Temp:      TempSrc:      SpO2: 97% 97% 96% 97%  Weight:       Constitutional: appears frail, confused Eyes: PERRL, lids and conjunctivae normal ENMT: Mucous membranes are moist. Posterior pharynx clear of any exudate or lesions. Age-appropriate dentition. Hearing appropriate Neck: normal, supple, no masses, no thyromegaly Respiratory: clear to auscultation bilaterally, no wheezing, no crackles. Normal respiratory effort. No accessory muscle use.  Cardiovascular: Regular rate and rhythm, no murmurs / rubs / gallops. No extremity edema. 2+ pedal pulses. No carotid bruits.  Abdomen: no tenderness, no masses palpated, no hepatosplenomegaly. Bowel sounds positive.  Musculoskeletal: no clubbing / cyanosis. No joint deformity upper and lower extremities. Good ROM, no contractures, no atrophy. Normal muscle tone.  Skin: no rashes, lesions, ulcers. No induration Neurologic: Sensation intact. Strength 5/5 in all 4.  Psychiatric: Lacks judgment and insight. Alert and oriented x self. Normal mood.   EKG: independently reviewed, showing sinus rhythm with rate of 74, QTc 441, right bundle branch block  Chest x-ray on Admission: Ordered and pending completion  CT Cervical Spine Wo Contrast Result Date: 11/11/2023 CLINICAL DATA:  Patient was found on the floor by her neighbor today. Last seen well yesterday. EXAM: CT CERVICAL SPINE WITHOUT CONTRAST TECHNIQUE: Multidetector CT imaging of the cervical spine was performed without intravenous contrast. Multiplanar CT image reconstructions were also generated. RADIATION DOSE REDUCTION: This exam was performed according to the departmental dose-optimization program which includes automated exposure control, adjustment of the mA and/or kV according to patient size and/or use of iterative reconstruction technique. COMPARISON:  MRI of the cervical spine 10/31/2021 FINDINGS:  Alignment: Grade 1 anterolisthesis at C4-5 and C5-6 is stable. No other significant listhesis is present. Cervical lordosis is within normal limits. Exaggerated thoracic kyphosis is present. Skull base and vertebrae: The craniocervical junction is normal. Vertebral body heights are normal. No acute fractures are present. Soft tissues and spinal canal: No prevertebral fluid or swelling. No visible canal hematoma. Disc levels: Asymmetric left foraminal narrowing is present at C3-4, C4-5, C5-6 and C6-7 due to uncovertebral and facet hypertrophy. Upper chest: The lung apices are clear. The thoracic inlet is within normal limits. Gaseous distension of the upper soft Gus is noted. IMPRESSION: 1. No acute fracture or traumatic subluxation. 2. Stable grade 1 anterolisthesis at C4-5 and C5-6. 3. Asymmetric left foraminal narrowing at C3-4, C4-5, C5-6 and C6-7 due to uncovertebral and facet hypertrophy. Electronically Signed   By: Marin Roberts M.D.   On: 11/11/2023 14:20   CT HEAD WO CONTRAST Result Date: 11/11/2023 CLINICAL DATA:  Neuro deficit, acute, stroke suspected. Patient was found in her apartment by a neighbor today. Last seen well yesterday. EXAM: CT HEAD WITHOUT CONTRAST TECHNIQUE: Contiguous axial images were obtained from  the base of the skull through the vertex without intravenous contrast. RADIATION DOSE REDUCTION: This exam was performed according to the departmental dose-optimization program which includes automated exposure control, adjustment of the mA and/or kV according to patient size and/or use of iterative reconstruction technique. COMPARISON:  MR head without and with contrast 03/14/2023 FINDINGS: Brain: Mild periventricular and scattered subcortical white matter hypoattenuation is stable from prior exams. No acute infarct, hemorrhage, or mass lesion is present. Deep brain nuclei are within normal limits. The ventricles are of normal size. No significant extraaxial fluid collection is  present. The brainstem and cerebellum are within normal limits. Midline structures are within normal limits. Vascular: No hyperdense vessel or unexpected calcification. Skull: Scalp soft tissue swelling is present to the right of midline near the vertex. No underlying fracture or foreign body is present. No other significant extracranial soft tissue injury is present. Sinuses/Orbits: The paranasal sinuses and mastoid air cells are clear. Bilateral lens replacements are noted. Globes and orbits are otherwise unremarkable. IMPRESSION: 1. No acute intracranial abnormality or significant interval change. 2. Stable mild periventricular and scattered subcortical white matter hypoattenuation. This likely reflects the sequela of chronic microvascular ischemia. 3. Scalp soft tissue swelling to the right of midline near the vertex without underlying fracture or foreign body. Electronically Signed   By: Marin Roberts M.D.   On: 11/11/2023 14:17   Labs on Admission: I have personally reviewed following labs  CBC: Recent Labs  Lab 11/11/23 1250  WBC 10.4  NEUTROABS 9.2*  HGB 15.6*  HCT 42.5  MCV 87.8  PLT 371   Basic Metabolic Panel: Recent Labs  Lab 11/11/23 1250 11/11/23 1615 11/11/23 1917  NA 116* 116* 118*  K 4.1  --  3.4*  CL 80*  --  88*  CO2 21*  --  19*  GLUCOSE 138*  --  99  BUN 13  --  12  CREATININE 0.72  --  0.56  CALCIUM 8.7*  --  7.7*  MG 2.0  --  1.9   GFR: Estimated Creatinine Clearance: 47.4 mL/min (by C-G formula based on SCr of 0.56 mg/dL).  Liver Function Tests: Recent Labs  Lab 11/11/23 1250 11/11/23 1917  AST 128* 104*  ALT 43 35  ALKPHOS 48 36*  BILITOT 1.3* 1.1  PROT 8.3* 6.2*  ALBUMIN 5.2* 3.9   Coagulation Profile: Recent Labs  Lab 11/11/23 1250  INR 1.1   Cardiac Enzymes: Recent Labs  Lab 11/11/23 1250  CKTOTAL 3,785*   CBG: Recent Labs  Lab 11/11/23 1510  GLUCAP 119*   This document was prepared using Dragon Voice Recognition  software and may include unintentional dictation errors.  Dr. Sedalia Muta [facility]  If 7PM-7AM, please contact overnight-coverage provider If 7AM-7PM, please contact day attending provider [email or web address]  11/11/2023, 8:40 PM

## 2023-11-11 NOTE — Assessment & Plan Note (Addendum)
Check MRI, portable chest x-ray

## 2023-11-11 NOTE — Progress Notes (Signed)
[facility] CONSULT NOTE    Date: 11/11/2023                  Patient Name:  Anna Castaneda  MRN: [id number]  DOB: Apr 26, 1946  Age / Sex: 77 y.o., female         PCP: Barbette Reichmann, MD                 Service Requesting Consult: Medicine                  Reason for Consult: Hyponatremia             History of Present Illness: Patient is a 77 y.o. female with a PMHx of hypertension, anxiety disorder, hyperlipidemia, history of CVA, previous history of hyponatremia leading to seizure now admitted to the hospital after a fall and some confusion as per the neighbors.  She was found to have a sodium of 116.  When I spoke to her she is awake and alert oriented x 3.  Patient says she has been drinking a lot of water for the last few days.  She denies any history of chest pain, shortness of breath or orthopnea.  Patient denies any history of fever chills.  She does not drink alcohol or smoke cigarettes.  She denies any use of diuretics.  Medications: Outpatient medications: (Not in a hospital admission)   Discontinued Meds:   Medications Discontinued During This Encounter  Medication Reason   sodium chloride 0.9 % bolus 1,000 mL     Current medications: Current Facility-Administered Medications  Medication Dose Route Frequency Provider Last Rate Last Admin   0.9 %  sodium chloride infusion   Intravenous Continuous Cox, Amy N, DO 75 mL/hr at 11/11/23 1649 New Bag at 11/11/23 1649   acetaminophen (TYLENOL) tablet 650 mg  650 mg Oral Q6H PRN Cox, Amy N, DO       Or   acetaminophen (TYLENOL) suppository 650 mg  650 mg Rectal Q6H PRN Cox, Amy N, DO       heparin injection 5,000 Units  5,000 Units Subcutaneous Q8H Cox, Amy N, DO       LORazepam (ATIVAN) injection 1 mg  1 mg Intravenous PRN Cox, Amy N, DO       ondansetron (ZOFRAN) tablet 4 mg  4 mg Oral Q6H PRN Cox, Amy N, DO       Or   ondansetron (ZOFRAN) injection 4 mg  4 mg Intravenous Q6H PRN Cox, Amy N,  DO       senna-docusate (Senokot-S) tablet 1 tablet  1 tablet Oral QHS PRN Cox, Amy N, DO       Current Outpatient Medications  Medication Sig Dispense Refill   ALPHA-D-GALACTOSIDASE PO Take 300 Units by mouth.     aluminum hydroxide-magnesium carbonate (GAVISCON) 95-358 mg/15 mL SUSP Take by mouth every evening. nightly     aspirin EC 81 MG tablet Take 81 mg by mouth daily.     atorvastatin (LIPITOR) 20 MG tablet Take 20 mg by mouth daily.     Biotin 5 MG TABS Take 10,000 mg by mouth daily.     bismuth subsalicylate (PEPTO BISMOL) 262 MG/15ML suspension Take by mouth.     CALCIUM LACTATE PO Take by mouth.     cetirizine (ZYRTEC) 10 MG tablet Take 10 mg by mouth daily as needed for allergies.      Cholecalciferol (VITAMIN D3) 50 MCG (2000 UT) capsule Take 2,000 Units by  mouth daily.     co-enzyme Q-10 50 MG capsule Take 200 mg by mouth daily. Ubidecarenone (Co-Enzyme Q-10, Ubiquinone) 200 tablet     denosumab (PROLIA) 60 MG/ML SOSY injection Inject 60 mg into the skin every 6 (six) months.     FERROUS FUM-IRON POLYSACCH PO Take by mouth. Iron fum, poly #1-vitC-L.casei 130 mg iron-25 mg-30 mg Cap     levETIRAcetam (KEPPRA XR) 500 MG 24 hr tablet Take 1 tablet (500 mg total) by mouth daily. 30 tablet 2   lisinopril (ZESTRIL) 5 MG tablet Take 5 mg by mouth daily.     metronidazole (NORITATE) 1 % cream Apply topically daily. Apply to face once daily     metronidazole (NORITATE) 1 % cream Apply 1 application topically See admin instructions. APP TO FACE ONCE DAILY     Multiple Vitamin (MULTIVITAMIN PO) Take by mouth. Multivitamin with B complex-vitamin C (FARBEE WITH C) tablet     NON FORMULARY Take by mouth gelatin sponge, absorb/porcine (Gelatine Absorbable MISC)     NON FORMULARY Take by mouth C, E, zinc, copper 11/omega3s/lut (OCUVITE ADULT50+ ORAL)     NON FORMULARY Benefiber, Guar gum, Oral     NON FORMULARY Calm with Calcium     NON FORMULARY CocoaVia     pantoprazole (PROTONIX) 20 MG  tablet Take 20 mg by mouth daily.     saline (AYR) GEL Place 1 Application into both nostrils every 4 (four) hours as needed. 14.1 g 0   SIMETHICONE PO Take 1 tablet by mouth every 6 (six) hours as needed (flutulence). GAS-X ORAL)         Allergies: Allergies  Allergen Reactions   Lactose Intolerance (Gi) Diarrhea      Past Medical History: Past Medical History:  Diagnosis Date   Arthritis 2018   GERD (gastroesophageal reflux disease) 2005   Hyperlipidemia    Hypertension    Myocardial infarction (HCC) 02/01/2016   Broken Heart Syndrome   Osteoporosis 2018     Past Surgical History: Past Surgical History:  Procedure Laterality Date   CATARACT EXTRACTION Bilateral 2004   COLONOSCOPY WITH PROPOFOL N/A 09/02/2019   Procedure: COLONOSCOPY WITH PROPOFOL;  Surgeon: Toledo, Boykin Nearing, MD;  Location: [facility] ENDOSCOPY;  Service: Gastroenterology;  Laterality: N/A;   TONSILLECTOMY Bilateral 1954   TUBAL LIGATION  1980     Family History: Family History  Problem Relation Age of Onset   Breast cancer Neg Hx      Social History: Social History   Socioeconomic History   Marital status: Widowed    Spouse name: Not on file   Number of children: Not on file   Years of education: Not on file   Highest education level: Bachelor's degree (e.g., BA, AB, BS)  Occupational History   Not on file  Tobacco Use   Smoking status: Former    Current packs/day: 0.00    Average packs/day: 1 pack/day for 15.0 years (15.0 ttl pk-yrs)    Types: Cigarettes    Start date: 04/23/1974    Quit date: 04/23/1989    Years since quitting: 34.5   Smokeless tobacco: Never   Tobacco comments:    smoker: 07/03/1974 - 04/23/1989  Vaping Use   Vaping status: Never Used  Substance and Sexual Activity   Alcohol use: Yes    Alcohol/week: 7.0 standard drinks of alcohol    Types: 7 Glasses of wine per week   Drug use: Never   Sexual activity: Not Currently  Other Topics  Concern   Not on file  Social  History Narrative   Not on file   Social Drivers of Health   Financial Resource Strain: Low Risk  (09/05/2023)   Received from [facility]   Overall Financial Resource Strain (CARDIA)    Difficulty of Paying Living Expenses: Not hard at all  Food Insecurity: No Food Insecurity (09/05/2023)   [facility]   Hunger Vital Sign    Worried About Running Out of Food in the Last Year: Never true    Ran Out of Food in the Last Year: Never true  Transportation Needs: No Transportation Needs (09/05/2023)   Received from [facility] - Transportation    In the past 12 months, has lack of transportation kept you from medical appointments or from getting medications?: No    Lack of Transportation (Non-Medical): No  Physical Activity: Sufficiently Active (08/22/2023)   Exercise Vital Sign    Days of Exercise per Week: 7 days    Minutes of Exercise per Session: 60 min  Stress: No Stress Concern Present (08/22/2023)   [employer] of Occupational Health - Occupational Stress Questionnaire    Feeling of Stress : Not at all  Social Connections: Moderately Integrated (08/22/2023)   Social Connection and Isolation Panel [NHANES]    Frequency of Communication with Friends and Family: More than three times a week    Frequency of Social Gatherings with Friends and Family: More than three times a week    Attends Religious Services: 1 to 4 times per year    Active Member of [employer] or Organizations: Yes    Attends Banker Meetings: More than 4 times per year    Marital Status: Widowed  Intimate Partner Violence: Not on file     Review of Systems: As per HPI  Vital Signs: Blood pressure 102/62, pulse 76, temperature 98.4 F (36.9 C), temperature source Axillary, resp. rate 18, weight 51 kg, SpO2 100%.  Weight trends: Filed Weights   11/11/23 1244  Weight: 51 kg    Physical Exam: Physical Exam: General:   No acute distress  Head:  Normocephalic, atraumatic. Moist oral mucosal membranes  Eyes:  Anicteric  Neck:  Supple  Lungs:   Clear to auscultation, normal effort  Heart:  S1S2 no rubs  Abdomen:   Soft, nontender, bowel sounds present  Extremities:  peripheral edema.  Neurologic:  Awake, alert, following commands  Skin:  No lesions  Access:     Lab results:  Basic Metabolic Panel: Recent Labs  Lab 11/11/23 1250 11/11/23 1615  NA 116* 116*  K 4.1  --   CL 80*  --   CO2 21*  --   GLUCOSE 138*  --   BUN 13  --   CREATININE 0.72  --   CALCIUM 8.7*  --   MG 2.0  --     Creatinine, Ser  Date/Time Value Ref Range Status  11/11/2023 12:50 PM 0.72 0.44 - 1.00 mg/dL Final  96/29/5284 13:24 AM 0.54 0.44 - 1.00 mg/dL Final  40/08/2724 36:64 AM 0.63 0.44 - 1.00 mg/dL Final  40/34/7425 95:63 PM 0.63 0.44 - 1.00 mg/dL Final  87/56/4332 95:18 AM 0.72 0.44 - 1.00 mg/dL Final  84/16/6063 01:60 PM 0.58 0.44 - 1.00 mg/dL Final  10/93/2355 73:22 AM 0.62 0.44 - 1.00 mg/dL Final  02/54/2706 23:76 AM 0.57 0.44 - 1.00 mg/dL Final  28/31/5176 16:07 AM 0.66 0.44 - 1.00  mg/dL Final  09/10/2535 64:40 AM 0.79 0.44 - 1.00 mg/dL Final  34/74/2595 63:87 AM 0.56 0.44 - 1.00 mg/dL Final  56/43/3295 18:84 AM 0.56 0.44 - 1.00 mg/dL Final  16/60/6301 60:10 PM 0.58 0.44 - 1.00 mg/dL Final  93/23/5573 22:02 PM 0.56 0.44 - 1.00 mg/dL Final  54/27/0623 76:28 PM 0.50 0.44 - 1.00 mg/dL Final  31/51/7616 07:37 PM 0.62 0.44 - 1.00 mg/dL Final    CBC: Recent Labs  Lab 11/11/23 1250  WBC 10.4  NEUTROABS 9.2*  HGB 15.6*  HCT 42.5  MCV 87.8  PLT 371    Microbiology: Results for orders placed or performed during the hospital encounter of 08/29/19  SARS CORONAVIRUS 2 (TAT 6-24 HRS) Nasopharyngeal Nasopharyngeal Swab     Status: None   Collection Time: 08/29/19 10:44 AM   Specimen: Nasopharyngeal Swab  Result Value Ref Range Status   SARS Coronavirus 2 NEGATIVE NEGATIVE Final    Comment:  (NOTE) SARS-CoV-2 target nucleic acids are NOT DETECTED. The SARS-CoV-2 RNA is generally detectable in upper and lower respiratory specimens during the acute phase of infection. Negative results do not preclude SARS-CoV-2 infection, do not rule out co-infections with other pathogens, and should not be used as the sole basis for treatment or other patient management decisions. Negative results must be combined with clinical observations, patient history, and epidemiological information. The expected result is Negative. Fact Sheet for Patients: HairSlick.no Fact Sheet for Healthcare Providers: quierodirigir.com This test is not yet approved or cleared by the [place] FDA and  has been authorized for detection and/or diagnosis of SARS-CoV-2 by FDA under an Emergency Use Authorization (EUA). This EUA will remain  in effect (meaning this test can be used) for the duration of the COVID-19 declaration under Section 56 4(b)(1) of the Act, 21 U.S.C. section 360bbb-3(b)(1), unless the authorization is terminated or revoked sooner. Performed at [facility], [address] [place], [place] [address]     Urinalysis: Recent Labs    11/11/23 1615  COLORURINE STRAW*  LABSPEC 1.006  PHURINE 8.0  GLUCOSEU NEGATIVE  HGBUR MODERATE*  BILIRUBINUR NEGATIVE  KETONESUR 5*  PROTEINUR 100*  NITRITE NEGATIVE  LEUKOCYTESUR NEGATIVE     Imaging:  CT Cervical Spine Wo Contrast Result Date: 11/11/2023 CLINICAL DATA:  Patient was found on the floor by her neighbor today. Last seen well yesterday. EXAM: CT CERVICAL SPINE WITHOUT CONTRAST TECHNIQUE: Multidetector CT imaging of the cervical spine was performed without intravenous contrast. Multiplanar CT image reconstructions were also generated. RADIATION DOSE REDUCTION: This exam was performed according to the departmental dose-optimization program which includes automated exposure  control, adjustment of the mA and/or kV according to patient size and/or use of iterative reconstruction technique. COMPARISON:  MRI of the cervical spine 10/31/2021 FINDINGS: Alignment: Grade 1 anterolisthesis at C4-5 and C5-6 is stable. No other significant listhesis is present. Cervical lordosis is within normal limits. Exaggerated thoracic kyphosis is present. Skull base and vertebrae: The craniocervical junction is normal. Vertebral body heights are normal. No acute fractures are present. Soft tissues and spinal canal: No prevertebral fluid or swelling. No visible canal hematoma. Disc levels: Asymmetric left foraminal narrowing is present at C3-4, C4-5, C5-6 and C6-7 due to uncovertebral and facet hypertrophy. Upper chest: The lung apices are clear. The thoracic inlet is within normal limits. Gaseous distension of the upper soft Gus is noted. IMPRESSION: 1. No acute fracture or traumatic subluxation. 2. Stable grade 1 anterolisthesis at C4-5 and C5-6. 3. Asymmetric left foraminal  narrowing at C3-4, C4-5, C5-6 and C6-7 due to uncovertebral and facet hypertrophy. Electronically Signed   By: Marin Roberts M.D.   On: 11/11/2023 14:20   CT HEAD WO CONTRAST Result Date: 11/11/2023 CLINICAL DATA:  Neuro deficit, acute, stroke suspected. Patient was found in her apartment by a neighbor today. Last seen well yesterday. EXAM: CT HEAD WITHOUT CONTRAST TECHNIQUE: Contiguous axial images were obtained from the base of the skull through the vertex without intravenous contrast. RADIATION DOSE REDUCTION: This exam was performed according to the departmental dose-optimization program which includes automated exposure control, adjustment of the mA and/or kV according to patient size and/or use of iterative reconstruction technique. COMPARISON:  MR head without and with contrast 03/14/2023 FINDINGS: Brain: Mild periventricular and scattered subcortical white matter hypoattenuation is stable from prior exams. No acute  infarct, hemorrhage, or mass lesion is present. Deep brain nuclei are within normal limits. The ventricles are of normal size. No significant extraaxial fluid collection is present. The brainstem and cerebellum are within normal limits. Midline structures are within normal limits. Vascular: No hyperdense vessel or unexpected calcification. Skull: Scalp soft tissue swelling is present to the right of midline near the vertex. No underlying fracture or foreign body is present. No other significant extracranial soft tissue injury is present. Sinuses/Orbits: The paranasal sinuses and mastoid air cells are clear. Bilateral lens replacements are noted. Globes and orbits are otherwise unremarkable. IMPRESSION: 1. No acute intracranial abnormality or significant interval change. 2. Stable mild periventricular and scattered subcortical white matter hypoattenuation. This likely reflects the sequela of chronic microvascular ischemia. 3. Scalp soft tissue swelling to the right of midline near the vertex without underlying fracture or foreign body. Electronically Signed   By: Marin Roberts M.D.   On: 11/11/2023 14:17     Assessment & Plan:  77 y.o. female with a PMHx of hypertension, anxiety disorder, hyperlipidemia, history of CVA, previous history of hyponatremia leading to seizure now admitted to the hospital after a fall and some confusion as per the neighbors.  She was found to have a sodium of 116.  When I spoke to her she is awake and alert oriented x 3.  Patient says she has been drinking a lot of water for the last few days.  She denies any history of chest pain, shortness of breath or orthopnea.  Patient denies any history of fever chills.  She does not drink alcohol or smoke cigarettes.  She denies any use of diuretics.   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  I advised the patient to stay on 1 L fluid restriction.  Will continue with isotonic saline at 75 cc an hour.  Will continue to monitor sodium levels every 4 hours.  Will try to correct after 12 mEq of sodium in the first 24 hours.  Will also check uric acid and magnesium levels.  #2: Rhabdomyolysis: Will continue to monitor CPK levels.  Will continue IV fluids at the present rate for now.  Rhabdomyolysis may be secondary to fall complicated by statins.  #3: Hypertension: Will continue to monitor closely.  Will continue ACE inhibitor's if need be.  Spoke to patient's caregiver at bedside. Will monitor closely. LOS: 0 Rebeka Kimble  Suezanne Jacquet, MD [facility]. 12/28/20245:06 PM

## 2023-11-11 NOTE — Assessment & Plan Note (Addendum)
EDP discussed patient elevated high sensitive troponin with cardiology and per EDP, cardiology does not recommend heparin GTT at this time given multiple acute issues including rhabdomyolysis and hyponatremia Recheck second high sensitive troponin to monitor

## 2023-11-11 NOTE — Assessment & Plan Note (Signed)
Etiology workup in progress, differentials include SIADH, psychogenic polydipsia Check serum osmolality, urine osmolality, urine sodium level Nephrology has been consulted by EDP and per EDP nephrology service recommends sodium chloride infusion at 75 mL/h and check serum sodium level every 4 hours Admit to telemetry cardiac, inpatient

## 2023-11-11 NOTE — Assessment & Plan Note (Signed)
Insert urethral catheter with catheter care order placed on admission Strict I's and O's Nursing aware

## 2023-11-11 NOTE — Hospital Course (Signed)
Ms. Ahrianna Glassco is a 84 female with history of hypertension, GERD, osteoarthritis, history of hyponatremia, who presents emergency department for chief concerns of being found down on her apartment floor by her neighbor.  Patient was transported from [facility] independent living facility via EMS.  Vitals in the ED showed temperature of 98.5, respiration rate of 18, heart rate of 78, blood pressure 102/62, SpO2 of 100% on room air.  Serum sodium is 116, potassium 4.1, chloride 80, bicarb 21, BUN of 13, serum creatinine of 0.72, EGFR greater than 60, nonfasting blood glucose 138, WBC 10.4, hemoglobin 15.6, platelets of 371.  CK level was elevated at 3785.  High sensitive troponin was 1245.  ED treatment: Sodium chloride 1 L bolus.

## 2023-11-12 DIAGNOSIS — E871 Hypo-osmolality and hyponatremia: Secondary | ICD-10-CM | POA: Diagnosis not present

## 2023-11-12 LAB — CBC
HCT: 35.9 % — ABNORMAL LOW (ref 36.0–46.0)
Hemoglobin: 13.1 g/dL (ref 12.0–15.0)
MCH: 32.5 pg (ref 26.0–34.0)
MCHC: 36.5 g/dL — ABNORMAL HIGH (ref 30.0–36.0)
MCV: 89.1 fL (ref 80.0–100.0)
Platelets: 319 10*3/uL (ref 150–400)
RBC: 4.03 MIL/uL (ref 3.87–5.11)
RDW: 12.5 % (ref 11.5–15.5)
WBC: 6.4 10*3/uL (ref 4.0–10.5)
nRBC: 0 % (ref 0.0–0.2)

## 2023-11-12 LAB — BASIC METABOLIC PANEL
Anion gap: 9 (ref 5–15)
Anion gap: 14 (ref 5–15)
BUN: 13 mg/dL (ref 8–23)
BUN: 13 mg/dL (ref 8–23)
CO2: 19 mmol/L — ABNORMAL LOW (ref 22–32)
CO2: 17 mmol/L — ABNORMAL LOW (ref 22–32)
Calcium: 7.4 mg/dL — ABNORMAL LOW (ref 8.9–10.3)
Calcium: 7.8 mg/dL — ABNORMAL LOW (ref 8.9–10.3)
Chloride: 93 mmol/L — ABNORMAL LOW (ref 98–111)
Chloride: 97 mmol/L — ABNORMAL LOW (ref 98–111)
Creatinine, Ser: 0.58 mg/dL (ref 0.44–1.00)
Creatinine, Ser: 0.62 mg/dL (ref 0.44–1.00)
GFR, Estimated: >60 mL/min (ref >60)
GFR, Estimated: >60 mL/min (ref >60)
Glucose, Bld: 87 mg/dL (ref 70–99)
Glucose, Bld: 108 mg/dL — ABNORMAL HIGH (ref 70–99)
Potassium: 3.5 mmol/L (ref 3.5–5.1)
Potassium: 3.8 mmol/L (ref 3.5–5.1)
Sodium: 121 mmol/L — ABNORMAL LOW (ref 135–145)
Sodium: 125 mmol/L — ABNORMAL LOW (ref 135–145)

## 2023-11-12 LAB — CK: Total CK: 2323 U/L — ABNORMAL HIGH (ref 38–234)

## 2023-11-12 LAB — SODIUM: Sodium: 120 mmol/L — ABNORMAL LOW (ref 135–145)

## 2023-11-12 LAB — TROPONIN I (HIGH SENSITIVITY): Troponin I (High Sensitivity): 417 ng/L (ref <18)

## 2023-11-12 MED ORDER — LACTULOSE 10 GM/15ML PO SOLN
20.0000 g | Freq: Three times a day (TID) | ORAL | Status: DC
  Administered 2023-11-12 – 2023-11-13 (×4): 20 g via ORAL
  Filled 2023-11-12 (×4): qty 30

## 2023-11-12 MED ORDER — SODIUM CHLORIDE 1 G PO TABS
1.0000 g | ORAL_TABLET | Freq: Two times a day (BID) | ORAL | Status: DC
  Administered 2023-11-12 – 2023-11-14 (×4): 1 g via ORAL
  Filled 2023-11-12 (×4): qty 1

## 2023-11-12 MED ORDER — GADOBUTROL 1 MMOL/ML IV SOLN
5.0000 mL | Freq: Once | INTRAVENOUS | Status: AC | PRN
  Administered 2023-11-12: 5 mL via INTRAVENOUS

## 2023-11-12 NOTE — ED Notes (Signed)
Patient's cell phone plugged up to charger and placed on bedside tray table.

## 2023-11-12 NOTE — ED Notes (Signed)
Lab called to ensure BMP could be added on to previously sent tube

## 2023-11-12 NOTE — Progress Notes (Signed)
PROGRESS NOTE    Anna Castaneda  ZOX:[id number] DOB: 1946/08/02 DOA: 11/11/2023 PCP: Barbette Reichmann, MD  Chief Complaint  Patient presents with   Weakness    Hospital Course:  Anna Castaneda is 77 y.o. 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  Patient reports that she was feeling sluggish when she woke up in the morning and had to sit down while she was brushing her teeth.  She then recalls being woken up by her neighbors and was brought immediately to the ED.  Sodium on arrival 113, CK 3785, high-sensitivity Trope 1245.  Patient received 1 L NS bolus.  Nephrology was consulted.  Cardiology was consulted.  Patient was altered upon initial evaluation.  Subjective: Patient is significantly improved this morning.  She is alert and oriented x 4.  She is requesting something to eat.  She is requesting to go to the bathroom.  She does have Foley catheter in place.   Objective: Vitals:   11/12/23 1000 11/12/23 1100 11/12/23 1130 11/12/23 1200  BP: 115/64 126/75 134/74 126/73  Pulse: 61 73 72 73  Resp: 16 14 16 16   Temp:      TempSrc:      SpO2: 98% 100% 100% 100%  Weight:        Intake/Output Summary (Last 24 hours) at 11/12/2023 1348 Last data filed at 11/12/2023 1205 Gross per 24 hour  Intake 1999 ml  Output 4000 ml  Net -2001 ml   Filed Weights   11/11/23 1244  Weight: 51 kg    Examination: General exam: Appears calm and comfortable, NAD, thin Respiratory system: No work of breathing, symmetric chest wall expansion Cardiovascular system: S1 & S2 heard, RRR.  Gastrointestinal system: Abdomen is nondistended, soft and nontender.  Neuro: Alert and oriented. No focal neurological deficits. Extremities: Symmetric, expected ROM Skin: No rashes, lesions Psychiatry: Demonstrates appropriate judgement and insight. Mood & affect appropriate  for situation.   Assessment & Plan:  Principal Problem:   Hyponatremia Active Problems:   Essential hypertension   Dyslipidemia   Hypokalemia   Seizures (HCC)   Fall   GERD (gastroesophageal reflux disease)   Rhabdomyolysis   NSTEMI (non-ST elevated myocardial infarction) (HCC)   Acute urinary retention   Altered mental status  Hyponatremia - 113 on arrival -> 121 now. 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  Troponin elevation may be secondary to demand ischemia given rhabdomyolysis as below - Appreciate further cardiology recommendations  Acute metabolic encephalopathy -  altered on arrival, has returned to baseline now. - Suspect altered mentation was secondary to hyponatremia.  Unclear if patient also had seizure. - She reports that she has been taken off all seizure medications outpatient - Brain MRI without acute intracranial pathology  Seizure disorder - Patient had seizure provoked by hyponatremia in 2020, she had no further seizure episodes and Keppra was discontinued in 2022 - Will proceed with seizure precautions for now - Brain MRI negative - Unclear role of antiepileptics if sodium has normalized. -- Little utility in EEG at this  time  Rhabdomyolysis - CK 3785 on arrival, already downtrending now.  Will not trend further. - IV fluids as above - Suspect this is nontraumatic rhabdo potentially from seizure which patient has had previously due to hyponatremia   Constipation - Patient reports large volume intake is an attempt to cure her chronic constipation.  She reports  that this is a lifelong issue.  Minimal success with MiraLAX, docusate, senna. - Will initiate lactulose *please note patient has listed allergy of lactulose with side effect "diarrhea"  Given that this is the desired consequence of the medication we will proceed with prescribing for now.  Monitor for other side effects  Hypertension - Continue to monitor closely - Resume home meds   Hypokalemia Replace as needed  Anxiety disorder - Resume home meds       DVT prophylaxis: Heparin   Code Status: Full Code Family Communication: none at bedside, discussed directly with pt Disposition:  Status is: Inpatient Remains inpatient appropriate because: Work up ongoing    Consultants:  Nephro Cardiology    Procedures:  N/a  Antimicrobials:  Anti-infectives (From admission, onward)    None       Data Reviewed: I have personally reviewed following labs and imaging studies CBC: Recent Labs  Lab 11/11/23 1250 11/12/23 0418  WBC 10.4 6.4  NEUTROABS 9.2*  --   HGB 15.6* 13.1  HCT 42.5 35.9*  MCV 87.8 89.1  PLT 371 319   Basic Metabolic Panel: Recent Labs  Lab 11/11/23 1250 11/11/23 1615 11/11/23 1917 11/12/23 0301 11/12/23 0418  NA 116* 116* 118* 120* 121*  K 4.1  --  3.4*  --  3.5  CL 80*  --  88*  --  93*  CO2 21*  --  19*  --  19*  GLUCOSE 138*  --  99  --  87  BUN 13  --  12  --  13  CREATININE 0.72  --  0.56  --  0.58  CALCIUM 8.7*  --  7.7*  --  7.4*  MG 2.0  --  1.9  --   --    GFR: Estimated Creatinine Clearance: 47.4 mL/min (by C-G formula based on SCr of 0.58 mg/dL). Liver Function Tests: Recent Labs  Lab 11/11/23 1250 11/11/23 1917  AST 128* 104*  ALT 43 35  ALKPHOS 48 36*  BILITOT 1.3* 1.1  PROT 8.3* 6.2*  ALBUMIN 5.2* 3.9   CBG: Recent Labs  Lab 11/11/23 1510  GLUCAP 119*    No results found for this or any previous visit (from the past 240 hours).   Radiology Studies: MR BRAIN W WO CONTRAST Result Date: 11/12/2023 CLINICAL  DATA:  Mental status change, stroke suspected EXAM: MRI HEAD WITHOUT AND WITH CONTRAST TECHNIQUE: Multiplanar, multiecho pulse sequences of the brain and surrounding structures were obtained without and with intravenous contrast. CONTRAST:  5mL GADAVIST GADOBUTROL 1 MMOL/ML IV SOLN COMPARISON:  03/14/2023 MRI of the brain, 11/11/2023 CT head FINDINGS: Brain: No restricted diffusion to suggest acute or subacute infarct. No abnormal parenchymal or meningeal enhancement. No acute hemorrhage, mass, mass effect, or midline shift. No hydrocephalus or extra-axial collection. Pituitary and craniocervical junction within normal limits. No hemosiderin deposition to suggest remote hemorrhage. Normal cerebral volume for age. Scattered and confluent T2 hyperintense signal in the periventricular white matter, likely the sequela of mild-to-moderate chronic small vessel ischemic disease. Vascular: Normal arterial flow voids. Normal arterial and venous enhancement. Skull and upper cervical spine: Normal marrow signal. Sinuses/Orbits:  Clear paranasal sinuses. No acute finding in the orbits. Status post bilateral lens replacements. Other: The mastoid air cells are well aerated. IMPRESSION: No acute intracranial process. No evidence of acute or subacute infarct. Electronically Signed   By: Wiliam Ke M.D.   On: 11/12/2023 01:04   DG Abd Portable 1V Result Date: 11/11/2023 CLINICAL DATA:  Screening for metal prior to MRI. MRI clearance, neuro deficit. EXAM: PORTABLE ABDOMEN - 1 VIEW COMPARISON:  None Available. FINDINGS: No radiopaque foreign body or implanted medical device to preclude MRI imaging. Mild gaseous distention of bowel throughout the central abdomen. Multifocal vascular calcifications. The bones are under mineralized IMPRESSION: 1. No radiopaque foreign body or implanted medical device to preclude MRI imaging. 2. Mild gaseous distention of bowel throughout the central abdomen, favor ileus. Electronically Signed   By:  Narda Rutherford M.D.   On: 11/11/2023 23:31   DG Chest Port 1 View Result Date: 11/11/2023 CLINICAL DATA:  Altered mental status, found down EXAM: PORTABLE CHEST 1 VIEW COMPARISON:  None Available. FINDINGS: Cardiac shadow is enlarged but accentuated by the frontal technique. Aortic calcifications are seen. The lungs are well aerated bilaterally. No focal infiltrate or effusion is noted. No bony abnormality is seen. IMPRESSION: No active disease. Electronically Signed   By: Alcide Clever M.D.   On: 11/11/2023 21:14   CT Cervical Spine Wo Contrast Result Date: 11/11/2023 CLINICAL DATA:  Patient was found on the floor by her neighbor today. Last seen well yesterday. EXAM: CT CERVICAL SPINE WITHOUT CONTRAST TECHNIQUE: Multidetector CT imaging of the cervical spine was performed without intravenous contrast. Multiplanar CT image reconstructions were also generated. RADIATION DOSE REDUCTION: This exam was performed according to the departmental dose-optimization program which includes automated exposure control, adjustment of the mA and/or kV according to patient size and/or use of iterative reconstruction technique. COMPARISON:  MRI of the cervical spine 10/31/2021 FINDINGS: Alignment: Grade 1 anterolisthesis at C4-5 and C5-6 is stable. No other significant listhesis is present. Cervical lordosis is within normal limits. Exaggerated thoracic kyphosis is present. Skull base and vertebrae: The craniocervical junction is normal. Vertebral body heights are normal. No acute fractures are present. Soft tissues and spinal canal: No prevertebral fluid or swelling. No visible canal hematoma. Disc levels: Asymmetric left foraminal narrowing is present at C3-4, C4-5, C5-6 and C6-7 due to uncovertebral and facet hypertrophy. Upper chest: The lung apices are clear. The thoracic inlet is within normal limits. Gaseous distension of the upper soft Gus is noted. IMPRESSION: 1. No acute fracture or traumatic subluxation. 2.  Stable grade 1 anterolisthesis at C4-5 and C5-6. 3. Asymmetric left foraminal narrowing at C3-4, C4-5, C5-6 and C6-7 due to uncovertebral and facet hypertrophy. Electronically Signed   By: Marin Roberts M.D.   On: 11/11/2023 14:20   CT HEAD WO CONTRAST Result Date: 11/11/2023 CLINICAL DATA:  Neuro deficit, acute, stroke suspected. Patient was found in her apartment by a neighbor today. Last seen well yesterday. EXAM: CT HEAD WITHOUT CONTRAST TECHNIQUE: Contiguous axial images were obtained from the base of the skull through the vertex without intravenous contrast. RADIATION DOSE REDUCTION: This exam was performed according to the departmental dose-optimization program which includes automated exposure control, adjustment of the mA and/or kV according to patient size and/or use of iterative reconstruction technique. COMPARISON:  MR head without and with contrast 03/14/2023 FINDINGS: Brain: Mild periventricular and scattered subcortical white matter hypoattenuation is stable from prior exams. No acute infarct, hemorrhage, or mass lesion is present. Deep brain  nuclei are within normal limits. The ventricles are of normal size. No significant extraaxial fluid collection is present. The brainstem and cerebellum are within normal limits. Midline structures are within normal limits. Vascular: No hyperdense vessel or unexpected calcification. Skull: Scalp soft tissue swelling is present to the right of midline near the vertex. No underlying fracture or foreign body is present. No other significant extracranial soft tissue injury is present. Sinuses/Orbits: The paranasal sinuses and mastoid air cells are clear. Bilateral lens replacements are noted. Globes and orbits are otherwise unremarkable. IMPRESSION: 1. No acute intracranial abnormality or significant interval change. 2. Stable mild periventricular and scattered subcortical white matter hypoattenuation. This likely reflects the sequela of chronic  microvascular ischemia. 3. Scalp soft tissue swelling to the right of midline near the vertex without underlying fracture or foreign body. Electronically Signed   By: Marin Roberts M.D.   On: 11/11/2023 14:17    Scheduled Meds:  heparin  5,000 Units Subcutaneous Q8H   potassium chloride  20 mEq Oral Once   sodium chloride  1 g Oral BID WC   Continuous Infusions:  sodium chloride 100 mL/hr at 11/12/23 1207     LOS: 1 day    Time spent:   Debarah Crape, DO [facility]  To contact the attending physician between 7A-7P please use Epic Chat. To contact the covering physician during after hours 7P-7A, please review Amion.   11/12/2023, 1:48 PM   *This document has been created with the assistance of dictation software. Please excuse typographical errors. *

## 2023-11-12 NOTE — ED Notes (Signed)
Patient taken to MRI

## 2023-11-12 NOTE — ED Notes (Signed)
This RN called into room by patient. Patient stating that she had on diamond stud earrings yesterday prior to MRI and that they are now missing. Patient's belongings and room 16 searched. MRI also called.   For future purposes, pt with clothing (1 pair shoes, 1 pair socks, 1 pair pants, 1 button up shirt, 1 robe), cell phone, kindle (plus kindle charger) at bedside.

## 2023-11-12 NOTE — ED Notes (Signed)
Pt sitting in bed. Requesting to use phone. Provided phone to pt. Pt in NAD.

## 2023-11-12 NOTE — ED Notes (Signed)
Pt states she needs to leave by 10 for personal training and pedicure appointment. No attending M.D. for her until day shift to discuss this with. Pt is also concerned about location of her cell phone., I searched the room and did not locate. Discussed with previous RN who believes it is at pt's home. Will provide pt [facility] phone number

## 2023-11-12 NOTE — Progress Notes (Signed)
[facility]  PROGRESS NOTE   Subjective:   Patient feels much better today.  Her friends at bedside.  Objective:  Vital signs: Blood pressure 126/73, pulse 73, temperature 98.8 F (37.1 C), temperature source Axillary, resp. rate 16, weight 51 kg, SpO2 100%.  Intake/Output Summary (Last 24 hours) at 11/12/2023 1222 Last data filed at 11/12/2023 1205 Gross per 24 hour  Intake 1999 ml  Output 4000 ml  Net -2001 ml   Filed Weights   11/11/23 1244  Weight: 51 kg     Physical Exam: General:  No acute distress  Head:  Normocephalic, atraumatic. Moist oral mucosal membranes  Eyes:  Anicteric  Neck:  Supple  Lungs:   Clear to auscultation, normal effort  Heart:  S1S2 no rubs  Abdomen:   Soft, nontender, bowel sounds present  Extremities:  peripheral edema.  Neurologic:  Awake, alert, following commands  Skin:  No lesions  Access:     Basic Metabolic Panel: Recent Labs  Lab 11/11/23 1250 11/11/23 1615 11/11/23 1917 11/12/23 0301 11/12/23 0418  NA 116* 116* 118* 120* 121*  K 4.1  --  3.4*  --  3.5  CL 80*  --  88*  --  93*  CO2 21*  --  19*  --  19*  GLUCOSE 138*  --  99  --  87  BUN 13  --  12  --  13  CREATININE 0.72  --  0.56  --  0.58  CALCIUM 8.7*  --  7.7*  --  7.4*  MG 2.0  --  1.9  --   --    GFR: Estimated Creatinine Clearance: 47.4 mL/min (by C-G formula based on SCr of 0.58 mg/dL).  Liver Function Tests: Recent Labs  Lab 11/11/23 1250 11/11/23 1917  AST 128* 104*  ALT 43 35  ALKPHOS 48 36*  BILITOT 1.3* 1.1  PROT 8.3* 6.2*  ALBUMIN 5.2* 3.9   No results for input(s): "LIPASE", "AMYLASE" in the last 168 hours. No results for input(s): "AMMONIA" in the last 168 hours.  CBC: Recent Labs  Lab 11/11/23 1250 11/12/23 0418  WBC 10.4 6.4  NEUTROABS 9.2*  --   HGB 15.6* 13.1  HCT 42.5 35.9*  MCV 87.8 89.1  PLT 371 319     HbA1C: Hgb A1c MFr Bld  Date/Time Value Ref Range Status  05/22/2019 04:55 AM 5.2 4.8 - 5.6 % Final     Comment:    (NOTE) Pre diabetes:          5.7%-6.4% Diabetes:              >6.4% Glycemic control for   <7.0% adults with diabetes     Urinalysis: Recent Labs    11/11/23 1615  COLORURINE STRAW*  LABSPEC 1.006  PHURINE 8.0  GLUCOSEU NEGATIVE  HGBUR MODERATE*  BILIRUBINUR NEGATIVE  KETONESUR 5*  PROTEINUR 100*  NITRITE NEGATIVE  LEUKOCYTESUR NEGATIVE      Imaging: MR BRAIN W WO CONTRAST Result Date: 11/12/2023 CLINICAL DATA:  Mental status change, stroke suspected EXAM: MRI HEAD WITHOUT AND WITH CONTRAST TECHNIQUE: Multiplanar, multiecho pulse sequences of the brain and surrounding structures were obtained without and with intravenous contrast. CONTRAST:  5mL GADAVIST GADOBUTROL 1 MMOL/ML IV SOLN COMPARISON:  03/14/2023 MRI of the brain, 11/11/2023 CT head FINDINGS: Brain: No restricted diffusion to suggest acute or subacute infarct. No abnormal parenchymal or meningeal enhancement. No acute hemorrhage, mass, mass effect, or midline shift. No hydrocephalus or extra-axial collection.  Pituitary and craniocervical junction within normal limits. No hemosiderin deposition to suggest remote hemorrhage. Normal cerebral volume for age. Scattered and confluent T2 hyperintense signal in the periventricular white matter, likely the sequela of mild-to-moderate chronic small vessel ischemic disease. Vascular: Normal arterial flow voids. Normal arterial and venous enhancement. Skull and upper cervical spine: Normal marrow signal. Sinuses/Orbits: Clear paranasal sinuses. No acute finding in the orbits. Status post bilateral lens replacements. Other: The mastoid air cells are well aerated. IMPRESSION: No acute intracranial process. No evidence of acute or subacute infarct. Electronically Signed   By: Wiliam Ke M.D.   On: 11/12/2023 01:04   DG Abd Portable 1V Result Date: 11/11/2023 CLINICAL DATA:  Screening for metal prior to MRI. MRI clearance, neuro deficit. EXAM: PORTABLE ABDOMEN - 1 VIEW  COMPARISON:  None Available. FINDINGS: No radiopaque foreign body or implanted medical device to preclude MRI imaging. Mild gaseous distention of bowel throughout the central abdomen. Multifocal vascular calcifications. The bones are under mineralized IMPRESSION: 1. No radiopaque foreign body or implanted medical device to preclude MRI imaging. 2. Mild gaseous distention of bowel throughout the central abdomen, favor ileus. Electronically Signed   By: Narda Rutherford M.D.   On: 11/11/2023 23:31   DG Chest Port 1 View Result Date: 11/11/2023 CLINICAL DATA:  Altered mental status, found down EXAM: PORTABLE CHEST 1 VIEW COMPARISON:  None Available. FINDINGS: Cardiac shadow is enlarged but accentuated by the frontal technique. Aortic calcifications are seen. The lungs are well aerated bilaterally. No focal infiltrate or effusion is noted. No bony abnormality is seen. IMPRESSION: No active disease. Electronically Signed   By: Alcide Clever M.D.   On: 11/11/2023 21:14   CT Cervical Spine Wo Contrast Result Date: 11/11/2023 CLINICAL DATA:  Patient was found on the floor by her neighbor today. Last seen well yesterday. EXAM: CT CERVICAL SPINE WITHOUT CONTRAST TECHNIQUE: Multidetector CT imaging of the cervical spine was performed without intravenous contrast. Multiplanar CT image reconstructions were also generated. RADIATION DOSE REDUCTION: This exam was performed according to the departmental dose-optimization program which includes automated exposure control, adjustment of the mA and/or kV according to patient size and/or use of iterative reconstruction technique. COMPARISON:  MRI of the cervical spine 10/31/2021 FINDINGS: Alignment: Grade 1 anterolisthesis at C4-5 and C5-6 is stable. No other significant listhesis is present. Cervical lordosis is within normal limits. Exaggerated thoracic kyphosis is present. Skull base and vertebrae: The craniocervical junction is normal. Vertebral body heights are normal. No  acute fractures are present. Soft tissues and spinal canal: No prevertebral fluid or swelling. No visible canal hematoma. Disc levels: Asymmetric left foraminal narrowing is present at C3-4, C4-5, C5-6 and C6-7 due to uncovertebral and facet hypertrophy. Upper chest: The lung apices are clear. The thoracic inlet is within normal limits. Gaseous distension of the upper soft Gus is noted. IMPRESSION: 1. No acute fracture or traumatic subluxation. 2. Stable grade 1 anterolisthesis at C4-5 and C5-6. 3. Asymmetric left foraminal narrowing at C3-4, C4-5, C5-6 and C6-7 due to uncovertebral and facet hypertrophy. Electronically Signed   By: Marin Roberts M.D.   On: 11/11/2023 14:20   CT HEAD WO CONTRAST Result Date: 11/11/2023 CLINICAL DATA:  Neuro deficit, acute, stroke suspected. Patient was found in her apartment by a neighbor today. Last seen well yesterday. EXAM: CT HEAD WITHOUT CONTRAST TECHNIQUE: Contiguous axial images were obtained from the base of the skull through the vertex without intravenous contrast. RADIATION DOSE REDUCTION: This exam was performed according to the  departmental dose-optimization program which includes automated exposure control, adjustment of the mA and/or kV according to patient size and/or use of iterative reconstruction technique. COMPARISON:  MR head without and with contrast 03/14/2023 FINDINGS: Brain: Mild periventricular and scattered subcortical white matter hypoattenuation is stable from prior exams. No acute infarct, hemorrhage, or mass lesion is present. Deep brain nuclei are within normal limits. The ventricles are of normal size. No significant extraaxial fluid collection is present. The brainstem and cerebellum are within normal limits. Midline structures are within normal limits. Vascular: No hyperdense vessel or unexpected calcification. Skull: Scalp soft tissue swelling is present to the right of midline near the vertex. No underlying fracture or foreign body is  present. No other significant extracranial soft tissue injury is present. Sinuses/Orbits: The paranasal sinuses and mastoid air cells are clear. Bilateral lens replacements are noted. Globes and orbits are otherwise unremarkable. IMPRESSION: 1. No acute intracranial abnormality or significant interval change. 2. Stable mild periventricular and scattered subcortical white matter hypoattenuation. This likely reflects the sequela of chronic microvascular ischemia. 3. Scalp soft tissue swelling to the right of midline near the vertex without underlying fracture or foreign body. Electronically Signed   By: Marin Roberts M.D.   On: 11/11/2023 14:17     Medications:    sodium chloride 100 mL/hr at 11/12/23 1207    heparin  5,000 Units Subcutaneous Q8H   potassium chloride  20 mEq Oral Once   sodium chloride  1 g Oral BID WC    Assessment/ Plan:     77 y.o. female with a PMHx of hypertension, anxiety disorder, hyperlipidemia, history of CVA, previous history of hyponatremia leading to seizure now admitted to the hospital after a fall and some confusion as per the neighbors. She was found to have a sodium of 116.  She has been drinking about 5 glasses of water along with 4 Pedialyte bottles of liquid and tea.    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  I advised the patient to stay on 1 L fluid restriction.  Will continue with isotonic saline at 100 cc an hour and also start her on salt tablets today..   #2: Rhabdomyolysis: Will continue to monitor CPK levels.  Will continue IV fluids at the present rate for now.  Rhabdomyolysis may be secondary to fall complicated by statins.   #3:  Hypertension: Will continue to monitor closely.  Will continue ACE inhibitor's if need be.  #4: Hypokalemia: Supplement potassium as ordered.  Labs and medications reviewed. Will continue to follow along with you.   LOS: 1 Lorain Childes, MD [facility] 12/29/202412:22 PM

## 2023-11-13 DIAGNOSIS — E871 Hypo-osmolality and hyponatremia: Secondary | ICD-10-CM | POA: Diagnosis not present

## 2023-11-13 LAB — COMPREHENSIVE METABOLIC PANEL
ALT: 37 U/L (ref 0–44)
AST: 99 U/L — ABNORMAL HIGH (ref 15–41)
Albumin: 3.4 g/dL — ABNORMAL LOW (ref 3.5–5.0)
Alkaline Phosphatase: 31 U/L — ABNORMAL LOW (ref 38–126)
Anion gap: 9 (ref 5–15)
BUN: 14 mg/dL (ref 8–23)
CO2: 19 mmol/L — ABNORMAL LOW (ref 22–32)
Calcium: 7.4 mg/dL — ABNORMAL LOW (ref 8.9–10.3)
Chloride: 102 mmol/L (ref 98–111)
Creatinine, Ser: 0.53 mg/dL (ref 0.44–1.00)
GFR, Estimated: >60 mL/min (ref >60)
Glucose, Bld: 85 mg/dL (ref 70–99)
Potassium: 3.2 mmol/L — ABNORMAL LOW (ref 3.5–5.1)
Sodium: 130 mmol/L — ABNORMAL LOW (ref 135–145)
Total Bilirubin: 1 mg/dL (ref <1.2)
Total Protein: 5.9 g/dL — ABNORMAL LOW (ref 6.5–8.1)

## 2023-11-13 LAB — CBC WITH DIFFERENTIAL/PLATELET
Abs Immature Granulocytes: 0.04 10*3/uL (ref 0.00–0.07)
Basophils Absolute: 0 10*3/uL (ref 0.0–0.1)
Basophils Relative: 0 %
Eosinophils Absolute: 0 10*3/uL (ref 0.0–0.5)
Eosinophils Relative: 0 %
HCT: 37.8 % (ref 36.0–46.0)
Hemoglobin: 13.4 g/dL (ref 12.0–15.0)
Immature Granulocytes: 1 %
Lymphocytes Relative: 16 %
Lymphs Abs: 1.2 10*3/uL (ref 0.7–4.0)
MCH: 32.8 pg (ref 26.0–34.0)
MCHC: 35.4 g/dL (ref 30.0–36.0)
MCV: 92.6 fL (ref 80.0–100.0)
Monocytes Absolute: 0.8 10*3/uL (ref 0.1–1.0)
Monocytes Relative: 11 %
Neutro Abs: 5.5 10*3/uL (ref 1.7–7.7)
Neutrophils Relative %: 72 %
Platelets: 302 10*3/uL (ref 150–400)
RBC: 4.08 MIL/uL (ref 3.87–5.11)
RDW: 13.1 % (ref 11.5–15.5)
WBC: 7.6 10*3/uL (ref 4.0–10.5)
nRBC: 0 % (ref 0.0–0.2)

## 2023-11-13 LAB — PHOSPHORUS: Phosphorus: 2.1 mg/dL — ABNORMAL LOW (ref 2.5–4.6)

## 2023-11-13 LAB — SODIUM: Sodium: 127 mmol/L — ABNORMAL LOW (ref 135–145)

## 2023-11-13 LAB — MAGNESIUM: Magnesium: 2.2 mg/dL (ref 1.7–2.4)

## 2023-11-13 MED ORDER — LACTULOSE 10 GM/15ML PO SOLN
30.0000 g | Freq: Three times a day (TID) | ORAL | Status: DC
  Administered 2023-11-13 – 2023-11-14 (×2): 30 g via ORAL
  Filled 2023-11-13 (×2): qty 60

## 2023-11-13 MED ORDER — POTASSIUM PHOSPHATES 15 MMOLE/5ML IV SOLN
15.0000 mmol | Freq: Once | INTRAVENOUS | Status: AC
  Administered 2023-11-13: 15 mmol via INTRAVENOUS
  Filled 2023-11-13: qty 5

## 2023-11-13 NOTE — Progress Notes (Signed)
PROGRESS NOTE    Anna Castaneda  ZOX:[id number] DOB: 04-30-1946 DOA: 11/11/2023 PCP: Barbette Reichmann, MD  Chief Complaint  Patient presents with   Weakness    Hospital Course:  Anna Castaneda is 77 y.o. 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  Patient reports that she was feeling sluggish when she woke up in the morning and had to sit down while she was brushing her teeth.  She then recalls being woken up by her neighbors and was brought immediately to the ED.  Sodium on arrival 113, CK 3785, high-sensitivity Trope 1245.  Patient received 1 L NS bolus.  Nephrology was consulted.  Cardiology was consulted.  Patient was altered upon initial evaluation.  Subjective: No acute events overnight.  On evaluation this morning patient is anxious to return home.  She still has Foley in place at the time of our evaluation.  She reports her personal trainer has been by and is advising her on how much fluid to drink and helping to massage her muscles.  Objective: Vitals:   11/13/23 1410 11/13/23 1500 11/13/23 1539 11/13/23 1540  BP:  105/65 105/65 105/65  Pulse:  74 74 74  Resp:   14 14  Temp: 98.2 F (36.8 C)  98 F (36.7 C) 98 F (36.7 C)  TempSrc: Oral  Oral Oral  SpO2:  97%  97%  Weight:        Intake/Output Summary (Last 24 hours) at 11/13/2023 1706 Last data filed at 11/13/2023 1602 Gross per 24 hour  Intake 250 ml  Output 2000 ml  Net -1750 ml   Filed Weights   11/11/23 1244  Weight: 51 kg    Examination: General exam: Appears calm and comfortable, NAD, thin Respiratory system: No work of breathing, symmetric chest wall expansion Cardiovascular system: S1 & S2 heard, RRR.  Gastrointestinal system: Abdomen is nondistended, soft and nontender.  Neuro: Alert and oriented. No focal neurological deficits. Extremities: Symmetric, expected  ROM Skin: No rashes, lesions Psychiatry: Demonstrates appropriate judgement and insight. Mood & affect appropriate for situation.   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  Repeat CMP in a.m.  Troponin elevation - High-sensitivity troponin elevated to 1245 -> 1521 -> 417 - Cardiology was consulted from the ED - recommended no heparin for now - Patient's without chest pain -Troponin elevation may be secondary to demand ischemia given rhabdomyolysis as below - Appreciate further cardiology recommendations  Acute metabolic encephalopathy -  altered on arrival, has returned to baseline now. - Suspect altered mentation was secondary to hyponatremia.  Unclear if patient also had seizure. - She reports that she has been taken off all seizure medications outpatient - Brain MRI without acute intracranial pathology  Seizure disorder - Patient had seizure provoked by hyponatremia in 2020, she had no further seizure episodes and Keppra was  discontinued after 6 months. - Reviewed with neurology, no role for antiepileptics in this setting. - Avoid provoking by maintaining sodium levels as above - Brain MRI negative  Rhabdomyolysis - CK 3785 on arrival, peaked. already downtrending now.  Will not trend further. - IV fluids as above - Suspect this is nontraumatic rhabdo potentially from seizure which patient has had  previously due to hyponatremia   Constipation - Patient reports large volume intake is an attempt to cure her chronic constipation.  She reports that this is a lifelong issue.  Minimal success with MiraLAX, docusate, senna. - Will initiate lactulose *please note patient has listed allergy of lactulose with side effect "diarrhea"  Given that this is the desired consequence of the medication we will proceed with prescribing for now.  Monitor for other side effects  Hypertension - Continue to monitor closely - Resume home meds   Hypokalemia Replace as needed  Anxiety disorder - Resume home meds       DVT prophylaxis: Heparin   Code Status: Full Code Family Communication: none at bedside, discussed directly with pt Disposition:  Status is: Inpatient Remains inpatient appropriate because: Work up ongoing    Consultants:  Nephro Cardiology    Procedures:  N/a  Antimicrobials:  Anti-infectives (From admission, onward)    None       Data Reviewed: I have personally reviewed following labs and imaging studies CBC: Recent Labs  Lab 11/11/23 1250 11/12/23 0418 11/13/23 0511  WBC 10.4 6.4 7.6  NEUTROABS 9.2*  --  5.5  HGB 15.6* 13.1 13.4  HCT 42.5 35.9* 37.8  MCV 87.8 89.1 92.6  PLT 371 319 302   Basic Metabolic Panel: Recent Labs  Lab 11/11/23 1250 11/11/23 1615 11/11/23 1917 11/12/23 0301 11/12/23 0418 11/12/23 1436 11/13/23 0511 11/13/23 1428  NA 116*   < > 118* 120* 121* 125* 130* 127*  K 4.1  --  3.4*  --  3.5 3.8 3.2*  --   CL 80*  --  88*  --  93* 97* 102  --   CO2 21*  --  19*  --  19* 17* 19*  --   GLUCOSE 138*  --  99  --  87 108* 85  --   BUN 13  --  12  --  13 13 14   --   CREATININE 0.72  --  0.56  --  0.58 0.62 0.53  --   CALCIUM 8.7*  --  7.7*  --  7.4* 7.8* 7.4*  --   MG 2.0  --  1.9  --   --   --  2.2  --   PHOS  --   --   --   --   --   --  2.1*  --    < > = values in this interval not displayed.   GFR: Estimated Creatinine  Clearance: 47.4 mL/min (by C-G formula based on SCr of 0.53 mg/dL). Liver Function Tests: Recent Labs  Lab 11/11/23 1250 11/11/23 1917 11/13/23 0511  AST 128* 104* 99*  ALT 43 35 37  ALKPHOS 48 36* 31*  BILITOT 1.3* 1.1 1.0  PROT 8.3* 6.2* 5.9*  ALBUMIN 5.2* 3.9 3.4*   CBG: Recent Labs  Lab 11/11/23 1510  GLUCAP 119*    No results found for this or any previous visit (from the past 240 hours).   Radiology Studies: MR BRAIN W WO CONTRAST Result Date: 11/12/2023 CLINICAL DATA:  Mental status change, stroke suspected EXAM:  MRI HEAD WITHOUT AND WITH CONTRAST TECHNIQUE: Multiplanar, multiecho pulse sequences of the brain and surrounding structures were obtained without and with intravenous contrast. CONTRAST:  5mL GADAVIST GADOBUTROL 1 MMOL/ML IV SOLN COMPARISON:  03/14/2023 MRI of the brain, 11/11/2023 CT head FINDINGS: Brain: No restricted diffusion to suggest acute or subacute infarct. No abnormal parenchymal or meningeal enhancement. No acute hemorrhage, mass, mass effect, or midline shift. No hydrocephalus or extra-axial collection. Pituitary and craniocervical junction within normal limits. No hemosiderin deposition to suggest remote hemorrhage. Normal cerebral volume for age. Scattered and confluent T2 hyperintense signal in the periventricular white matter, likely the sequela of mild-to-moderate chronic small vessel ischemic disease. Vascular: Normal arterial flow voids. Normal arterial and venous enhancement. Skull and upper cervical spine: Normal marrow signal. Sinuses/Orbits: Clear paranasal sinuses. No acute finding in the orbits. Status post bilateral lens replacements. Other: The mastoid air cells are well aerated. IMPRESSION: No acute intracranial process. No evidence of acute or subacute infarct. Electronically Signed   By: Wiliam Ke M.D.   On: 11/12/2023 01:04   DG Abd Portable 1V Result Date: 11/11/2023 CLINICAL DATA:  Screening for metal prior to MRI. MRI clearance,  neuro deficit. EXAM: PORTABLE ABDOMEN - 1 VIEW COMPARISON:  None Available. FINDINGS: No radiopaque foreign body or implanted medical device to preclude MRI imaging. Mild gaseous distention of bowel throughout the central abdomen. Multifocal vascular calcifications. The bones are under mineralized IMPRESSION: 1. No radiopaque foreign body or implanted medical device to preclude MRI imaging. 2. Mild gaseous distention of bowel throughout the central abdomen, favor ileus. Electronically Signed   By: Narda Rutherford M.D.   On: 11/11/2023 23:31   DG Chest Port 1 View Result Date: 11/11/2023 CLINICAL DATA:  Altered mental status, found down EXAM: PORTABLE CHEST 1 VIEW COMPARISON:  None Available. FINDINGS: Cardiac shadow is enlarged but accentuated by the frontal technique. Aortic calcifications are seen. The lungs are well aerated bilaterally. No focal infiltrate or effusion is noted. No bony abnormality is seen. IMPRESSION: No active disease. Electronically Signed   By: Alcide Clever M.D.   On: 11/11/2023 21:14    Scheduled Meds:  heparin  5,000 Units Subcutaneous Q8H   lactulose  20 g Oral TID   potassium chloride  20 mEq Oral Once   sodium chloride  1 g Oral BID WC   Continuous Infusions:     LOS: 2 days    Time spent:   Debarah Crape, DO [facility]  To contact the attending physician between 7A-7P please use Epic Chat. To contact the covering physician during after hours 7P-7A, please review Amion.   11/13/2023, 5:06 PM   *This document has been created with the assistance of dictation software. Please excuse typographical errors. *

## 2023-11-13 NOTE — Progress Notes (Signed)
[facility]  PROGRESS NOTE   Subjective:   Patient sitting up in bed No family present States she feels well today No lower extremity edema Foley catheter in place, nursing at bedside to remove  Sodium 130  Objective:  Vital signs: Blood pressure (!) 98/56, pulse 74, temperature 98.3 F (36.8 C), temperature source Oral, resp. rate 16, weight 51 kg, SpO2 98%.  Intake/Output Summary (Last 24 hours) at 11/13/2023 1322 Last data filed at 11/13/2023 1034 Gross per 24 hour  Intake --  Output 2000 ml  Net -2000 ml   Filed Weights   11/11/23 1244  Weight: 51 kg     Physical Exam: General:  No acute distress  Head:  Normocephalic, atraumatic. Moist oral mucosal membranes  Eyes:  Anicteric  Lungs:   Clear to auscultation, normal effort  Heart:  S1S2 no rubs  Abdomen:   Soft, nontender, bowel sounds present  Extremities:   No peripheral edema.  Neurologic:  Awake, alert, following commands  Skin:  No lesions  Access: None    Basic Metabolic Panel: Recent Labs  Lab 11/11/23 1250 11/11/23 1615 11/11/23 1917 11/12/23 0301 11/12/23 0418 11/12/23 1436 11/13/23 0511  NA 116*   < > 118* 120* 121* 125* 130*  K 4.1  --  3.4*  --  3.5 3.8 3.2*  CL 80*  --  88*  --  93* 97* 102  CO2 21*  --  19*  --  19* 17* 19*  GLUCOSE 138*  --  99  --  87 108* 85  BUN 13  --  12  --  13 13 14   CREATININE 0.72  --  0.56  --  0.58 0.62 0.53  CALCIUM 8.7*  --  7.7*  --  7.4* 7.8* 7.4*  MG 2.0  --  1.9  --   --   --  2.2  PHOS  --   --   --   --   --   --  2.1*   < > = values in this interval not displayed.   GFR: Estimated Creatinine Clearance: 47.4 mL/min (by C-G formula based on SCr of 0.53 mg/dL).  Liver Function Tests: Recent Labs  Lab 11/11/23 1250 11/11/23 1917 11/13/23 0511  AST 128* 104* 99*  ALT 43 35 37  ALKPHOS 48 36* 31*  BILITOT 1.3* 1.1 1.0  PROT 8.3* 6.2* 5.9*  ALBUMIN 5.2* 3.9 3.4*   No results for input(s): "LIPASE", "AMYLASE" in the last 168  hours. No results for input(s): "AMMONIA" in the last 168 hours.  CBC: Recent Labs  Lab 11/11/23 1250 11/12/23 0418 11/13/23 0511  WBC 10.4 6.4 7.6  NEUTROABS 9.2*  --  5.5  HGB 15.6* 13.1 13.4  HCT 42.5 35.9* 37.8  MCV 87.8 89.1 92.6  PLT 371 319 302     HbA1C: Hgb A1c MFr Bld  Date/Time Value Ref Range Status  05/22/2019 04:55 AM 5.2 4.8 - 5.6 % Final    Comment:    (NOTE) Pre diabetes:          5.7%-6.4% Diabetes:              >6.4% Glycemic control for   <7.0% adults with diabetes     Urinalysis: Recent Labs    11/11/23 1615  COLORURINE STRAW*  LABSPEC 1.006  PHURINE 8.0  GLUCOSEU NEGATIVE  HGBUR MODERATE*  BILIRUBINUR NEGATIVE  KETONESUR 5*  PROTEINUR 100*  NITRITE NEGATIVE  LEUKOCYTESUR NEGATIVE  Imaging: MR BRAIN W WO CONTRAST Result Date: 11/12/2023 CLINICAL DATA:  Mental status change, stroke suspected EXAM: MRI HEAD WITHOUT AND WITH CONTRAST TECHNIQUE: Multiplanar, multiecho pulse sequences of the brain and surrounding structures were obtained without and with intravenous contrast. CONTRAST:  5mL GADAVIST GADOBUTROL 1 MMOL/ML IV SOLN COMPARISON:  03/14/2023 MRI of the brain, 11/11/2023 CT head FINDINGS: Brain: No restricted diffusion to suggest acute or subacute infarct. No abnormal parenchymal or meningeal enhancement. No acute hemorrhage, mass, mass effect, or midline shift. No hydrocephalus or extra-axial collection. Pituitary and craniocervical junction within normal limits. No hemosiderin deposition to suggest remote hemorrhage. Normal cerebral volume for age. Scattered and confluent T2 hyperintense signal in the periventricular white matter, likely the sequela of mild-to-moderate chronic small vessel ischemic disease. Vascular: Normal arterial flow voids. Normal arterial and venous enhancement. Skull and upper cervical spine: Normal marrow signal. Sinuses/Orbits: Clear paranasal sinuses. No acute finding in the orbits. Status post bilateral lens  replacements. Other: The mastoid air cells are well aerated. IMPRESSION: No acute intracranial process. No evidence of acute or subacute infarct. Electronically Signed   By: Wiliam Ke M.D.   On: 11/12/2023 01:04   DG Abd Portable 1V Result Date: 11/11/2023 CLINICAL DATA:  Screening for metal prior to MRI. MRI clearance, neuro deficit. EXAM: PORTABLE ABDOMEN - 1 VIEW COMPARISON:  None Available. FINDINGS: No radiopaque foreign body or implanted medical device to preclude MRI imaging. Mild gaseous distention of bowel throughout the central abdomen. Multifocal vascular calcifications. The bones are under mineralized IMPRESSION: 1. No radiopaque foreign body or implanted medical device to preclude MRI imaging. 2. Mild gaseous distention of bowel throughout the central abdomen, favor ileus. Electronically Signed   By: Narda Rutherford M.D.   On: 11/11/2023 23:31   DG Chest Port 1 View Result Date: 11/11/2023 CLINICAL DATA:  Altered mental status, found down EXAM: PORTABLE CHEST 1 VIEW COMPARISON:  None Available. FINDINGS: Cardiac shadow is enlarged but accentuated by the frontal technique. Aortic calcifications are seen. The lungs are well aerated bilaterally. No focal infiltrate or effusion is noted. No bony abnormality is seen. IMPRESSION: No active disease. Electronically Signed   By: Alcide Clever M.D.   On: 11/11/2023 21:14   CT Cervical Spine Wo Contrast Result Date: 11/11/2023 CLINICAL DATA:  Patient was found on the floor by her neighbor today. Last seen well yesterday. EXAM: CT CERVICAL SPINE WITHOUT CONTRAST TECHNIQUE: Multidetector CT imaging of the cervical spine was performed without intravenous contrast. Multiplanar CT image reconstructions were also generated. RADIATION DOSE REDUCTION: This exam was performed according to the departmental dose-optimization program which includes automated exposure control, adjustment of the mA and/or kV according to patient size and/or use of iterative  reconstruction technique. COMPARISON:  MRI of the cervical spine 10/31/2021 FINDINGS: Alignment: Grade 1 anterolisthesis at C4-5 and C5-6 is stable. No other significant listhesis is present. Cervical lordosis is within normal limits. Exaggerated thoracic kyphosis is present. Skull base and vertebrae: The craniocervical junction is normal. Vertebral body heights are normal. No acute fractures are present. Soft tissues and spinal canal: No prevertebral fluid or swelling. No visible canal hematoma. Disc levels: Asymmetric left foraminal narrowing is present at C3-4, C4-5, C5-6 and C6-7 due to uncovertebral and facet hypertrophy. Upper chest: The lung apices are clear. The thoracic inlet is within normal limits. Gaseous distension of the upper soft Gus is noted. IMPRESSION: 1. No acute fracture or traumatic subluxation. 2. Stable grade 1 anterolisthesis at C4-5 and C5-6. 3. Asymmetric left  foraminal narrowing at C3-4, C4-5, C5-6 and C6-7 due to uncovertebral and facet hypertrophy. Electronically Signed   By: Marin Roberts M.D.   On: 11/11/2023 14:20   CT HEAD WO CONTRAST Result Date: 11/11/2023 CLINICAL DATA:  Neuro deficit, acute, stroke suspected. Patient was found in her apartment by a neighbor today. Last seen well yesterday. EXAM: CT HEAD WITHOUT CONTRAST TECHNIQUE: Contiguous axial images were obtained from the base of the skull through the vertex without intravenous contrast. RADIATION DOSE REDUCTION: This exam was performed according to the departmental dose-optimization program which includes automated exposure control, adjustment of the mA and/or kV according to patient size and/or use of iterative reconstruction technique. COMPARISON:  MR head without and with contrast 03/14/2023 FINDINGS: Brain: Mild periventricular and scattered subcortical white matter hypoattenuation is stable from prior exams. No acute infarct, hemorrhage, or mass lesion is present. Deep brain nuclei are within normal limits.  The ventricles are of normal size. No significant extraaxial fluid collection is present. The brainstem and cerebellum are within normal limits. Midline structures are within normal limits. Vascular: No hyperdense vessel or unexpected calcification. Skull: Scalp soft tissue swelling is present to the right of midline near the vertex. No underlying fracture or foreign body is present. No other significant extracranial soft tissue injury is present. Sinuses/Orbits: The paranasal sinuses and mastoid air cells are clear. Bilateral lens replacements are noted. Globes and orbits are otherwise unremarkable. IMPRESSION: 1. No acute intracranial abnormality or significant interval change. 2. Stable mild periventricular and scattered subcortical white matter hypoattenuation. This likely reflects the sequela of chronic microvascular ischemia. 3. Scalp soft tissue swelling to the right of midline near the vertex without underlying fracture or foreign body. Electronically Signed   By: Marin Roberts M.D.   On: 11/11/2023 14:17     Medications:    potassium PHOSPHATE IVPB (in mmol) 15 mmol (11/13/23 1002)    heparin  5,000 Units Subcutaneous Q8H   lactulose  20 g Oral TID   potassium chloride  20 mEq Oral Once   sodium chloride  1 g Oral BID WC    Assessment/ Plan:     77 y.o. female with a PMHx of hypertension, anxiety disorder, hyperlipidemia, history of CVA, previous history of hyponatremia leading to seizure now admitted to the hospital after a fall and some confusion as per the neighbors. She was found to have a sodium of 116.  She has been drinking about 5 glasses of water along with 4 Pedialyte bottles of liquid and tea.    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  Sodium corrected to 130 today. Patient encouraged to maintain 1L fluid restriction.    #2: Rhabdomyolysis: Rhabdomyolysis may be secondary to fall complicated by statins. CK improving with IVF   #3: Hypertension: Will continue ACE inhibitor's if need be. Blood pressure stable for this patient   #4: Hypokalemia: Potassium 3.2. Supplementation per primary team    LOS: 2 [facility] 12/30/20241:22 PM

## 2023-11-13 NOTE — ED Notes (Addendum)
Pt now voiding spontaneously and is able to ambulate with SBA and without device in room. MD notified.

## 2023-11-13 NOTE — ED Notes (Addendum)
Report off to jesmeraldo rn c pod nurse.  Pt to room 31.

## 2023-11-13 NOTE — ED Notes (Signed)
This RN messaged HCP regarding bowel plan, as Pt states she has not had a BM since 11/11/2023.

## 2023-11-13 NOTE — ED Notes (Signed)
Pt helped repositioned in bed. Pt is in nad.

## 2023-11-14 DIAGNOSIS — R338 Other retention of urine: Secondary | ICD-10-CM

## 2023-11-14 DIAGNOSIS — E871 Hypo-osmolality and hyponatremia: Secondary | ICD-10-CM | POA: Diagnosis not present

## 2023-11-14 DIAGNOSIS — G9341 Metabolic encephalopathy: Secondary | ICD-10-CM

## 2023-11-14 DIAGNOSIS — R569 Unspecified convulsions: Secondary | ICD-10-CM | POA: Diagnosis not present

## 2023-11-14 DIAGNOSIS — M6282 Rhabdomyolysis: Secondary | ICD-10-CM

## 2023-11-14 DIAGNOSIS — I10 Essential (primary) hypertension: Secondary | ICD-10-CM

## 2023-11-14 DIAGNOSIS — I21A1 Myocardial infarction type 2: Secondary | ICD-10-CM

## 2023-11-14 DIAGNOSIS — E785 Hyperlipidemia, unspecified: Secondary | ICD-10-CM

## 2023-11-14 DIAGNOSIS — E876 Hypokalemia: Secondary | ICD-10-CM

## 2023-11-14 LAB — CBC WITH DIFFERENTIAL/PLATELET
Abs Immature Granulocytes: 0.01 10*3/uL (ref 0.00–0.07)
Basophils Absolute: 0 10*3/uL (ref 0.0–0.1)
Basophils Relative: 1 %
Eosinophils Absolute: 0.1 10*3/uL (ref 0.0–0.5)
Eosinophils Relative: 1 %
HCT: 40.8 % (ref 36.0–46.0)
Hemoglobin: 14.2 g/dL (ref 12.0–15.0)
Immature Granulocytes: 0 %
Lymphocytes Relative: 27 %
Lymphs Abs: 1.6 10*3/uL (ref 0.7–4.0)
MCH: 32.1 pg (ref 26.0–34.0)
MCHC: 34.8 g/dL (ref 30.0–36.0)
MCV: 92.3 fL (ref 80.0–100.0)
Monocytes Absolute: 0.7 10*3/uL (ref 0.1–1.0)
Monocytes Relative: 12 %
Neutro Abs: 3.6 10*3/uL (ref 1.7–7.7)
Neutrophils Relative %: 59 %
Platelets: 323 10*3/uL (ref 150–400)
RBC: 4.42 MIL/uL (ref 3.87–5.11)
RDW: 13.1 % (ref 11.5–15.5)
WBC: 6.1 10*3/uL (ref 4.0–10.5)
nRBC: 0 % (ref 0.0–0.2)

## 2023-11-14 LAB — COMPREHENSIVE METABOLIC PANEL
ALT: 42 U/L (ref 0–44)
AST: 84 U/L — ABNORMAL HIGH (ref 15–41)
Albumin: 3.7 g/dL (ref 3.5–5.0)
Alkaline Phosphatase: 33 U/L — ABNORMAL LOW (ref 38–126)
Anion gap: 8 (ref 5–15)
BUN: 10 mg/dL (ref 8–23)
CO2: 26 mmol/L (ref 22–32)
Calcium: 8.2 mg/dL — ABNORMAL LOW (ref 8.9–10.3)
Chloride: 98 mmol/L (ref 98–111)
Creatinine, Ser: 0.54 mg/dL (ref 0.44–1.00)
GFR, Estimated: >60 mL/min (ref >60)
Glucose, Bld: 96 mg/dL (ref 70–99)
Potassium: 3.2 mmol/L — ABNORMAL LOW (ref 3.5–5.1)
Sodium: 132 mmol/L — ABNORMAL LOW (ref 135–145)
Total Bilirubin: 0.8 mg/dL (ref 0.0–1.2)
Total Protein: 6.4 g/dL — ABNORMAL LOW (ref 6.5–8.1)

## 2023-11-14 LAB — PHOSPHORUS: Phosphorus: 2.5 mg/dL (ref 2.5–4.6)

## 2023-11-14 LAB — MAGNESIUM: Magnesium: 2.1 mg/dL (ref 1.7–2.4)

## 2023-11-14 MED ORDER — SODIUM CHLORIDE 1 G PO TABS: 1.0000 g | ORAL_TABLET | Freq: Two times a day (BID) | ORAL | 0 refills | Status: AC

## 2023-11-14 MED ORDER — POTASSIUM CHLORIDE 20 MEQ PO PACK
40.0000 meq | PACK | Freq: Every day | ORAL | Status: DC
  Administered 2023-11-14: 40 meq via ORAL
  Filled 2023-11-14: qty 2

## 2023-11-14 MED ORDER — POTASSIUM CHLORIDE 20 MEQ PO PACK: 40.0000 meq | PACK | Freq: Every day | ORAL | 0 refills | Status: DC

## 2023-11-14 MED ORDER — BIOTIN 5 MG PO TABS: 5.0000 mg | ORAL_TABLET | Freq: Every day | ORAL | Status: DC

## 2023-11-14 NOTE — Progress Notes (Signed)
 [facility]  PROGRESS NOTE   Subjective:   Patient seen sitting up in bed Alert and oriented Tolerating meals without nausea or vomiting Attempting to maintain fluid restriction.  Sodium 132  Objective:  Vital signs: Blood pressure 102/77, pulse 89, temperature 97.8 F (36.6 C), temperature source Oral, resp. rate 19, height 5' 4 (1.626 m), weight 49.2 kg, SpO2 100%.  Intake/Output Summary (Last 24 hours) at 11/14/2023 1237 Last data filed at 11/14/2023 0300 Gross per 24 hour  Intake 250 ml  Output 0 ml  Net 250 ml   Filed Weights   11/11/23 1244 11/14/23 0315  Weight: 51 kg 49.2 kg     Physical Exam: General:  No acute distress  Head:  Normocephalic, atraumatic. Moist oral mucosal membranes  Eyes:  Anicteric  Lungs:   Clear to auscultation, normal effort  Heart:  S1S2 no rubs  Abdomen:   Soft, nontender, bowel sounds present  Extremities:   No peripheral edema.  Neurologic:  Awake, alert, following commands  Skin:  No lesions  Access: None    Basic Metabolic Panel: Recent Labs  Lab 11/11/23 1250 11/11/23 1615 11/11/23 1917 11/12/23 0301 11/12/23 0418 11/12/23 1436 11/13/23 0511 11/13/23 1428 11/14/23 0611  NA 116*   < > 118*   < > 121* 125* 130* 127* 132*  K 4.1  --  3.4*  --  3.5 3.8 3.2*  --  3.2*  CL 80*  --  88*  --  93* 97* 102  --  98  CO2 21*  --  19*  --  19* 17* 19*  --  26  GLUCOSE 138*  --  99  --  87 108* 85  --  96  BUN 13  --  12  --  13 13 14   --  10  CREATININE 0.72  --  0.56  --  0.58 0.62 0.53  --  0.54  CALCIUM  8.7*  --  7.7*  --  7.4* 7.8* 7.4*  --  8.2*  MG 2.0  --  1.9  --   --   --  2.2  --  2.1  PHOS  --   --   --   --   --   --  2.1*  --  2.5   < > = values in this interval not displayed.   GFR: Estimated Creatinine Clearance: 45.7 mL/min (by C-G formula based on SCr of 0.54 mg/dL).  Liver Function Tests: Recent Labs  Lab 11/11/23 1250 11/11/23 1917 11/13/23 0511 11/14/23 0611  AST 128* 104* 99* 84*   ALT 43 35 37 42  ALKPHOS 48 36* 31* 33*  BILITOT 1.3* 1.1 1.0 0.8  PROT 8.3* 6.2* 5.9* 6.4*  ALBUMIN 5.2* 3.9 3.4* 3.7   No results for input(s): LIPASE, AMYLASE in the last 168 hours. No results for input(s): AMMONIA in the last 168 hours.  CBC: Recent Labs  Lab 11/11/23 1250 11/12/23 0418 11/13/23 0511 11/14/23 0611  WBC 10.4 6.4 7.6 6.1  NEUTROABS 9.2*  --  5.5 3.6  HGB 15.6* 13.1 13.4 14.2  HCT 42.5 35.9* 37.8 40.8  MCV 87.8 89.1 92.6 92.3  PLT 371 319 302 323     HbA1C: Hgb A1c MFr Bld  Date/Time Value Ref Range Status  05/22/2019 04:55 AM 5.2 4.8 - 5.6 % Final    Comment:    (NOTE) Pre diabetes:          5.7%-6.4% Diabetes:              >  6.4% Glycemic control for   <7.0% adults with diabetes     Urinalysis: Recent Labs    11/11/23 1615  COLORURINE STRAW*  LABSPEC 1.006  PHURINE 8.0  GLUCOSEU NEGATIVE  HGBUR MODERATE*  BILIRUBINUR NEGATIVE  KETONESUR 5*  PROTEINUR 100*  NITRITE NEGATIVE  LEUKOCYTESUR NEGATIVE      Imaging: No results found.    Medications:      heparin   5,000 Units Subcutaneous Q8H   lactulose   30 g Oral TID   potassium chloride   40 mEq Oral Daily   sodium chloride   1 g Oral BID WC    Assessment/ Plan:     77 y.o. female with a PMHx of hypertension, anxiety disorder, hyperlipidemia, history of CVA, previous history of hyponatremia leading to seizure now admitted to the hospital after a fall and some confusion as per the neighbors. She was found to have a sodium of 116.  She has been drinking about 5 glasses of water along with 4 Pedialyte bottles of liquid and tea.    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   Sodium 132 today. Maintain fluid restriction and salt tabs.     #2: Rhabdomyolysis: Rhabdomyolysis may be secondary to fall complicated by statins.    #3: Hypertension: Will continue ACE inhibitor's if need be. Blood pressure stable  #4: Hypokalemia: Potassium remains 3.2. Supplementation per primary team    LOS: 3 [facility] 12/31/202412:37 PM

## 2023-11-14 NOTE — Evaluation (Signed)
 Physical Therapy Evaluation Patient Details Name: Anna Castaneda MRN: [id number] DOB: 02/02/1946 Today's Date: 11/14/2023  History of Present Illness  Carlina Derks is 77 y.o. 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 Sodium on arrival 113, CK 3785, high-sensitivity Trope 1245.  Patient received 1 L NS bolus.  Clinical Impression  The pt presents this session in good spirits. She demonstrates her baseline level of functioning during gait and balance activity this session. The pt does have some unsteadiness noted during today's session, however it is also important to note that she was not wearing her orthoses in her sneakers this session. The pt is safe to discharge from a PT standpoint once medically stable. PT will sign off.       If plan is discharge home, recommend the following:     Can travel by private vehicle        Equipment Recommendations    Recommendations for Other Services       Functional Status Assessment Patient has not had a recent decline in their functional status     Precautions / Restrictions Precautions Precautions: None      Mobility  Bed Mobility               General bed mobility comments: Pt upright in bedside chair upon arrival.    Transfers Overall transfer level: Needs assistance Equipment used: None Transfers: Sit to/from Stand Sit to Stand: Independent                Ambulation/Gait Ambulation/Gait assistance: Contact guard assist Gait Distance (Feet): 250 Feet Assistive device: None         General Gait Details: Patient ambulates with bilateral foot orthoses at baseline. Today's session completed without orthoses but shoes donned.  Stairs            Wheelchair Mobility     Tilt Bed    Modified Rankin (Stroke Patients Only)       Balance Overall balance assessment:  Needs assistance Sitting-balance support: No upper extremity supported, Feet supported Sitting balance-Leahy Scale: Normal     Standing balance support: No upper extremity supported Standing balance-Leahy Scale: Good                   Standardized Balance Assessment Standardized Balance Assessment : Dynamic Gait Index   Dynamic Gait Index Level Surface: Normal Change in Gait Speed: Mild Impairment Gait with Horizontal Head Turns: Mild Impairment Gait with Vertical Head Turns: Mild Impairment Gait and Pivot Turn: Mild Impairment Step Over Obstacle: Normal Step Around Obstacles: Normal Steps: Mild Impairment Total Score: 19       Pertinent Vitals/Pain Pain Assessment Pain Assessment: No/denies pain    Home Living Family/patient expects to be discharged to:: Assisted living Living Arrangements: Alone               Home Equipment: Agricultural Consultant (2 wheels)      Prior Function Prior Level of Function : Independent/Modified Independent                     Extremity/Trunk Assessment   Upper Extremity Assessment Upper Extremity Assessment: Overall WFL for tasks assessed    Lower Extremity Assessment Lower Extremity Assessment: Overall WFL for tasks assessed       Communication      Cognition Arousal: Alert Behavior During Therapy: WFL for tasks assessed/performed Overall  Cognitive Status: Within Functional Limits for tasks assessed                                          General Comments      Exercises Other Exercises Other Exercises: 5xSTS: 12.86s   Assessment/Plan    PT Assessment Patient does not need any further PT services  PT Problem List         PT Treatment Interventions      PT Goals (Current goals can be found in the Care Plan section)  Acute Rehab PT Goals Patient Stated Goal: Return home PT Goal Formulation: With patient Time For Goal Achievement: 11/15/23 Potential to Achieve Goals: Good     Frequency       Co-evaluation               AM-PAC PT 6 Clicks Mobility  Outcome Measure Help needed turning from your back to your side while in a flat bed without using bedrails?: None Help needed moving from lying on your back to sitting on the side of a flat bed without using bedrails?: None Help needed moving to and from a bed to a chair (including a wheelchair)?: None Help needed standing up from a chair using your arms (e.g., wheelchair or bedside chair)?: None Help needed to walk in hospital room?: None Help needed climbing 3-5 steps with a railing? : A Little 6 Click Score: 23    End of Session   Activity Tolerance: Patient tolerated treatment well Patient left: in chair;with call bell/phone within reach Nurse Communication: Mobility status PT Visit Diagnosis: Unsteadiness on feet (R26.81)    Time: 8854-8841 PT Time Calculation (min) (ACUTE ONLY): 13 min   Charges:   PT Evaluation $PT Eval Low Complexity: 1 Low   PT General Charges $$ ACUTE PT VISIT: 1 Visit         12:16 PM, 11/14/23 Benton Tooker A. Manya PT, DPT Physical Therapist - [facility]   Alishah Schulte A Deeric Cruise 11/14/2023, 12:13 PM

## 2023-11-14 NOTE — TOC Initial Note (Signed)
 Transition of Care [facility]) - Initial/Assessment Note    Patient Details  Name: Anna Castaneda MRN: [id number] Date of Birth: 12/31/1945  Transition [facility]) CM/SW Contact:    Tomasa JAYSON Childes, RN Phone Number: 11/14/2023, 9:10 AM  Clinical Narrative:                 Patient admitted from [facility].         Patient Goals and CMS Choice            Expected Discharge Plan and Services                                              Prior Living Arrangements/Services                       Activities of Daily Living   ADL Screening (condition at time of admission) Independently performs ADLs?: Yes (appropriate for developmental age) Is the patient deaf or have difficulty hearing?: No Does the patient have difficulty seeing, even when wearing glasses/contacts?: No Does the patient have difficulty concentrating, remembering, or making decisions?: No  Permission Sought/Granted                  Emotional Assessment              Admission diagnosis:  Hyponatremia [E87.1] Weakness [R53.1] Elevated troponin [R79.89] Non-traumatic rhabdomyolysis [M62.82] Patient Active Problem List   Diagnosis Date Noted   Rhabdomyolysis 11/11/2023   NSTEMI (non-ST elevated myocardial infarction) (HCC) 11/11/2023   Acute urinary retention 11/11/2023   Altered mental status 11/11/2023   Seizures (HCC) 05/24/2019   Fall 05/24/2019   Thigh hematoma, right, sequela 05/24/2019   GERD (gastroesophageal reflux disease) 05/24/2019   Hypocalcemia 05/24/2019   Hemorrhagic shock (HCC) 05/22/2019   Essential hypertension 05/22/2019   Dyslipidemia 05/22/2019   Hypokalemia 05/22/2019   Stroke-like episode (HCC) s/p tPA 05/21/2019   Hyponatremia    PCP:  Sadie Manna, MD Pharmacy:   [id number] [place], [place] - [address] AT [facility] OF [address] [place] [address] Phone: [phone] Fax:  [phone]     Social Drivers of Health (SDOH) Social History: SDOH Screenings   Food Insecurity: No Food Insecurity (11/13/2023)  Housing: Low Risk  (11/13/2023)  Transportation Needs: No Transportation Needs (11/13/2023)  Utilities: Not At Risk (11/13/2023)  Alcohol Screen: Low Risk  (08/22/2023)  Financial Resource Strain: Low Risk  (09/05/2023)   Received from [facility]  Physical Activity: Sufficiently Active (08/22/2023)  Social Connections: Moderately Integrated (11/13/2023)  Stress: No Stress Concern Present (08/22/2023)  Tobacco Use: Medium Risk (11/11/2023)   SDOH Interventions:     Readmission Risk Interventions     No data to display

## 2023-11-14 NOTE — Plan of Care (Signed)

## 2023-11-14 NOTE — Discharge Summary (Signed)
 Physician Discharge Summary   Patient: Anna Castaneda MRN: [id number] DOB: 1946-02-10  Admit date:     11/11/2023  Discharge date: 11/14/23  Discharge Physician: Concepcion Riser   PCP: Sadie Manna, MD   Recommendations at discharge:    PCP follow up in 1 week with BMP.  Discharge Diagnoses: Principal Problem:   Hyponatremia Active Problems:   Essential hypertension   Dyslipidemia   Hypokalemia   Seizures (HCC)   Fall   GERD (gastroesophageal reflux disease)   Rhabdomyolysis   NSTEMI (non-ST elevated myocardial infarction) (HCC)   Acute urinary retention   Altered mental status  Resolved Problems:   * No resolved hospital problems. *  Hospital Course: Anna Castaneda is 77 y.o. 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  Patient reports that she was feeling sluggish when she woke up in the morning and had to sit down while she was brushing her teeth.  She then recalls being woken up by her neighbors and was brought immediately to the ED.  Sodium on arrival 113, CK 3785, high-sensitivity Trope 1245.  Patient received 1 L NS bolus.  Nephrology was consulted.  Cardiology was consulted.  Patient was altered upon initial evaluation.   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 Advised fluid restriction, salt tablet 1 g twice daily. Advised to follow-up with PCP for repeat BMP in 1 week upon discharge as instructed.   Troponin elevation High-sensitivity troponin elevated to 1245 -> 1521 -> 417 Cardiology was consulted from the ED - recommended no heparin  for now Patient's without chest pain. Troponin elevation may be secondary to demand ischemia given rhabdomyolysis as below   Acute  metabolic encephalopathy altered on arrival, has returned to baseline now. Suspect altered mentation was secondary to hyponatremia.  Unclear if patient also had seizure. She reports that she has been taken off all seizure medications outpatient Brain MRI without acute intracranial pathology   Seizure disorder Patient had seizure provoked by hyponatremia in 2020, she had no further seizure episodes and Keppra  was discontinued after 6 months. Reviewed with neurology, no role for antiepileptics in this setting. Avoid provoking by maintaining sodium levels as above Brain MRI negative   Rhabdomyolysis  CK 3785 on arrival, improved with IV fluids as above Suspect this is nontraumatic rhabdo potentially from seizure which patient has had previously due to hyponatremia   Constipation Minimal success with MiraLAX, docusate, senna. Started Lactulose  with good bm.   Hypertension Continue to monitor closely Resume home meds    Hypokalemia Potassium supplement sent to pharmacy. Outpatient follow up with PCP.   Anxiety disorder  Resume home meds       Consultants: Nephrology, cardiology. Procedures performed: None Disposition: Home Diet recommendation:  Discharge Diet Orders (From admission, onward)     Start     Ordered   11/14/23 0000  Diet - low sodium heart healthy        11/14/23 1216           Cardiac diet DISCHARGE MEDICATION: Allergies as of 11/14/2023       Reactions   Lactose Diarrhea   Lactose Intolerance (gi) Diarrhea        Medication List     TAKE these medications    ALPHA-D-GALACTOSIDASE PO Take 300 Units  by mouth.   aluminum hydroxide-magnesium carbonate 95-358 mg/15 mL Susp Commonly known as: GAVISCON Take by mouth every evening. nightly   aspirin  EC 81 MG tablet Take 81 mg by mouth daily.   atorvastatin  20 MG tablet Commonly known as: LIPITOR Take 20 mg by mouth daily.   Biotin  5 MG Tabs Take 1 tablet (5 mg total) by mouth  daily. What changed: how much to take   bismuth subsalicylate 262 MG/15ML suspension Commonly known as: PEPTO BISMOL Take by mouth.   CALCIUM  LACTATE PO Take by mouth.   cetirizine 10 MG tablet Commonly known as: ZYRTEC Take 10 mg by mouth daily as needed for allergies.   co-enzyme Q-10 50 MG capsule Take 200 mg by mouth daily. Ubidecarenone (Co-Enzyme Q-10, Ubiquinone) 200 tablet   denosumab 60 MG/ML Sosy injection Commonly known as: PROLIA Inject 60 mg into the skin every 6 (six) months.   FERROUS FUM-IRON POLYSACCH PO Take by mouth. Iron fum, poly #1-vitC-L.casei 130 mg iron-25 mg-30 mg Cap   ketoconazole 2 % cream Commonly known as: NIZORAL Apply 1 Application topically 2 (two) times daily.   lisinopril 10 MG tablet Commonly known as: ZESTRIL Take 10 mg by mouth daily.   MULTIVITAMIN PO Take by mouth. Multivitamin with B complex-vitamin C (FARBEE WITH C) tablet   NON FORMULARY Take by mouth gelatin sponge, absorb/porcine (Gelatine Absorbable MISC)   NON FORMULARY Take by mouth C, E, zinc, copper 11/omega3s/lut (OCUVITE ADULT50+ ORAL)   NON FORMULARY Benefiber, Guar gum, Oral   NON FORMULARY Calm with Calcium    NON FORMULARY CocoaVia   pantoprazole  20 MG tablet Commonly known as: PROTONIX  Take 20 mg by mouth daily.   potassium chloride  20 MEQ packet Commonly known as: KLOR-CON  Take 40 mEq by mouth daily for 10 days. Start taking on: November 15, 2023   saline Gel Place 1 Application into both nostrils every 4 (four) hours as needed.   SIMETHICONE PO Take 1 tablet by mouth every 6 (six) hours as needed (flutulence). GAS-X ORAL)   sodium chloride  1 g tablet Take 1 tablet (1 g total) by mouth 2 (two) times daily with a meal for 10 days.   Vitamin D3 50 MCG (2000 UT) capsule Take 2,000 Units by mouth daily.        Discharge Exam: Filed Weights   11/11/23 1244 11/14/23 0315  Weight: 51 kg 49.2 kg   General - Elderly Caucasian thin built  female, no apparent distress HEENT - PERRLA, EOMI, atraumatic head, non tender sinuses. Lung - Clear, rales, rhonchi, wheezes. Heart - S1, S2 heard, no murmurs, rubs, no pedal edema Neuro - Alert, awake and oriented x 3, non focal exam. Skin - Warm and dry.  Condition at discharge: stable  The results of significant diagnostics from this hospitalization (including imaging, microbiology, ancillary and laboratory) are listed below for reference.   Imaging Studies: MR BRAIN W WO CONTRAST Result Date: 11/12/2023 CLINICAL DATA:  Mental status change, stroke suspected EXAM: MRI HEAD WITHOUT AND WITH CONTRAST TECHNIQUE: Multiplanar, multiecho pulse sequences of the brain and surrounding structures were obtained without and with intravenous contrast. CONTRAST:  5mL GADAVIST  GADOBUTROL  1 MMOL/ML IV SOLN COMPARISON:  03/14/2023 MRI of the brain, 11/11/2023 CT head FINDINGS: Brain: No restricted diffusion to suggest acute or subacute infarct. No abnormal parenchymal or meningeal enhancement. No acute hemorrhage, mass, mass effect, or midline shift. No hydrocephalus or extra-axial collection. Pituitary and craniocervical junction within normal limits. No hemosiderin deposition to suggest remote hemorrhage.  Normal cerebral volume for age. Scattered and confluent T2 hyperintense signal in the periventricular white matter, likely the sequela of mild-to-moderate chronic small vessel ischemic disease. Vascular: Normal arterial flow voids. Normal arterial and venous enhancement. Skull and upper cervical spine: Normal marrow signal. Sinuses/Orbits: Clear paranasal sinuses. No acute finding in the orbits. Status post bilateral lens replacements. Other: The mastoid air cells are well aerated. IMPRESSION: No acute intracranial process. No evidence of acute or subacute infarct. Electronically Signed   By: Donald Campion M.D.   On: 11/12/2023 01:04   DG Abd Portable 1V Result Date: 11/11/2023 CLINICAL DATA:  Screening for  metal prior to MRI. MRI clearance, neuro deficit. EXAM: PORTABLE ABDOMEN - 1 VIEW COMPARISON:  None Available. FINDINGS: No radiopaque foreign body or implanted medical device to preclude MRI imaging. Mild gaseous distention of bowel throughout the central abdomen. Multifocal vascular calcifications. The bones are under mineralized IMPRESSION: 1. No radiopaque foreign body or implanted medical device to preclude MRI imaging. 2. Mild gaseous distention of bowel throughout the central abdomen, favor ileus. Electronically Signed   By: Andrea Gasman M.D.   On: 11/11/2023 23:31   DG Chest Port 1 View Result Date: 11/11/2023 CLINICAL DATA:  Altered mental status, found down EXAM: PORTABLE CHEST 1 VIEW COMPARISON:  None Available. FINDINGS: Cardiac shadow is enlarged but accentuated by the frontal technique. Aortic calcifications are seen. The lungs are well aerated bilaterally. No focal infiltrate or effusion is noted. No bony abnormality is seen. IMPRESSION: No active disease. Electronically Signed   By: Oneil Devonshire M.D.   On: 11/11/2023 21:14   CT Cervical Spine Wo Contrast Result Date: 11/11/2023 CLINICAL DATA:  Patient was found on the floor by her neighbor today. Last seen well yesterday. EXAM: CT CERVICAL SPINE WITHOUT CONTRAST TECHNIQUE: Multidetector CT imaging of the cervical spine was performed without intravenous contrast. Multiplanar CT image reconstructions were also generated. RADIATION DOSE REDUCTION: This exam was performed according to the departmental dose-optimization program which includes automated exposure control, adjustment of the mA and/or kV according to patient size and/or use of iterative reconstruction technique. COMPARISON:  MRI of the cervical spine 10/31/2021 FINDINGS: Alignment: Grade 1 anterolisthesis at C4-5 and C5-6 is stable. No other significant listhesis is present. Cervical lordosis is within normal limits. Exaggerated thoracic kyphosis is present. Skull base and  vertebrae: The craniocervical junction is normal. Vertebral body heights are normal. No acute fractures are present. Soft tissues and spinal canal: No prevertebral fluid or swelling. No visible canal hematoma. Disc levels: Asymmetric left foraminal narrowing is present at C3-4, C4-5, C5-6 and C6-7 due to uncovertebral and facet hypertrophy. Upper chest: The lung apices are clear. The thoracic inlet is within normal limits. Gaseous distension of the upper soft Gus is noted. IMPRESSION: 1. No acute fracture or traumatic subluxation. 2. Stable grade 1 anterolisthesis at C4-5 and C5-6. 3. Asymmetric left foraminal narrowing at C3-4, C4-5, C5-6 and C6-7 due to uncovertebral and facet hypertrophy. Electronically Signed   By: Lonni Necessary M.D.   On: 11/11/2023 14:20   CT HEAD WO CONTRAST Result Date: 11/11/2023 CLINICAL DATA:  Neuro deficit, acute, stroke suspected. Patient was found in her apartment by a neighbor today. Last seen well yesterday. EXAM: CT HEAD WITHOUT CONTRAST TECHNIQUE: Contiguous axial images were obtained from the base of the skull through the vertex without intravenous contrast. RADIATION DOSE REDUCTION: This exam was performed according to the departmental dose-optimization program which includes automated exposure control, adjustment of the mA and/or kV  according to patient size and/or use of iterative reconstruction technique. COMPARISON:  MR head without and with contrast 03/14/2023 FINDINGS: Brain: Mild periventricular and scattered subcortical white matter hypoattenuation is stable from prior exams. No acute infarct, hemorrhage, or mass lesion is present. Deep brain nuclei are within normal limits. The ventricles are of normal size. No significant extraaxial fluid collection is present. The brainstem and cerebellum are within normal limits. Midline structures are within normal limits. Vascular: No hyperdense vessel or unexpected calcification. Skull: Scalp soft tissue swelling is  present to the right of midline near the vertex. No underlying fracture or foreign body is present. No other significant extracranial soft tissue injury is present. Sinuses/Orbits: The paranasal sinuses and mastoid air cells are clear. Bilateral lens replacements are noted. Globes and orbits are otherwise unremarkable. IMPRESSION: 1. No acute intracranial abnormality or significant interval change. 2. Stable mild periventricular and scattered subcortical white matter hypoattenuation. This likely reflects the sequela of chronic microvascular ischemia. 3. Scalp soft tissue swelling to the right of midline near the vertex without underlying fracture or foreign body. Electronically Signed   By: Lonni Necessary M.D.   On: 11/11/2023 14:17    Microbiology: Results for orders placed or performed during the hospital encounter of 08/29/19  SARS CORONAVIRUS 2 (TAT 6-24 HRS) Nasopharyngeal Nasopharyngeal Swab     Status: None   Collection Time: 08/29/19 10:44 AM   Specimen: Nasopharyngeal Swab  Result Value Ref Range Status   SARS Coronavirus 2 NEGATIVE NEGATIVE Final    Comment: (NOTE) SARS-CoV-2 target nucleic acids are NOT DETECTED. The SARS-CoV-2 RNA is generally detectable in upper and lower respiratory specimens during the acute phase of infection. Negative results do not preclude SARS-CoV-2 infection, do not rule out co-infections with other pathogens, and should not be used as the sole basis for treatment or other patient management decisions. Negative results must be combined with clinical observations, patient history, and epidemiological information. The expected result is Negative. Fact Sheet for Patients: hairslick.no Fact Sheet for Healthcare Providers: quierodirigir.com This test is not yet approved or cleared by the [place]  FDA and  has been authorized for detection and/or diagnosis of SARS-CoV-2 by FDA under an Emergency  Use Authorization (EUA). This EUA will remain  in effect (meaning this test can be used) for the duration of the COVID-19 declaration under Section 56 4(b)(1) of the Act, 21 U.S.C. section 360bbb-3(b)(1), unless the authorization is terminated or revoked sooner. Performed at [facility], [address], [place], [place] [address]     Labs: CBC: Recent Labs  Lab 11/11/23 1250 11/12/23 0418 11/13/23 0511 11/14/23 0611  WBC 10.4 6.4 7.6 6.1  NEUTROABS 9.2*  --  5.5 3.6  HGB 15.6* 13.1 13.4 14.2  HCT 42.5 35.9* 37.8 40.8  MCV 87.8 89.1 92.6 92.3  PLT 371 319 302 323   Basic Metabolic Panel: Recent Labs  Lab 11/11/23 1250 11/11/23 1615 11/11/23 1917 11/12/23 0301 11/12/23 0418 11/12/23 1436 11/13/23 0511 11/13/23 1428 11/14/23 0611  NA 116*   < > 118*   < > 121* 125* 130* 127* 132*  K 4.1  --  3.4*  --  3.5 3.8 3.2*  --  3.2*  CL 80*  --  88*  --  93* 97* 102  --  98  CO2 21*  --  19*  --  19* 17* 19*  --  26  GLUCOSE 138*  --  99  --  87 108* 85  --  96  BUN 13  --  12  --  13 13 14   --  10  CREATININE 0.72  --  0.56  --  0.58 0.62 0.53  --  0.54  CALCIUM  8.7*  --  7.7*  --  7.4* 7.8* 7.4*  --  8.2*  MG 2.0  --  1.9  --   --   --  2.2  --  2.1  PHOS  --   --   --   --   --   --  2.1*  --  2.5   < > = values in this interval not displayed.   Liver Function Tests: Recent Labs  Lab 11/11/23 1250 11/11/23 1917 11/13/23 0511 11/14/23 0611  AST 128* 104* 99* 84*  ALT 43 35 37 42  ALKPHOS 48 36* 31* 33*  BILITOT 1.3* 1.1 1.0 0.8  PROT 8.3* 6.2* 5.9* 6.4*  ALBUMIN 5.2* 3.9 3.4* 3.7   CBG: Recent Labs  Lab 11/11/23 1510  GLUCAP 119*    Discharge time spent: 36 minutes.  Signed: Concepcion Riser, MD [facility] 11/14/2023

## 2024-06-06 ENCOUNTER — Other Ambulatory Visit: Payer: Self-pay | Admitting: Internal Medicine

## 2024-06-06 DIAGNOSIS — Z1231 Encounter for screening mammogram for malignant neoplasm of breast: Secondary | ICD-10-CM

## 2024-07-23 ENCOUNTER — Ambulatory Visit
Admission: RE | Admit: 2024-07-23 | Discharge: 2024-07-23 | Disposition: A | Discharge status: Home / Self Care | Source: Ambulatory Visit | Attending: Internal Medicine | Admitting: Internal Medicine

## 2024-07-23 DIAGNOSIS — Z1231 Encounter for screening mammogram for malignant neoplasm of breast: Secondary | ICD-10-CM | POA: Diagnosis present

## 2024-09-19 NOTE — Progress Notes (Signed)
 Chief Complaint  Patient presents with  . Follow-up    Patient states she strained her adductor muscle last week Patient needs a note explaining this so she can get her money back because she missed a cruise    HPI  Anna Castaneda is a 78 year old female here for request of a note to excuse her from a trip.  She has been working with a systems analyst and had a lifting injury causing a strain of her adductor muscle of the left hip and some of her flexor muscle of the left hip.  Has found it quite difficult to walk over the past week and a half.  Was planning on leaving November 1 through November 11 on a cruise.  She is currently working with her trainer to slowly build back mobility in the hip.  Is able to bear weight on the leg but quite difficult with abduction and external rotation of the hip.  Using herbal medicine to help with pain.  Has not tried any Tylenol  or NSAIDs.  Not interested in any medications.  Denies any numbness or tingling in the leg.  No back pain.  No loss of bowel or bladder.  ROS  Review of systems is unremarkable for any active cardiac, respiratory, GI, GU, hematologic, neurologic, dermatologic, HEENT, or psychiatric symptoms except as noted above.  No fevers, chills, or constitutional symptoms.   Current Outpatient Medications  Medication Sig Dispense Refill  . atorvastatin  (LIPITOR) 20 MG tablet Take 20 mg by mouth once daily    . atorvastatin  (LIPITOR) 20 MG tablet TAKE 1 TABLET DAILY (NEED APPOINTMENT FOR FURTHER REFILLS) 90 tablet 1  . biotin  5 mg Tab Take 5,000 mg by mouth once daily    . bismuth subsalicylate (PEPTO BISMOL) 262 mg/15 mL suspension Take 15 mLs by mouth every 4 (four) hours as needed for Indigestion    . CALCIUM  LACTATE ORAL Take 500 mg by mouth once daily    . cetirizine (ZYRTEC) 10 mg capsule Take 10 mg by mouth once daily    . cholecalciferol (VITAMIN D3) 2,000 unit capsule Take 2,000 Units by mouth once daily    . clobetasoL (CORMAX) 0.05 %  external solution APPLY LIGHT APPLICATION TO SCALP AT BEDTIME FOR 2 WEEKS THEN TWICE WEEKLY AS NEEDED    . cyanocobalamin (VITAMIN B12) 500 MCG tablet Take 500 mcg by mouth once daily    . denosumab (PROLIA SUBQ) every 6 (six) months       . desonide (DESOWEN) 0.05 % cream     . ferrous sulfate (IRON ORAL) Take 45 mg by mouth once daily    . GLUCOSAMINE-CONDROITIN-HERB182 ORAL Take 2 capsules by mouth once daily    . glutamine, bulk, powder Take 1 capsule by mouth once daily    . hydrocortisone 2.5 % cream Apply 1 Application topically as needed    . ketoconazole (NIZORAL) 2 % cream Apply topically 2 (two) times daily    . krill-om-3-dha-epa-phospho-ast (MEGARED OMEGA-3 KRILL OIL) 500-115-30-64 mg Cap Take 500 mg by mouth once daily    . lactulose  (ENULOSE ) 10 gram/15 mL oral solution Take 30 mLs by mouth 2 (two) times daily 946 mL 5  . lisinopriL (ZESTRIL) 10 MG tablet Take 1 tablet (10 mg total) by mouth once daily 90 tablet 3  . mag carb/aluminum hydrox/algin (GAVISCON EXTRA STRENGTH ORAL) Take 2 tablets by mouth at bedtime    . NON FORMULARY Take by mouth gelatin sponge,absorb/porcine (GELATIN ABSORBABLE MISC)    .  NON FORMULARY Take 1 capsule by mouth 2 (two) times daily C,E,zinc,copper 11/omega3s/lut (OCUVITE ADULT 50+ ORAL)    . NON FORMULARY once daily CocoaVia    . NON FORMULARY Take 1-2 tablets by mouth 2 (two) times daily Cataplex F    . NON FORMULARY Take 1 tablet by mouth 2 (two) times daily Pro-Enz    . NON FORMULARY Apply 30 mLs topically every other day Rosascea INO    . NON FORMULARY Take 1 tablet by mouth 2 (two) times daily REGENMAX Plus    . NON FORMULARY Take 5 oz by mouth as needed Raw Kombucha    . NON FORMULARY Take 1 Capful by mouth once daily Kefir    . pantoprazole  (PROTONIX ) 20 MG DR tablet TAKE 1 TABLET DAILY 90 tablet 3  . pantoprazole  (PROTONIX ) 20 MG DR tablet Take 20 mg by mouth once daily    . pimecrolimus (ELIDEL) 1 % cream APPLY TOPICALLY TO THE AFFECTED  AREA OF FACE TWICE DAILY UNTIL GONE    . tretinoin (RETIN-A) 0.05 % cream Apply 1 Application topically at bedtime     No current facility-administered medications for this visit.    Allergies as of 09/19/2024  . (No Known Allergies)    Patient Active Problem List  Diagnosis  . Broken heart syndrome  . Benign essential HTN  . Hyperlipidemia, mixed  . History of Mohs micrographic surgery for skin cancer  . Nonrheumatic aortic valve insufficiency  . Anxiety  . Dyslipidemia  . Gastroesophageal reflux disease  . Ingrown toenail  . Iron deficiency anemia secondary to inadequate dietary iron intake  . Osteopenia  . Osteoporosis  . Pain in finger  . Pure hypercholesterolemia  . Stroke-like episode  . Thrombocythemia  . Age-related osteoporosis without current pathological fracture  . Closed fracture of third metatarsal bone  . Primary osteoarthritis  . History of seizures  . Acute urinary retention  . Altered mental status  . Anemia  . Backache  . Contusion of hip  . Difficulty walking  . Dizziness and giddiness  . Hypertension  . Hypo-osmolality and hyponatremia  . Muscle weakness  . NSTEMI (non-ST elevated myocardial infarction) (CMS/HHS-HCC)  . Skin cancer of arm, right  . Skin cancer of face  . Stress incontinence of urine    Past Medical History:  Diagnosis Date  . Anxiety 01/14/2016  . Arthritis 2018  . Benign essential HTN 05/06/2019  . Colon polyp Benign  . GERD (gastroesophageal reflux disease) 2005  . Heart disease March 2017  . History of myocardial infarction 02/01/2016  . Hyperlipidemia, mixed 05/06/2019  . Iron deficiency anemia secondary to inadequate dietary iron intake 02/23/2016  . Osteoarthritis   . Osteoporosis 2018  . Prediabetes   . Seizures (CMS/HHS-HCC) 05/24/2019    Past Surgical History:  Procedure Laterality Date  . TONSILLECTOMY  1954  . TUBAL LIGATION  1980  . CATARACT EXTRACTION  2004   Both eyes  . COLONOSCOPY  09/02/2019    Negative colon biopsy/No Repeat/TKT  . COLONOSCOPY  05/18/2010  . UPPER GASTROINTESTINAL ENDOSCOPY  06/20/2011    Vitals:   09/19/24 1341  BP: 104/66  Pulse: 73  SpO2: 100%  Weight: 52.6 kg (116 lb)  Height: 162.6 cm (5' 4)  PainSc: 1   PainLoc: Buttocks   Body mass index is 19.91 kg/m.  Exam BP 104/66   Pulse 73   Ht 162.6 cm (5' 4)   Wt 52.6 kg (116 lb)   SpO2 100%  BMI 19.91 kg/m   General. Well appearing; NAD; VS reviewed     Eyes. Sclera and conjunctiva clear; Vision grossly intact; extraocular movements intact Oropharynx. No suspicious lesions Neck. Supple. No swelling, masses, thyroid normal size, no masses palpated.   Lungs. Respirations unlabored; clear to auscultation bilaterally Cardiovascular. Heart regular rate and rhythm without murmurs, gallops, or rubs MSK: Patient is able to stand and walk but quite slow to movements.  When stepping up on the table she does have some increasing pain into the left hip mostly on the medial aspect.  When assessing patient has good forward flexion and extension of the hip but with abduction and adduction and external rotation it does intensify the pain mainly into the groin line.  Strength is grossly intact but limited range of motion due to pain. Skin. Normal color and turgor   Assessment & Plan  Left hip pain Acute; agree with personal trainers assessment as it appears she has a adductor muscle strain and possibly an external hip flexor strain.  Agree with home exercises and can continue with heat.  Offered inflammatory but she wishes to hold off at this time.  Gentle stretching as tolerated.  Will write her a note to excuse her for her cruise as I do feel it would be quite difficult to do as much walking on the trip and will limit her experience.  Do not suspect any sciatica at this time.  No suspicion for fracture.  F/U: Patient to follow-up as needed.  JASON HESTLE WHITAKER, PA  This note has been created using  automated tools and reviewed for accuracy by JASON HESTLE WHITAKER.   Note: This dictation was prepared with Dragon dictation along with smaller phrase technology. Any transcriptional errors that result from this process are unintentional.

## 2024-09-29 ENCOUNTER — Inpatient Hospital Stay

## 2024-09-29 ENCOUNTER — Encounter
Admission: EM | Disposition: A | Discharge status: Home / Self Care | Payer: Self-pay | Source: Home / Self Care | Attending: Student

## 2024-09-29 ENCOUNTER — Inpatient Hospital Stay
Admission: EM | Admit: 2024-09-29 | Discharge: 2024-10-07 | DRG: 329 | Disposition: A | Discharge status: Home / Self Care | Attending: Student | Admitting: Student

## 2024-09-29 ENCOUNTER — Inpatient Hospital Stay: Admitting: Certified Registered"

## 2024-09-29 ENCOUNTER — Emergency Department

## 2024-09-29 ENCOUNTER — Other Ambulatory Visit: Payer: Self-pay

## 2024-09-29 DIAGNOSIS — K567 Ileus, unspecified: Secondary | ICD-10-CM | POA: Diagnosis not present

## 2024-09-29 DIAGNOSIS — I10 Essential (primary) hypertension: Secondary | ICD-10-CM | POA: Diagnosis present

## 2024-09-29 DIAGNOSIS — Z681 Body mass index (BMI) 19 or less, adult: Secondary | ICD-10-CM

## 2024-09-29 DIAGNOSIS — Z7983 Long term (current) use of bisphosphonates: Secondary | ICD-10-CM | POA: Diagnosis not present

## 2024-09-29 DIAGNOSIS — I252 Old myocardial infarction: Secondary | ICD-10-CM

## 2024-09-29 DIAGNOSIS — E861 Hypovolemia: Secondary | ICD-10-CM | POA: Diagnosis present

## 2024-09-29 DIAGNOSIS — E876 Hypokalemia: Secondary | ICD-10-CM | POA: Diagnosis not present

## 2024-09-29 DIAGNOSIS — K5939 Other megacolon: Secondary | ICD-10-CM | POA: Diagnosis present

## 2024-09-29 DIAGNOSIS — E871 Hypo-osmolality and hyponatremia: Secondary | ICD-10-CM | POA: Diagnosis present

## 2024-09-29 DIAGNOSIS — Z7982 Long term (current) use of aspirin: Secondary | ICD-10-CM | POA: Diagnosis not present

## 2024-09-29 DIAGNOSIS — K219 Gastro-esophageal reflux disease without esophagitis: Secondary | ICD-10-CM | POA: Diagnosis present

## 2024-09-29 DIAGNOSIS — M810 Age-related osteoporosis without current pathological fracture: Secondary | ICD-10-CM | POA: Diagnosis present

## 2024-09-29 DIAGNOSIS — Z91011 Allergy to milk products, unspecified: Secondary | ICD-10-CM

## 2024-09-29 DIAGNOSIS — Z9842 Cataract extraction status, left eye: Secondary | ICD-10-CM | POA: Diagnosis not present

## 2024-09-29 DIAGNOSIS — Z8673 Personal history of transient ischemic attack (TIA), and cerebral infarction without residual deficits: Secondary | ICD-10-CM | POA: Diagnosis not present

## 2024-09-29 DIAGNOSIS — K56609 Unspecified intestinal obstruction, unspecified as to partial versus complete obstruction: Secondary | ICD-10-CM | POA: Diagnosis not present

## 2024-09-29 DIAGNOSIS — Z9841 Cataract extraction status, right eye: Secondary | ICD-10-CM | POA: Diagnosis not present

## 2024-09-29 DIAGNOSIS — E162 Hypoglycemia, unspecified: Secondary | ICD-10-CM | POA: Diagnosis not present

## 2024-09-29 DIAGNOSIS — R54 Age-related physical debility: Secondary | ICD-10-CM | POA: Diagnosis present

## 2024-09-29 DIAGNOSIS — E785 Hyperlipidemia, unspecified: Secondary | ICD-10-CM | POA: Diagnosis present

## 2024-09-29 DIAGNOSIS — E43 Unspecified severe protein-calorie malnutrition: Secondary | ICD-10-CM | POA: Diagnosis present

## 2024-09-29 DIAGNOSIS — K562 Volvulus: Principal | ICD-10-CM | POA: Diagnosis present

## 2024-09-29 DIAGNOSIS — Z87891 Personal history of nicotine dependence: Secondary | ICD-10-CM

## 2024-09-29 DIAGNOSIS — R140 Abdominal distension (gaseous): Secondary | ICD-10-CM | POA: Diagnosis present

## 2024-09-29 DIAGNOSIS — Z79899 Other long term (current) drug therapy: Secondary | ICD-10-CM

## 2024-09-29 HISTORY — PX: APPLICATION OF WOUND VAC: SHX5189

## 2024-09-29 HISTORY — PX: LAPAROTOMY: SHX154

## 2024-09-29 LAB — OSMOLALITY: Osmolality: 260 mosm/kg — ABNORMAL LOW (ref 275–295)

## 2024-09-29 LAB — CBC WITH DIFFERENTIAL/PLATELET
Abs Immature Granulocytes: 0.03 K/uL (ref 0.00–0.07)
Basophils Absolute: 0 K/uL (ref 0.0–0.1)
Basophils Relative: 0 %
Eosinophils Absolute: 0 K/uL (ref 0.0–0.5)
Eosinophils Relative: 0 %
HCT: 39.1 % (ref 36.0–46.0)
Hemoglobin: 13.8 g/dL (ref 12.0–15.0)
Immature Granulocytes: 0 %
Lymphocytes Relative: 10 %
Lymphs Abs: 0.8 K/uL (ref 0.7–4.0)
MCH: 31.8 pg (ref 26.0–34.0)
MCHC: 35.3 g/dL (ref 30.0–36.0)
MCV: 90.1 fL (ref 80.0–100.0)
Monocytes Absolute: 0.4 K/uL (ref 0.1–1.0)
Monocytes Relative: 6 %
Neutro Abs: 6.2 K/uL (ref 1.7–7.7)
Neutrophils Relative %: 84 %
Platelets: 353 K/uL (ref 150–400)
RBC: 4.34 MIL/uL (ref 3.87–5.11)
RDW: 13.8 % (ref 11.5–15.5)
WBC: 7.4 K/uL (ref 4.0–10.5)
nRBC: 0 % (ref 0.0–0.2)

## 2024-09-29 LAB — COMPREHENSIVE METABOLIC PANEL WITH GFR
ALT: 17 U/L (ref 0–44)
AST: 34 U/L (ref 15–41)
Albumin: 4.4 g/dL (ref 3.5–5.0)
Alkaline Phosphatase: 44 U/L (ref 38–126)
Anion gap: 14 (ref 5–15)
BUN: 12 mg/dL (ref 8–23)
CO2: 23 mmol/L (ref 22–32)
Calcium: 9.3 mg/dL (ref 8.9–10.3)
Chloride: 86 mmol/L — ABNORMAL LOW (ref 98–111)
Creatinine, Ser: 0.83 mg/dL (ref 0.44–1.00)
GFR, Estimated: >60 mL/min (ref >60)
Glucose, Bld: 198 mg/dL — ABNORMAL HIGH (ref 70–99)
Potassium: 4.9 mmol/L (ref 3.5–5.1)
Sodium: 123 mmol/L — ABNORMAL LOW (ref 135–145)
Total Bilirubin: 0.5 mg/dL (ref 0.0–1.2)
Total Protein: 7.2 g/dL (ref 6.5–8.1)

## 2024-09-29 LAB — SODIUM
Sodium: 121 mmol/L — ABNORMAL LOW (ref 135–145)
Sodium: 122 mmol/L — ABNORMAL LOW (ref 135–145)

## 2024-09-29 LAB — SODIUM, URINE, RANDOM: Sodium, Ur: 66 mmol/L

## 2024-09-29 LAB — LIPASE, BLOOD: Lipase: 72 U/L — ABNORMAL HIGH (ref 11–51)

## 2024-09-29 LAB — OSMOLALITY, URINE: Osmolality, Ur: 453 mosm/kg (ref 300–900)

## 2024-09-29 LAB — LACTIC ACID, PLASMA: Lactic Acid, Venous: 1.6 mmol/L (ref 0.5–1.9)

## 2024-09-29 SURGERY — LAPAROTOMY, EXPLORATORY
Anesthesia: General | Site: Abdomen

## 2024-09-29 MED ORDER — SODIUM CHLORIDE 0.9 % IV SOLN: INTRAVENOUS | Status: DC

## 2024-09-29 MED ORDER — EPHEDRINE SULFATE-NACL 50-0.9 MG/10ML-% IV SOSY
PREFILLED_SYRINGE | INTRAVENOUS | Status: DC | PRN
  Administered 2024-09-29: 5 mg via INTRAVENOUS
  Administered 2024-09-29: 10 mg via INTRAVENOUS
  Administered 2024-09-29: 5 mg via INTRAVENOUS

## 2024-09-29 MED ORDER — KETAMINE HCL 50 MG/5ML IJ SOSY
PREFILLED_SYRINGE | INTRAMUSCULAR | Status: AC
  Filled 2024-09-29: qty 5

## 2024-09-29 MED ORDER — BUPIVACAINE-EPINEPHRINE (PF) 0.5% -1:200000 IJ SOLN
INTRAMUSCULAR | Status: DC | PRN
  Administered 2024-09-29: 60 mL via PERINEURAL

## 2024-09-29 MED ORDER — OXYCODONE HCL 5 MG PO TABS
5.0000 mg | ORAL_TABLET | ORAL | Status: DC | PRN
  Filled 2024-09-29: qty 1

## 2024-09-29 MED ORDER — BUPIVACAINE LIPOSOME 1.3 % IJ SUSP
INTRAMUSCULAR | Status: AC
Start: 2024-09-29 — End: 2024-09-29
  Filled 2024-09-29: qty 20

## 2024-09-29 MED ORDER — SODIUM CHLORIDE 0.9 % IV SOLN
2.0000 g | Freq: Two times a day (BID) | INTRAVENOUS | Status: DC
  Administered 2024-09-29 – 2024-09-30 (×3): 2 g via INTRAVENOUS
  Filled 2024-09-29 (×3): qty 2

## 2024-09-29 MED ORDER — ENOXAPARIN SODIUM 30 MG/0.3ML IJ SOSY: 30.0000 mg | PREFILLED_SYRINGE | INTRAMUSCULAR | Status: DC

## 2024-09-29 MED ORDER — 0.9 % SODIUM CHLORIDE (POUR BTL) OPTIME
TOPICAL | Status: DC | PRN
Start: 2024-09-29 — End: 2024-09-29
  Administered 2024-09-29: 1000 mL
  Administered 2024-09-29: 500 mL
  Administered 2024-09-29: 1000 mL

## 2024-09-29 MED ORDER — SEVOFLURANE IN SOLN
RESPIRATORY_TRACT | Status: AC
  Filled 2024-09-29: qty 250

## 2024-09-29 MED ORDER — ACETAMINOPHEN 650 MG RE SUPP: 650.0000 mg | Freq: Four times a day (QID) | RECTAL | Status: DC | PRN

## 2024-09-29 MED ORDER — LIDOCAINE HCL (CARDIAC) PF 100 MG/5ML IV SOSY
PREFILLED_SYRINGE | INTRAVENOUS | Status: DC | PRN
Start: 2024-09-29 — End: 2024-09-29
  Administered 2024-09-29: 60 mg via INTRAVENOUS

## 2024-09-29 MED ORDER — ENOXAPARIN SODIUM 40 MG/0.4ML IJ SOSY
40.0000 mg | PREFILLED_SYRINGE | INTRAMUSCULAR | Status: DC
  Administered 2024-09-29 – 2024-10-06 (×7): 40 mg via SUBCUTANEOUS
  Filled 2024-09-29 (×7): qty 0.4

## 2024-09-29 MED ORDER — PANTOPRAZOLE SODIUM 40 MG IV SOLR
40.0000 mg | INTRAVENOUS | Status: DC
  Administered 2024-09-30 – 2024-10-06 (×7): 40 mg via INTRAVENOUS
  Filled 2024-09-29 (×8): qty 10

## 2024-09-29 MED ORDER — CEFOTETAN DISODIUM 2 G IJ SOLR
INTRAMUSCULAR | Status: AC
  Filled 2024-09-29: qty 2

## 2024-09-29 MED ORDER — PHENYLEPHRINE 80 MCG/ML (10ML) SYRINGE FOR IV PUSH (FOR BLOOD PRESSURE SUPPORT)
PREFILLED_SYRINGE | INTRAVENOUS | Status: DC | PRN
  Administered 2024-09-29 (×5): 160 ug via INTRAVENOUS

## 2024-09-29 MED ORDER — ACETAMINOPHEN 10 MG/ML IV SOLN
INTRAVENOUS | Status: DC | PRN
  Administered 2024-09-29: 1000 mg via INTRAVENOUS

## 2024-09-29 MED ORDER — MORPHINE SULFATE (PF) 4 MG/ML IV SOLN: 4.0000 mg | INTRAVENOUS | Status: DC | PRN

## 2024-09-29 MED ORDER — DROPERIDOL 2.5 MG/ML IJ SOLN: 0.6250 mg | Freq: Once | INTRAMUSCULAR | Status: DC | PRN

## 2024-09-29 MED ORDER — PROPOFOL 10 MG/ML IV BOLUS
INTRAVENOUS | Status: DC | PRN
  Administered 2024-09-29: 70 mg via INTRAVENOUS
  Administered 2024-09-29: 30 mg via INTRAVENOUS

## 2024-09-29 MED ORDER — SUGAMMADEX SODIUM 200 MG/2ML IV SOLN
INTRAVENOUS | Status: DC | PRN
  Administered 2024-09-29: 200 mg via INTRAVENOUS

## 2024-09-29 MED ORDER — ONDANSETRON HCL 4 MG/2ML IJ SOLN: 4.0000 mg | Freq: Four times a day (QID) | INTRAMUSCULAR | Status: DC | PRN

## 2024-09-29 MED ORDER — FENTANYL CITRATE (PF) 100 MCG/2ML IJ SOLN
INTRAMUSCULAR | Status: AC
  Filled 2024-09-29: qty 2

## 2024-09-29 MED ORDER — LACTATED RINGERS IV SOLN: INTRAVENOUS | Status: DC | PRN

## 2024-09-29 MED ORDER — ROCURONIUM BROMIDE 100 MG/10ML IV SOLN
INTRAVENOUS | Status: DC | PRN
  Administered 2024-09-29: 10 mg via INTRAVENOUS
  Administered 2024-09-29: 70 mg via INTRAVENOUS

## 2024-09-29 MED ORDER — ONDANSETRON HCL 4 MG/2ML IJ SOLN
INTRAMUSCULAR | Status: DC | PRN
Start: 2024-09-29 — End: 2024-09-29
  Administered 2024-09-29: 4 mg via INTRAVENOUS

## 2024-09-29 MED ORDER — HYDROMORPHONE HCL 1 MG/ML IJ SOLN: 0.5000 mg | INTRAMUSCULAR | Status: DC | PRN

## 2024-09-29 MED ORDER — BUPIVACAINE-EPINEPHRINE (PF) 0.5% -1:200000 IJ SOLN
INTRAMUSCULAR | Status: AC
  Filled 2024-09-29: qty 30

## 2024-09-29 MED ORDER — ACETAMINOPHEN 10 MG/ML IV SOLN
INTRAVENOUS | Status: AC
  Filled 2024-09-29: qty 100

## 2024-09-29 MED ORDER — LACTATED RINGERS IV BOLUS
1000.0000 mL | Freq: Once | INTRAVENOUS | Status: AC
  Administered 2024-09-29: 1000 mL via INTRAVENOUS

## 2024-09-29 MED ORDER — HYDRALAZINE HCL 20 MG/ML IJ SOLN: 5.0000 mg | Freq: Four times a day (QID) | INTRAMUSCULAR | Status: DC | PRN

## 2024-09-29 MED ORDER — DEXAMETHASONE SOD PHOSPHATE PF 10 MG/ML IJ SOLN
INTRAMUSCULAR | Status: DC | PRN
  Administered 2024-09-29: 10 mg via INTRAVENOUS

## 2024-09-29 MED ORDER — SODIUM CHLORIDE 0.9 % IV SOLN: 1.0000 g | Freq: Two times a day (BID) | INTRAVENOUS | Status: DC

## 2024-09-29 MED ORDER — CHLORHEXIDINE GLUCONATE CLOTH 2 % EX PADS
6.0000 | MEDICATED_PAD | Freq: Every day | CUTANEOUS | Status: DC
  Administered 2024-09-30 – 2024-10-07 (×8): 6 via TOPICAL

## 2024-09-29 MED ORDER — FENTANYL CITRATE (PF) 100 MCG/2ML IJ SOLN: 25.0000 ug | INTRAMUSCULAR | Status: DC | PRN

## 2024-09-29 MED ORDER — ONDANSETRON HCL 4 MG PO TABS: 4.0000 mg | ORAL_TABLET | Freq: Four times a day (QID) | ORAL | Status: DC | PRN

## 2024-09-29 MED ORDER — ACETAMINOPHEN 325 MG PO TABS
650.0000 mg | ORAL_TABLET | Freq: Four times a day (QID) | ORAL | Status: DC | PRN
Start: 2024-09-29 — End: 2024-09-30

## 2024-09-29 MED ORDER — IOHEXOL 300 MG/ML  SOLN
100.0000 mL | Freq: Once | INTRAMUSCULAR | Status: AC | PRN
  Administered 2024-09-29: 100 mL via INTRAVENOUS

## 2024-09-29 MED ORDER — PROPOFOL 10 MG/ML IV BOLUS
INTRAVENOUS | Status: AC
Start: 2024-09-29 — End: 2024-09-29
  Filled 2024-09-29: qty 20

## 2024-09-29 MED ORDER — FENTANYL CITRATE (PF) 100 MCG/2ML IJ SOLN
INTRAMUSCULAR | Status: DC | PRN
  Administered 2024-09-29 (×2): 25 ug via INTRAVENOUS

## 2024-09-29 SURGICAL SUPPLY — 41 items
CHLORAPREP W/TINT 26 (MISCELLANEOUS) IMPLANT
DRAPE LAPAROTOMY 100X77 ABD (DRAPES) ×2 IMPLANT
DRAPE WARM FLUID 44X44 (DRAPES) ×2 IMPLANT
DRSG OPSITE POSTOP 4X10 (GAUZE/BANDAGES/DRESSINGS) IMPLANT
DRSG OPSITE POSTOP 4X8 (GAUZE/BANDAGES/DRESSINGS) IMPLANT
ELECT BLADE 6.5 EXT (BLADE) ×2 IMPLANT
ELECTRODE REM PT RTRN 9FT ADLT (ELECTROSURGICAL) ×2 IMPLANT
EXCISOR BIOPSY CONE MED FISHER (MISCELLANEOUS) IMPLANT
GLOVE BIO SURGEON STRL SZ 6.5 (GLOVE) ×2 IMPLANT
GLOVE BIOGEL PI IND STRL 6.5 (GLOVE) ×2 IMPLANT
GLOVE SURG SYN 6.5 PF PI (GLOVE) ×4 IMPLANT
GOWN STRL REUS W/ TWL LRG LVL3 (GOWN DISPOSABLE) ×6 IMPLANT
HANDLE SUCTION POOLE (INSTRUMENTS) IMPLANT
KIT PREVENA INCISION MGT20CM45 (CANNISTER) IMPLANT
KIT TURNOVER KIT A (KITS) ×2 IMPLANT
LABEL OR SOLS (LABEL) ×2 IMPLANT
LIGASURE IMPACT 36 18CM CVD LR (INSTRUMENTS) IMPLANT
MANIFOLD NEPTUNE II (INSTRUMENTS) ×2 IMPLANT
NDL HYPO 22X1.5 SAFETY MO (MISCELLANEOUS) ×2 IMPLANT
NEEDLE HYPO 22X1.5 SAFETY MO (MISCELLANEOUS) ×2 IMPLANT
PACK BASIN MAJOR ARMC (MISCELLANEOUS) ×2 IMPLANT
PACK COLON CLEAN CLOSURE (MISCELLANEOUS) ×2 IMPLANT
RELOAD STAPLE 40 BLU REG (ENDOMECHANICALS) IMPLANT
RELOAD STAPLE 75 3.8 BLU REG (ENDOMECHANICALS) IMPLANT
RETRACTOR WND ALEXIS-O 25 LRG (MISCELLANEOUS) IMPLANT
RTRCTR C-SECT PINK 34CM XLRG (MISCELLANEOUS) IMPLANT
SOLN 0.9% NACL POUR BTL 1000ML (IV SOLUTION) ×2 IMPLANT
SPONGE T-LAP 18X18 ~~LOC~~+RFID (SPONGE) IMPLANT
STAPLER CVD CUT BLU 40 RELOAD (ENDOMECHANICALS) IMPLANT
STAPLER GUN LINEAR PROX 60 (STAPLE) IMPLANT
STAPLER PROXIMATE 75MM BLUE (STAPLE) IMPLANT
STAPLER SKIN PROX 35W (STAPLE) ×2 IMPLANT
SUT PDS AB 0 CT1 27 (SUTURE) IMPLANT
SUT SILK 2-0 18XBRD TIE 12 (SUTURE) IMPLANT
SUT STRATAFIX PDS 30 CT-1 (SUTURE) IMPLANT
SUT VIC AB 2-0 SH 27XBRD (SUTURE) ×2 IMPLANT
SUT VIC AB 3-0 SH 27X BRD (SUTURE) ×2 IMPLANT
SYR 20ML LL LF (SYRINGE) ×4 IMPLANT
TRAP FLUID SMOKE EVACUATOR (MISCELLANEOUS) ×2 IMPLANT
TRAY FOLEY MTR SLVR 16FR STAT (SET/KITS/TRAYS/PACK) ×2 IMPLANT
WATER STERILE IRR 500ML POUR (IV SOLUTION) ×2 IMPLANT

## 2024-09-29 NOTE — Anesthesia Procedure Notes (Signed)
 Procedure Name: Intubation Date/Time: 09/29/2024 1:17 PM  Performed by: Landy Francena BIRCH, CRNAPre-anesthesia Checklist: Patient identified, Emergency Drugs available, Suction available and Patient being monitored Patient Re-evaluated:Patient Re-evaluated prior to induction Oxygen Delivery Method: Circle system utilized Preoxygenation: Pre-oxygenation with 100% oxygen Induction Type: IV induction, Rapid sequence and Cricoid Pressure applied Laryngoscope Size: McGrath and 3 Grade View: Grade I Tube type: Oral Tube size: 7.0 mm Number of attempts: 1 Airway Equipment and Method: Stylet, Oral airway and Bite block Placement Confirmation: ETT inserted through vocal cords under direct vision, positive ETCO2 and breath sounds checked- equal and bilateral Secured at: 20 cm Tube secured with: Tape Dental Injury: Teeth and Oropharynx as per pre-operative assessment

## 2024-09-29 NOTE — ED Triage Notes (Signed)
 Pt to ED with c/o abdominal bloating since yesterday. Last BM was yesterday, reports was normal. Pt attempted to take laxatives and did an enema without relief. Pain currently 3/10.

## 2024-09-29 NOTE — Op Note (Signed)
 Preoperative diagnosis: Volvulus of cecum with ischemia  Postoperative diagnosis: Same  Procedure: Right hemicolectomy with primary anastomosis.   Anesthesia: GETA  Surgeon: Dr. Rodolph, MD  Wound Classification: Clean Contaminated  Indications: Patient is a 78 y.o. female with symptoms of acute abdominal distention with severe lower abdominal pain. Resection was needed due to ischemia of the cecum.   Findings: 1. Volvulus of cecum with gangrene 2. Tension free anastomosis 3. Adequate gross circulation on anastomosis 4. Adequate hemostasis       Description of procedure:  The patient was placed in the supine position and general endotracheal anesthesia was induced. Sequential pneumatic compression devices were placed on lower extremities. Preoperative antibiotics were given. A Foley catheter and nasogastric tube were placed. The abdomen was prepped and draped in the usual sterile fashion. A time-out was completed verifying correct patient, procedure, site, positioning, and implant(s) and/or special equipment prior to beginning this procedure.  A vertical midline incision was made from xiphoid to just above the pubis. This was deepened through the subcutaneous tissues and hemostasis was achieved with electrocautery. The linea alba was identified and incised and the peritoneal cavity entered.  The abdomen was explored. Cecum found with gangrene due to acute volvulus.   The small bowel was inspected and retracted to the left using a moist gauze. Using electrocautery, the colon was freed from its peritoneal attachments along the avascular line of Toldt from the cecum to the hepatic flexure. Additional lateral peritoneal coverings were incised to further mobilize the colon. The dissection was extended across the ileocolic junction and terminal ileum was mobilized. Both ureters were identified and protected, as were the duodenum, right kidney, and gonadal vessels. The hepatic flexure was  carefully mobilized by dividing the peritoneum in the hepatorenal fossa.  Points of transection were selected proximally and distally. The bowel was divided with the linear cutting stapler. The peritoneum overlying the mesentery was then scored with electrocautery and then ligated and divided with LigaSure. The specimen was removed. Hemostasis was checked in the operative field. The two ends of bowel were checked and found to be viable, with excellent blood supply. The proximal and distal segments were brought into apposition and found to lie comfortably next to each other. Two stay sutures of 3-0 vicryl were placed to approximate the antimesenteric borders of the bowel segments. Enterotomies were made on the antimesenteric corner of the staple line on the ileum and transverse colon and the linear cutting stapler inserted and fired. Hemostasis was checked on the staple line. The enterotomies were then closed with a linear stapler. The anastomosis was checked and found to be intact and widely patent. Mesenteric defect was closed with interrupted 3-0 Vicryl. The abdominal cavity was then copiously irrigated and hemostasis was checked.  Using clean closure technique, the fascia was closed with a running suture of PDS 0. The skin was closed with subcuticular sutures of Monocryl 3-0. A negative pressure dressing (DME) was applied covering an area of 20 x 2 cm (40 sq cm) The patient tolerated the procedure well and was taken to the postanesthesia care unit in stable condition.   Specimen: Right colon  Complications: None  Estimated Blood Loss: 100 mL

## 2024-09-29 NOTE — Plan of Care (Signed)
  Problem: Education: Goal: Knowledge of General Education information will improve Description: Including pain rating scale, medication(s)/side effects and non-pharmacologic comfort measures Outcome: Progressing   Problem: Health Behavior/Discharge Planning: Goal: Ability to manage health-related needs will improve Outcome: Progressing   Problem: Clinical Measurements: Goal: Ability to maintain clinical measurements within normal limits will improve Outcome: Progressing Goal: Will remain free from infection Outcome: Progressing Goal: Diagnostic test results will improve Outcome: Progressing Goal: Respiratory complications will improve Outcome: Progressing Goal: Cardiovascular complication will be avoided Outcome: Progressing   Problem: Activity: Goal: Risk for activity intolerance will decrease Outcome: Progressing   Problem: Pain Managment: Goal: General experience of comfort will improve and/or be controlled Outcome: Progressing   Problem: Safety: Goal: Ability to remain free from injury will improve Outcome: Progressing

## 2024-09-29 NOTE — Progress Notes (Signed)
 PHARMACIST - PHYSICIAN COMMUNICATION  CONCERNING:  Enoxaparin (Lovenox) for DVT Prophylaxis    RECOMMENDATION: Patient was prescribed enoxaprin 30mg  q24 hours for VTE prophylaxis.   Filed Weights   09/29/24 1200  Weight: 51.8 kg (114 lb 1.6 oz)    Body mass index is 19.59 kg/m.  Estimated Creatinine Clearance: 45.7 mL/min (by C-G formula based on SCr of 0.83 mg/dL).   Patient is candidate for enoxaparin 40mg  every 24 hours based on CrCl >33ml/min or Weight >45kg  DESCRIPTION: Pharmacy has adjusted enoxaparin dose per [facility] policy.  Patient is now receiving enoxaparin 40 mg every 24 hours   Estill CHRISTELLA Lutes, PharmD, BCPS Clinical Pharmacist 09/29/2024 12:20 PM

## 2024-09-29 NOTE — ED Provider Notes (Signed)
-----------------------------------------   8:45 AM on 09/29/2024 -----------------------------------------  Blood pressure (!) 172/97, pulse 61, temperature 98 F (36.7 C), resp. rate 16, SpO2 99%.  Assuming care from Dr. Gordan.  In short, Anna Castaneda is a 78 y.o. female with a chief complaint of abdominal bleeding and discomfort.  Refer to the original H&P for additional details.  The current plan of care is to await CT and determine safe disposition.  CT imaging demonstrating concern of possible small bowel obstruction with concern of a underlying volvulus, discussed with general surgery, Dr. Cesar who will come to evaluate the patient, requesting admission to medicine service as well as placement of an NG tube - 9153.  [phone] -spoke with Dr. Laurita from medicine team who has agreed to evaluate the patient to determine course of further medical management.   Clinical Course as of 09/29/24 0845  Sun Sep 29, 2024  0701 Labs notable for a lipase of 72 which is of unclear clinical significance given no specific abdominal pain and no tenderness to palpation.  Her sodium is 123 which seems to be relatively new although she has had some less significant hyponatremia in the past.  I will give a liter of LR so as not to correct her too rapidly.  CBC is reassuring.  I am proceeding with CT of the abdomen and pelvis with oral contrast if she can tolerate it given the probability of obstruction. [CF]  9296 Transferring ED care to Dr. Fernand  to follow-up and reassess the patient. [CF]    Clinical Course User Index [CF] Gordan Huxley, MD     Medications  lactated ringers bolus 1,000 mL (1,000 mLs Intravenous New Bag/Given 09/29/24 0755)  iohexol  (OMNIPAQUE ) 300 MG/ML solution 100 mL (100 mLs Intravenous Contrast Given 09/29/24 0744)     ED Discharge Orders     None      Final diagnoses:  Bloating  Abdominal distention      [name], MD 09/29/24 1232

## 2024-09-29 NOTE — Transfer of Care (Signed)
 Immediate Anesthesia Transfer of Care Note  Patient: Anna Castaneda  Procedure(s) Performed: LAPAROTOMY, EXPLORATORY (Abdomen) APPLICATION, WOUND VAC (Abdomen)  Patient Location: PACU  Anesthesia Type:General  Level of Consciousness: drowsy  Airway & Oxygen Therapy: Patient Spontanous Breathing and Patient connected to face mask oxygen  Post-op Assessment: Report given to RN, Post -op Vital signs reviewed and stable, and Patient moving all extremities  Post vital signs: Reviewed and stable  Last Vitals:  Vitals Value Taken Time  BP 136/73 09/29/24 15:45  Temp 36.1 C 09/29/24 15:45  Pulse 70 09/29/24 15:52  Resp 15 09/29/24 15:52  SpO2 100 % 09/29/24 15:52  Vitals shown include unfiled device data.  Last Pain:  Vitals:   09/29/24 1545  TempSrc:   PainSc: Asleep      Patients Stated Pain Goal: 2 (09/29/24 1056)  Complications: No notable events documented.

## 2024-09-29 NOTE — Consult Note (Signed)
 SURGICAL CONSULTATION NOTE   HISTORY OF PRESENT ILLNESS (HPI):  78 y.o. female presented to [facility] ED for evaluation of sudden onset of abdominal distention since yesterday. Patient reports she was doing well until yesterday when she was starting getting distended.  Distention was causing abdominal pain.  She thought that it she was constipated so she took some laxative and tried an enema without any relief.  She actually endorses that she has been getting more distended with time.  The abdominal pain is generalized.  No pain radiation.  Patient cannot identify any alleviating or aggravating factors.  At the ED she was found with stable vital signs.  Abdominal exam shows severe abdominal distention with tenderness on palpation.  There is no leukocytosis and no significant electrolyte disturbance.  Normal renal function.  She had a CT scan of the abdomen and pelvis that shows severe dilation of the cecum with concern of volvulus.  She also had severe stomach dilation.  NGT was placed.  I personally evaluated the images.  Surgery is consulted by Dr. 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  PAST MEDICAL HISTORY (PMH):  Past Medical History:  Diagnosis Date   Arthritis 2018   GERD (gastroesophageal reflux disease) 2005   Hyperlipidemia    Hypertension    Myocardial infarction (HCC) 02/01/2016   Broken Heart Syndrome   Osteoporosis 2018     PAST SURGICAL HISTORY (PSH):  Past Surgical History:  Procedure Laterality Date   CATARACT EXTRACTION Bilateral 2004   COLONOSCOPY WITH PROPOFOL  N/A 09/02/2019   Procedure: COLONOSCOPY WITH PROPOFOL ;  Surgeon: Toledo, Ladell POUR, MD;  Location: [facility] ENDOSCOPY;  Service: Gastroenterology;  Laterality: N/A;   TONSILLECTOMY Bilateral 1954   TUBAL LIGATION  1980     MEDICATIONS:  Prior to Admission medications   Medication Sig Start Date End Date Taking? Authorizing Provider  aluminum hydroxide-magnesium carbonate (GAVISCON)  95-358 mg/15 mL SUSP Take by mouth every evening. nightly   Yes [provider]  Ascorbic Acid (VITAMIN C) 1000 MG tablet Take 1,000 mg by mouth daily.   Yes [provider]  atorvastatin  (LIPITOR) 20 MG tablet Take 20 mg by mouth daily.   Yes [provider]  CALCIUM  LACTATE PO Take by mouth.   Yes [provider]  cetirizine (ZYRTEC) 10 MG tablet Take 10 mg by mouth daily as needed for allergies.    Yes [provider]  Cholecalciferol (VITAMIN D3) 50 MCG (2000 UT) capsule Take 2,000 Units by mouth daily.   Yes [provider]  clobetasol cream (TEMOVATE) 0.05 % Apply 1 Application topically 2 (two) times daily. 09/23/24  Yes [provider]  cyanocobalamin (VITAMIN B12) 500 MCG tablet Take 1,000 mcg by mouth daily.   Yes [provider]  FERROUS FUM-IRON POLYSACCH PO Take by mouth. Iron fum, poly #1-vitC-L.casei 130 mg iron-25 mg-30 mg Cap   Yes [provider]  ketoconazole (NIZORAL) 2 % cream Apply 1 Application topically 2 (two) times daily. 10/05/23  Yes [provider]  lisinopril (ZESTRIL) 10 MG tablet Take 10 mg by mouth daily. 10/23/23  Yes [provider]  multivitamin-lutein (OCUVITE-LUTEIN) CAPS capsule Take 1 capsule by mouth 2 (two) times daily.   Yes [provider]  niacinamide 500 MG tablet Take 500 mg by mouth daily.   Yes [provider]  pantoprazole  (PROTONIX ) 20 MG tablet Take 20 mg by mouth daily.   Yes [provider]  pimecrolimus (ELIDEL) 1 % cream  Apply 1 Application topically 2 (two) times daily.   Yes [provider]  potassium chloride  (KLOR-CON ) 20 MEQ packet Take 40 mEq by mouth daily for 10 days. 11/15/23 09/29/24 Yes Darci Pore, MD  protein supplement (RESOURCE BENEPROTEIN) POWD Take 2 Scoops by mouth 2 (two) times daily.   Yes [provider]  ALPHA-D-GALACTOSIDASE PO Take 300 Units by mouth.    [provider]  aspirin  EC 81 MG tablet Take 81 mg by mouth daily.    [provider]  Biotin  5 MG TABS Take 1 tablet (5 mg total) by mouth daily. 11/14/23   Darci Pore, MD  bismuth subsalicylate (PEPTO BISMOL) 262 MG/15ML suspension Take by mouth.    [provider]  co-enzyme Q-10 50 MG capsule Take 200 mg by mouth daily. Ubidecarenone (Co-Enzyme Q-10, Ubiquinone) 200 tablet    [provider]  denosumab (PROLIA) 60 MG/ML SOSY injection Inject 60 mg into the skin every 6 (six) months.    [provider]  Multiple Vitamin (MULTIVITAMIN PO) Take by mouth. Multivitamin with B complex-vitamin C (FARBEE WITH C) tablet    [provider]  NON FORMULARY Take by mouth gelatin sponge, absorb/porcine (Gelatine Absorbable MISC)    [provider]  NON FORMULARY Take by mouth C, E, zinc, copper 11/omega3s/lut (OCUVITE ADULT50+ ORAL)    [provider]  NON FORMULARY Benefiber, Guar gum, Oral    [provider]  NON FORMULARY Calm with Calcium     [provider]  NON FORMULARY CocoaVia    [provider]  saline (AYR) GEL Place 1 Application into both nostrils every 4 (four) hours as needed. 08/23/23   Abdul Fine, MD  SIMETHICONE PO Take 1 tablet by mouth every 6 (six) hours as needed (flutulence). GAS-X ORAL)     [provider]     ALLERGIES:  Allergies  Allergen Reactions   Lactose Diarrhea   Lactose Intolerance (Gi) Diarrhea     SOCIAL HISTORY:  Social History   Socioeconomic History   Marital status: Widowed    Spouse name: Not on file   Number of children: Not on file   Years of education: Not on file   Highest education level: Bachelor's degree (e.g., BA, AB, BS)  Occupational History   Not on file  Tobacco Use   Smoking status: Former    Current packs/day: 0.00    Average packs/day: 1 pack/day for 15.0 years (15.0 ttl pk-yrs)    Types: Cigarettes    Start date:  04/23/1974    Quit date: 04/23/1989    Years since quitting: 35.4   Smokeless tobacco: Never   Tobacco comments:    smoker: 07/03/1974 - 04/23/1989  Vaping Use   Vaping status: Never Used  Substance and Sexual Activity   Alcohol use: Yes    Alcohol/week: 7.0 standard drinks of alcohol    Types: 7 Glasses of wine per week   Drug use: Never   Sexual activity: Not Currently  Other Topics Concern   Not on file  Social History Narrative   Not on file   Social Drivers of Health   Financial Resource Strain: Low Risk  (06/07/2024)   Received from [facility]   Overall Financial Resource Strain (CARDIA)    Difficulty of Paying Living Expenses: Not hard at all  Food Insecurity: No Food Insecurity (09/29/2024)   Hunger Vital Sign    Worried About Running Out of Food in the Last Year: Never  true    Ran Out of Food in the Last Year: Never true  Transportation Needs: No Transportation Needs (09/29/2024)   PRAPARE - Administrator, Civil Service (Medical): No    Lack of Transportation (Non-Medical): No  Physical Activity: Sufficiently Active (08/22/2023)   Exercise Vital Sign    Days of Exercise per Week: 7 days    Minutes of Exercise per Session: 60 min  Stress: No Stress Concern Present (08/22/2023)   [employer] of Occupational Health - Occupational Stress Questionnaire    Feeling of Stress : Not at all  Social Connections: Moderately Integrated (09/29/2024)   Social Connection and Isolation Panel    Frequency of Communication with Friends and Family: More than three times a week    Frequency of Social Gatherings with Friends and Family: More than three times a week    Attends Religious Services: 1 to 4 times per year    Active Member of [employer] or Organizations: Yes    Attends Banker Meetings: More than 4 times per year    Marital Status: Widowed  Intimate Partner Violence: Not At Risk (09/29/2024)   Humiliation, Afraid, Rape, and Kick  questionnaire    Fear of Current or Ex-Partner: No    Emotionally Abused: No    Physically Abused: No    Sexually Abused: No      FAMILY HISTORY:  Family History  Problem Relation Age of Onset   Breast cancer Neg Hx      REVIEW OF SYSTEMS:  Constitutional: denies weight loss, fever, chills, or sweats  Eyes: denies any other vision changes, history of eye injury  ENT: denies sore throat, hearing problems  Respiratory: denies shortness of breath, wheezing  Cardiovascular: denies chest pain, palpitations  Gastrointestinal: Positive abdominal pain, nausea and vomiting Genitourinary: denies burning with urination or urinary frequency Musculoskeletal: denies any other joint pains or cramps  Skin: denies any other rashes or skin discolorations  Neurological: denies any other headache, dizziness, weakness  Psychiatric: denies any other depression, anxiety   All other review of systems were negative   VITAL SIGNS:  Temp:  [98 F (36.7 C)-98.1 F (36.7 C)] 98.1 F (36.7 C) (11/16 1102) Pulse Rate:  [61-81] 81 (11/16 1102) Resp:  [16-18] 18 (11/16 1102) BP: (154-172)/(88-97) 154/88 (11/16 1102) SpO2:  [95 %-99 %] 95 % (11/16 1102)             INTAKE/OUTPUT:  This shift: No intake/output data recorded.  Last 2 shifts: @IOLAST2SHIFTS @   PHYSICAL EXAM:  Constitutional:  -- Normal body habitus  -- Awake, alert, and oriented x3  Eyes:  -- Pupils equally round and reactive to light  -- No scleral icterus  Ear, nose, and throat:  -- No jugular venous distension  Pulmonary:  -- No crackles  -- Equal breath sounds bilaterally -- Breathing non-labored at rest Cardiovascular:  -- S1, S2 present  -- No pericardial rubs Gastrointestinal:  -- Abdomen soft, generalized tenderness to palpation, severely distended distended, with guarding and rebound tenderness -- No abdominal masses appreciated, pulsatile or otherwise  Musculoskeletal and Integumentary:  -- Wounds: None  appreciated -- Extremities: B/L UE and LE FROM, hands and feet warm, no edema  Neurologic:  -- Motor function: intact and symmetric -- Sensation: intact and symmetric   Labs:     Latest Ref Rng & Units 09/29/2024    6:21 AM 11/14/2023    6:11 AM 11/13/2023    5:11 AM  CBC  WBC 4.0 - 10.5 K/uL 7.4  6.1  7.6   Hemoglobin 12.0 - 15.0 g/dL 86.1  85.7  86.5   Hematocrit 36.0 - 46.0 % 39.1  40.8  37.8   Platelets 150 - 400 K/uL 353  323  302       Latest Ref Rng & Units 09/29/2024    6:21 AM 11/14/2023    6:11 AM 11/13/2023    2:28 PM  CMP  Glucose 70 - 99 mg/dL 801  96    BUN 8 - 23 mg/dL 12  10    Creatinine 9.55 - 1.00 mg/dL 9.16  9.45    Sodium 864 - 145 mmol/L 123  132  127   Potassium 3.5 - 5.1 mmol/L 4.9  3.2    Chloride 98 - 111 mmol/L 86  98    CO2 22 - 32 mmol/L 23  26    Calcium  8.9 - 10.3 mg/dL 9.3  8.2    Total Protein 6.5 - 8.1 g/dL 7.2  6.4    Total Bilirubin 0.0 - 1.2 mg/dL 0.5  0.8    Alkaline Phos 38 - 126 U/L 44  33    AST 15 - 41 U/L 34  84    ALT 0 - 44 U/L 17  42      Imaging studies:  EXAM: CT ABDOMEN AND PELVIS WITH CONTRAST 09/29/2024 07:57:24 AM   TECHNIQUE: CT of the abdomen and pelvis was performed with the administration of 100 mL of iohexol  (OMNIPAQUE ) 300 MG/ML solution. Multiplanar reformatted images are provided for review. Automated exposure control, iterative reconstruction, and/or weight-based adjustment of the mA/kV was utilized to reduce the radiation dose to as low as reasonably achievable.   COMPARISON: None available.   CLINICAL HISTORY: Bowel obstruction suspected.   FINDINGS:   LOWER CHEST: No acute abnormality.   LIVER: The liver is unremarkable.   GALLBLADDER AND BILE DUCTS: The gallbladder is either contracted or absent. No biliary ductal dilatation.   SPLEEN: The spleen is diminutive.   PANCREAS: No acute abnormality.   ADRENAL GLANDS: No acute abnormality.   KIDNEYS, URETERS AND BLADDER: There are  small simple cysts arising from the superior pole of the left kidney, which is atrophic. Per consensus, no follow-up is needed for simple Bosniak type 1 and 2 renal cysts, unless the patient has a malignancy history or risk factors. No stones in the kidneys or ureters. No hydronephrosis. No perinephric or periureteral stranding. Urinary bladder is unremarkable.   GI AND BOWEL: The stomach is markedly distended with fluid and air, measuring up to 13 cm in diameter. The cecum is also abnormally distended, measuring up to 9 cm in diameter. There is formed fecal material present in what appears to be the distal small bowel, suggestive of obstruction.   PERITONEUM AND RETROPERITONEUM: No ascites. No free air.   VASCULATURE: The abdominal aorta is normal in caliber and demonstrates mild-to-moderate calcific atheromatous disease.   LYMPH NODES: No lymphadenopathy.   REPRODUCTIVE ORGANS: No acute abnormality.   BONES AND SOFT TISSUES: There is mild levoscoliosis of the thoracolumbar spine. No acute osseous abnormality. No focal soft tissue abnormality.   IMPRESSION: 1. Findings concerning for bowel obstruction with markedly distended stomach (up to 13 cm) and cecum (up to 9 cm) and fecalized distal small bowel contents; recommend urgent clinical and surgical evaluation. This is concerning for an atypical appearance of cecal volvulus. Surgery consultation is recommended. 2. Possible gastric outlet obstruction. 3. 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  Patient with sudden onset of abdominal distention with CT scan with dilated ascending colon and cecum consistent with  cecal volvulus.  Physical exam with severe tenderness to palpation and distention.  Concern of ischemia of the large intestine.  Due to these findings we will need to take the patient to the OR in an emergent basis for exploratory laparotomy and possible bowel resection.  I discussed these findings and recommendation with patient.  Discussed with patient risks of bleeding, infection, injury to adjacent organ, possible need of ostomy, among others.  The patient reports she understood and agreed to proceed.   Lucas Petrin, MD

## 2024-09-29 NOTE — Anesthesia Preprocedure Evaluation (Signed)
 Anesthesia Evaluation  Patient identified by MRN, date of birth, ID band Patient awake    Reviewed: Allergy & Precautions, H&P , NPO status , Patient's Chart, lab work & pertinent test results, reviewed documented beta blocker date and time   Airway Mallampati: III  TM Distance: >3 FB Neck ROM: full    Dental  (+) Caps, Dental Advidsory Given, Teeth Intact, Missing   Pulmonary neg pulmonary ROS, former smoker   Pulmonary exam normal breath sounds clear to auscultation       Cardiovascular Exercise Tolerance: Good hypertension, (-) angina + Past MI (broken heart syndrome)  (-) Cardiac Stents Normal cardiovascular exam(-) dysrhythmias (-) Valvular Problems/Murmurs Rhythm:regular Rate:Normal     Neuro/Psych Seizures - (one time), Well Controlled,   negative psych ROS   GI/Hepatic Neg liver ROS,GERD  Controlled and Medicated,,  Endo/Other  negative endocrine ROS    Renal/GU negative Renal ROS  negative genitourinary   Musculoskeletal   Abdominal   Peds  Hematology negative hematology ROS (+)   Anesthesia Other Findings Past Medical History: 2018: Arthritis 2005: GERD (gastroesophageal reflux disease) No date: Hyperlipidemia No date: Hypertension 02/01/2016: Myocardial infarction [facility])     Comment:  Broken Heart Syndrome 2018: Osteoporosis   Reproductive/Obstetrics negative OB ROS                              Anesthesia Physical Anesthesia Plan  ASA: 2 and emergent  Anesthesia Plan: General   Post-op Pain Management:    Induction: Intravenous, Rapid sequence and Cricoid pressure planned  PONV Risk Score and Plan: 3 and Ondansetron , Dexamethasone and Treatment may vary due to age or medical condition  Airway Management Planned: Oral ETT  Additional Equipment:   Intra-op Plan:   Post-operative Plan: Extubation in OR  Informed Consent: I have reviewed the patients History and  Physical, chart, labs and discussed the procedure including the risks, benefits and alternatives for the proposed anesthesia with the patient or authorized representative who has indicated his/her understanding and acceptance.     Dental Advisory Given  Plan Discussed with: Anesthesiologist, CRNA and Surgeon  Anesthesia Plan Comments: (NG tube in place)        Anesthesia Quick Evaluation

## 2024-09-29 NOTE — H&P (Signed)
 History and Physical    Anna Castaneda FMW:[id number] DOB: [birth year] DOA: 09/29/2024  PCP: Sadie Manna, MD (Confirm with patient/family/NH records and if not entered, this has to be entered at [facility] point of entry) Patient coming from: Home  I have personally briefly reviewed patient's old medical records in [facility]  Chief Complaint: Belly hurts, feeling nausea  HPI: Anna Castaneda is a 78 y.o. female with medical history significant of HTN, HLD, CVA, chronic hyponatremia, anxiety/depression, MI, coming in with worsening of abdominal pain and feeling nausea and bloating.  Symptoms started yesterday, patient started develop intermittent of cramping-like abdominal pain on the right side of abdomen associated with constipation, last bowel movement was yesterday.  She tried laxative and enema at home but no significant improvement.  Overnight he started to feel nausea and  bloating like sensation and decided to come to ED.  No history of significant intra-abdominal surgery before.  ED Course: Afebrile, nontachycardic blood pressure 150/80 O2 saturation 95% on room air.  CT abdominal pelvis showed SBO and markedly distended stomach, suspicious for atypical cecal volvulus.  General surgeon consulted, blood work showed BUN 12 creatinine 0.8, sodium 123, glucose 198, WBC 7.4, hemoglobin 13.8.  Patient was given multiple rounds of IV medications for pain and nausea.  Review of Systems: As per HPI otherwise 14 point review of systems negative.    Past Medical History:  Diagnosis Date   Arthritis 2018   GERD (gastroesophageal reflux disease) 2005   Hyperlipidemia    Hypertension    Myocardial infarction (HCC) 02/01/2016   Broken Heart Syndrome   Osteoporosis 2018    Past Surgical History:  Procedure Laterality Date   CATARACT EXTRACTION Bilateral 2004   COLONOSCOPY WITH PROPOFOL  N/A 09/02/2019   Procedure: COLONOSCOPY WITH PROPOFOL ;  Surgeon: Toledo, Ladell POUR, MD;  Location:  [facility] ENDOSCOPY;  Service: Gastroenterology;  Laterality: N/A;   TONSILLECTOMY Bilateral 1954   TUBAL LIGATION  1980     reports that she quit smoking about 35 years ago. Her smoking use included cigarettes. She started smoking about 50 years ago. She has a 15 pack-year smoking history. She has never used smokeless tobacco. She reports current alcohol use of about 7.0 standard drinks of alcohol per week. She reports that she does not use drugs.  Allergies  Allergen Reactions   Lactose Diarrhea   Lactose Intolerance (Gi) Diarrhea    Family History  Problem Relation Age of Onset   Breast cancer Neg Hx      Prior to Admission medications   Medication Sig Start Date End Date Taking? Authorizing Provider  aluminum hydroxide-magnesium carbonate (GAVISCON) 95-358 mg/15 mL SUSP Take by mouth every evening. nightly   Yes [provider]  Ascorbic Acid (VITAMIN C) 1000 MG tablet Take 1,000 mg by mouth daily.   Yes [provider]  atorvastatin  (LIPITOR) 20 MG tablet Take 20 mg by mouth daily.   Yes [provider]  CALCIUM  LACTATE PO Take by mouth.   Yes [provider]  cetirizine (ZYRTEC) 10 MG tablet Take 10 mg by mouth daily as needed for allergies.    Yes [provider]  Cholecalciferol (VITAMIN D3) 50 MCG (2000 UT) capsule Take 2,000 Units by mouth daily.   Yes [provider]  clobetasol cream (TEMOVATE) 0.05 % Apply 1 Application topically 2 (two) times daily. 09/23/24  Yes [provider]  cyanocobalamin (VITAMIN B12) 500 MCG tablet Take 1,000 mcg by mouth daily.   Yes [provider]  FERROUS FUM-IRON POLYSACCH PO Take by mouth. Iron fum, poly #1-vitC-L.casei 130 mg iron-25 mg-30 mg Cap   Yes [provider]  ketoconazole (NIZORAL) 2 % cream Apply 1 Application topically 2 (two) times daily. 10/05/23  Yes [provider]  lisinopril (ZESTRIL) 10 MG tablet Take 10 mg by mouth daily. 10/23/23  Yes  [provider]  multivitamin-lutein (OCUVITE-LUTEIN) CAPS capsule Take 1 capsule by mouth 2 (two) times daily.   Yes [provider]  niacinamide 500 MG tablet Take 500 mg by mouth daily.   Yes [provider]  pantoprazole  (PROTONIX ) 20 MG tablet Take 20 mg by mouth daily.   Yes [provider]  pimecrolimus (ELIDEL) 1 % cream Apply 1 Application topically 2 (two) times daily.   Yes [provider]  potassium chloride  (KLOR-CON ) 20 MEQ packet Take 40 mEq by mouth daily for 10 days. 11/15/23 09/29/24 Yes Darci Pore, MD  protein supplement (RESOURCE BENEPROTEIN) POWD Take 2 Scoops by mouth 2 (two) times daily.   Yes [provider]  ALPHA-D-GALACTOSIDASE PO Take 300 Units by mouth.    [provider]  aspirin  EC 81 MG tablet Take 81 mg by mouth daily.    [provider]  Biotin  5 MG TABS Take 1 tablet (5 mg total) by mouth daily. 11/14/23   Darci Pore, MD  bismuth subsalicylate (PEPTO BISMOL) 262 MG/15ML suspension Take by mouth.    [provider]  co-enzyme Q-10 50 MG capsule Take 200 mg by mouth daily. Ubidecarenone (Co-Enzyme Q-10, Ubiquinone) 200 tablet    [provider]  denosumab (PROLIA) 60 MG/ML SOSY injection Inject 60 mg into the skin every 6 (six) months.    [provider]  Multiple Vitamin (MULTIVITAMIN PO) Take by mouth. Multivitamin with B complex-vitamin C (FARBEE WITH C) tablet    [provider]  NON FORMULARY Take by mouth gelatin sponge, absorb/porcine (Gelatine Absorbable MISC)    [provider]  NON FORMULARY Take by mouth C, E, zinc, copper 11/omega3s/lut (OCUVITE ADULT50+ ORAL)    [provider]  NON FORMULARY Benefiber, Guar gum, Oral    [provider]  NON FORMULARY Calm with Calcium     [provider]  NON FORMULARY CocoaVia    [provider]  saline (AYR) GEL Place 1 Application into both  nostrils every 4 (four) hours as needed. 08/23/23   Abdul Fine, MD  SIMETHICONE PO Take 1 tablet by mouth every 6 (six) hours as needed (flutulence). GAS-X ORAL)     [provider]    Physical Exam: Vitals:   09/29/24 0554 09/29/24 1102  BP: (!) 172/97 (!) 154/88  Pulse: 61 81  Resp: 16 18  Temp: 98 F (36.7 C) 98.1 F (36.7 C)  SpO2: 99% 95%    Constitutional: NAD, calm, comfortable Vitals:   09/29/24 0554 09/29/24 1102  BP: (!) 172/97 (!) 154/88  Pulse: 61 81  Resp: 16 18  Temp: 98 F (36.7 C) 98.1 F (36.7 C)  SpO2: 99% 95%   Eyes: PERRL, lids and conjunctivae normal ENMT: Mucous membranes are dry. Posterior pharynx clear of any exudate or lesions.Normal dentition.  Neck: normal, supple, no masses, no thyromegaly Respiratory: clear to auscultation bilaterally, no wheezing, no crackles. Normal respiratory effort. No accessory muscle use.  Cardiovascular: Regular rate and rhythm, no murmurs / rubs / gallops. No extremity edema. 2+ pedal pulses. No carotid bruits.  Abdomen: Distended, tenderness on periumbilical area, no rebound no guarding, no  masses palpated. No hepatosplenomegaly. Bowel sounds positive.  Musculoskeletal: no clubbing / cyanosis. No joint deformity upper and lower extremities. Good ROM, no contractures. Normal muscle tone.  Skin: no rashes, lesions, ulcers. No induration Neurologic: CN 2-12 grossly intact. Sensation intact, DTR normal. Strength 5/5 in all 4.  Psychiatric: Normal judgment and insight. Alert and oriented x 3. Normal mood.     Labs on Admission: I have personally reviewed following labs and imaging studies  CBC: Recent Labs  Lab 09/29/24 0621  WBC 7.4  NEUTROABS 6.2  HGB 13.8  HCT 39.1  MCV 90.1  PLT 353   Basic Metabolic Panel: Recent Labs  Lab 09/29/24 0621  NA 123*  K 4.9  CL 86*  CO2 23  GLUCOSE 198*  BUN 12  CREATININE 0.83  CALCIUM  9.3   GFR: CrCl cannot be calculated (Unknown ideal  weight.). Liver Function Tests: Recent Labs  Lab 09/29/24 0621  AST 34  ALT 17  ALKPHOS 44  BILITOT 0.5  PROT 7.2  ALBUMIN 4.4   Recent Labs  Lab 09/29/24 0621  LIPASE 72*   No results for input(s): AMMONIA in the last 168 hours. Coagulation Profile: No results for input(s): INR, PROTIME in the last 168 hours. Cardiac Enzymes: No results for input(s): CKTOTAL, CKMB, CKMBINDEX, TROPONINI in the last 168 hours. BNP (last 3 results) No results for input(s): PROBNP in the last 8760 hours. HbA1C: No results for input(s): HGBA1C in the last 72 hours. CBG: No results for input(s): GLUCAP in the last 168 hours. Lipid Profile: No results for input(s): CHOL, HDL, LDLCALC, TRIG, CHOLHDL, LDLDIRECT in the last 72 hours. Thyroid Function Tests: No results for input(s): TSH, T4TOTAL, FREET4, T3FREE, THYROIDAB in the last 72 hours. Anemia Panel: No results for input(s): VITAMINB12, FOLATE, FERRITIN, TIBC, IRON, RETICCTPCT in the last 72 hours. Urine analysis:    Component Value Date/Time   COLORURINE STRAW (A) 11/11/2023 1615   APPEARANCEUR CLEAR (A) 11/11/2023 1615   LABSPEC 1.006 11/11/2023 1615   PHURINE 8.0 11/11/2023 1615   GLUCOSEU NEGATIVE 11/11/2023 1615   HGBUR MODERATE (A) 11/11/2023 1615   BILIRUBINUR NEGATIVE 11/11/2023 1615   KETONESUR 5 (A) 11/11/2023 1615   PROTEINUR 100 (A) 11/11/2023 1615   NITRITE NEGATIVE 11/11/2023 1615   LEUKOCYTESUR NEGATIVE 11/11/2023 1615    Radiological Exams on Admission: DG Abd Portable 1 View Result Date: 09/29/2024 CLINICAL DATA:  NG tube placement. EXAM: PORTABLE ABDOMEN - 1 VIEW COMPARISON:  11/03/2023, CT today. FINDINGS: Elevation of the left hemidiaphragm with moderate gastric distention unchanged. Nasogastric tube demonstrates a curvilinear course over the mediastinum right of midline with tip over the level of the medial right lung base. The NG tube tip may be within  patient's known hiatal hernia, although endobronchial placement is possible. Mild cardiomegaly. Lung bases are clear. Multiple air-filled bowel loops incompletely evaluated but without significant change compared to recent CT. IMPRESSION: 1. Nasogastric tube with a curvilinear course over the mediastinum right of midline with tip over the medial right lung base. The NG tube tip may be within patient's known hiatal hernia, although endobronchial placement is possible. Recommend repositioning. 2. Stable elevation of the left hemidiaphragm with moderate gastric distention. These results were called by telephone at the time of interpretation on 09/29/2024 at 9:46 am to provider [facility] , who verbally acknowledged these results. Electronically Signed   By: Toribio Agreste M.D.   On: 09/29/2024 09:49   CT ABDOMEN PELVIS W CONTRAST Result Date: 09/29/2024 EXAM: CT  ABDOMEN AND PELVIS WITH CONTRAST 09/29/2024 07:57:24 AM TECHNIQUE: CT of the abdomen and pelvis was performed with the administration of 100 mL of iohexol  (OMNIPAQUE ) 300 MG/ML solution. Multiplanar reformatted images are provided for review. Automated exposure control, iterative reconstruction, and/or weight-based adjustment of the mA/kV was utilized to reduce the radiation dose to as low as reasonably achievable. COMPARISON: None available. CLINICAL HISTORY: Bowel obstruction suspected. FINDINGS: LOWER CHEST: No acute abnormality. LIVER: The liver is unremarkable. GALLBLADDER AND BILE DUCTS: The gallbladder is either contracted or absent. No biliary ductal dilatation. SPLEEN: The spleen is diminutive. PANCREAS: No acute abnormality. ADRENAL GLANDS: No acute abnormality. KIDNEYS, URETERS AND BLADDER: There are small simple cysts arising from the superior pole of the left kidney, which is atrophic. Per consensus, no follow-up is needed for simple Bosniak type 1 and 2 renal cysts, unless the patient has a malignancy history or risk factors. No stones in the  kidneys or ureters. No hydronephrosis. No perinephric or periureteral stranding. Urinary bladder is unremarkable. GI AND BOWEL: The stomach is markedly distended with fluid and air, measuring up to 13 cm in diameter. The cecum is also abnormally distended, measuring up to 9 cm in diameter. There is formed fecal material present in what appears to be the distal small bowel, suggestive of obstruction. PERITONEUM AND RETROPERITONEUM: No ascites. No free air. VASCULATURE: The abdominal aorta is normal in caliber and demonstrates mild-to-moderate calcific atheromatous disease. LYMPH NODES: No lymphadenopathy. REPRODUCTIVE ORGANS: No acute abnormality. BONES AND SOFT TISSUES: There is mild levoscoliosis of the thoracolumbar spine. No acute osseous abnormality. No focal soft tissue abnormality. IMPRESSION: 1. Findings concerning for bowel obstruction with markedly distended stomach (up to 13 cm) and cecum (up to 9 cm) and fecalized distal small bowel contents; recommend urgent clinical and surgical evaluation. This is concerning for an atypical appearance of cecal volvulus. Surgery consultation is recommended. 2. Possible gastric outlet obstruction. 3. Small simple-appearing renal cysts in an atrophic left kidney; benign Bosniak I/II cysts with no follow-up imaging recommended Electronically signed by: Evalene Coho MD 09/29/2024 08:16 AM EST RP Workstation: [id number]    EKG: Ordered  Assessment/Plan Principal Problem:   SBO (small bowel obstruction) (HCC)  (please populate well all problems here in Problem List. (For example, if patient is on BP meds at home and you resume or decide to hold them, it is a problem that needs to be her. Same for CAD, COPD, HLD and so on)   Acute SBO, rule out cecum volvulus - NGT in, continue intermittent suction - N.p.o., IV fluid -Supportive care with pain medications and-as needed Zofran  - GI prophylaxis with IV PPI  Hyponatremia, acute on chronic - Hypovolemic,  likely secondary to SBO - Clinically patient looks volume contracted, will continue maintenance IV fluid - Recheck sodium level this afternoon and tonight. - TSH is within normal limits. Will check hyponatremia study.  HTN -Hold off p.o. home BP meds - As needed hydralazine  for now  DVT prophylaxis: Lovenox Code Status: Full Family Communication: None at bedside Disposition Plan: Patient sick with SBO requiring NGT, n.p.o. IV fluid, inpatient surgical consultation, expect more than 2 midnight hospital stay. Consults called: General Surgery Admission status: Telemetry admission   Cort ONEIDA Mana MD [facility] Pager [phone]  09/29/2024, 11:07 AM

## 2024-09-29 NOTE — Plan of Care (Signed)
   Problem: Health Behavior/Discharge Planning: Goal: Ability to manage health-related needs will improve Outcome: Progressing

## 2024-09-29 NOTE — ED Provider Notes (Signed)
 [facility] Provider Note    Event Date/Time   First MD Initiated Contact with Patient 09/29/24 [phone]     (approximate)   History   Abdominal Pain and Bloated   HPI Anna Castaneda is a 78 y.o. female who presents for evaluation of abdominal bloating and distention as well as some nausea.  She said that she does not typically suffer from significant constipation, and in fact she had a bowel movement yesterday.  However she did not have 1 today and her abdomen is abnormally distended.  She feels bloated and nauseated but has not vomited.  She says she is not really having abdominal pain, but is uncomfortable because of that distention of her abdomen.  She has not started any new medications recently and has been taking the medications prescribed to her previously.  She thinks that she has been eating and drinking okay.  When she felt bloated this morning she tried taking prune juice as well as a mild laxative that she was previously prescribed but she could not remember the name.  She was unsuccessful after trying this.     Physical Exam   Triage Vital Signs: ED Triage Vitals [09/29/24 0554]  Encounter Vitals Group     BP (!) 172/97     Girls Systolic BP Percentile      Girls Diastolic BP Percentile      Boys Systolic BP Percentile      Boys Diastolic BP Percentile      Pulse Rate 61     Resp 16     Temp 98 F (36.7 C)     Temp src      SpO2 99 %     Weight      Height      Head Circumference      Peak Flow      Pain Score      Pain Loc      Pain Education      Exclude from Growth Chart     Most recent vital signs: Vitals:   09/29/24 0554  BP: (!) 172/97  Pulse: 61  Resp: 16  Temp: 98 F (36.7 C)  SpO2: 99%    General: Awake, alert and oriented, pleasant and conversant despite appearing somewhat uncomfortable. CV:  Good peripheral perfusion.  Regular rate and rhythm. Resp:  Normal effort. Speaking easily and comfortably, no accessory  muscle usage nor intercostal retractions.   Abd:  The patient's lower abdomen is firm and protuberant.  Despite the firmness upon palpation, she has no tenderness, she said it just feels uncomfortable.  There is no epigastric or right upper quadrant tenderness.  She is not guarding.   ED Results / Procedures / Treatments   Labs (all labs ordered are listed, but only abnormal results are displayed) Labs Reviewed  COMPREHENSIVE METABOLIC PANEL WITH GFR - Abnormal; Notable for the following components:      Result Value   Sodium 123 (*)    Chloride 86 (*)    Glucose, Bld 198 (*)    All other components within normal limits  LIPASE, BLOOD - Abnormal; Notable for the following components:   Lipase 72 (*)    All other components within normal limits  CBC WITH DIFFERENTIAL/PLATELET     RADIOLOGY ED course for details   PROCEDURES:  Critical Care performed: No  Procedures    IMPRESSION / MDM / ASSESSMENT AND PLAN / ED COURSE  I reviewed the triage vital signs  and the nursing notes.                              Differential diagnosis includes, but is not limited to, SBO, ileus, neoplasm, volvulus, electrolyte abnormality such as hypokalemia.  Patient's presentation is most consistent with acute presentation with potential threat to life or bodily function.  Labs/studies ordered: CBC with differential, CMP, lipase  Interventions/Medications given:  Medications  lactated ringers bolus 1,000 mL (has no administration in time range)  iohexol  (OMNIPAQUE ) 300 MG/ML solution 100 mL (has no administration in time range)    (Note:  hospital course my include additional interventions and/or labs/studies not listed above.)   Patient is hypertensive but otherwise has stable vital signs.  She is not having severe abdominal pain but her protuberant abdomen is worrisome for an obstructive process, either neoplastic or otherwise.  I will check basic lab work and plan for a CT scan after  verifying her renal function.  She is declining the need for any analgesia or antiemetics at this time but knows to let us  know if her clinical status changes.     Clinical Course as of 09/29/24 0732  Sun Sep 29, 2024  0701 Labs notable for a lipase of 72 which is of unclear clinical significance given no specific abdominal pain and no tenderness to palpation.  Her sodium is 123 which seems to be relatively new although she has had some less significant hyponatremia in the past.  I will give a liter of LR so as not to correct her too rapidly.  CBC is reassuring.  I am proceeding with CT of the abdomen and pelvis with oral contrast if she can tolerate it given the probability of obstruction. [CF]  9296 Transferring ED care to Dr. Fernand  to follow-up and reassess the patient. [CF]    Clinical Course User Index [CF] Gordan Huxley, MD     FINAL CLINICAL IMPRESSION(S) / ED DIAGNOSES   Final diagnoses:  Bloating  Abdominal distention     Rx / DC Orders   ED Discharge Orders     None        Note:  This document was prepared using Dragon voice recognition software and may include unintentional dictation errors.   Gordan Huxley, MD 09/29/24 [phone]

## 2024-09-30 ENCOUNTER — Encounter: Payer: Self-pay | Admitting: General Surgery

## 2024-09-30 DIAGNOSIS — K562 Volvulus: Secondary | ICD-10-CM

## 2024-09-30 DIAGNOSIS — E785 Hyperlipidemia, unspecified: Secondary | ICD-10-CM

## 2024-09-30 DIAGNOSIS — I10 Essential (primary) hypertension: Secondary | ICD-10-CM | POA: Diagnosis not present

## 2024-09-30 DIAGNOSIS — K219 Gastro-esophageal reflux disease without esophagitis: Secondary | ICD-10-CM

## 2024-09-30 DIAGNOSIS — K56609 Unspecified intestinal obstruction, unspecified as to partial versus complete obstruction: Secondary | ICD-10-CM

## 2024-09-30 DIAGNOSIS — E871 Hypo-osmolality and hyponatremia: Secondary | ICD-10-CM | POA: Diagnosis not present

## 2024-09-30 LAB — BASIC METABOLIC PANEL WITH GFR
Anion gap: 9 (ref 5–15)
BUN: 11 mg/dL (ref 8–23)
CO2: 24 mmol/L (ref 22–32)
Calcium: 8.1 mg/dL — ABNORMAL LOW (ref 8.9–10.3)
Chloride: 93 mmol/L — ABNORMAL LOW (ref 98–111)
Creatinine, Ser: 0.88 mg/dL (ref 0.44–1.00)
GFR, Estimated: >60 mL/min (ref >60)
Glucose, Bld: 108 mg/dL — ABNORMAL HIGH (ref 70–99)
Potassium: 4.7 mmol/L (ref 3.5–5.1)
Sodium: 126 mmol/L — ABNORMAL LOW (ref 135–145)

## 2024-09-30 LAB — CBC
HCT: 38.7 % (ref 36.0–46.0)
Hemoglobin: 13.6 g/dL (ref 12.0–15.0)
MCH: 31.3 pg (ref 26.0–34.0)
MCHC: 35.1 g/dL (ref 30.0–36.0)
MCV: 89 fL (ref 80.0–100.0)
Platelets: 352 K/uL (ref 150–400)
RBC: 4.35 MIL/uL (ref 3.87–5.11)
RDW: 13.9 % (ref 11.5–15.5)
WBC: 7.9 K/uL (ref 4.0–10.5)
nRBC: 0 % (ref 0.0–0.2)

## 2024-09-30 MED ORDER — ACETAMINOPHEN 10 MG/ML IV SOLN
1000.0000 mg | Freq: Four times a day (QID) | INTRAVENOUS | Status: AC
  Administered 2024-09-30 – 2024-10-01 (×4): 1000 mg via INTRAVENOUS
  Filled 2024-09-30 (×4): qty 100

## 2024-09-30 MED ORDER — SODIUM CHLORIDE 0.9 % IV SOLN: INTRAVENOUS | Status: DC

## 2024-09-30 MED ORDER — ACETAMINOPHEN 10 MG/ML IV SOLN
1000.0000 mg | Freq: Four times a day (QID) | INTRAVENOUS | Status: AC | PRN
  Filled 2024-09-30: qty 100

## 2024-09-30 NOTE — Assessment & Plan Note (Signed)
 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 Patient still no bowel function and significant bilious secretions with NG tube. General surgery is on board. - Continue with n.p.o. -Continue with IV fluid -Continue with supportive care -PT and OT evaluation

## 2024-09-30 NOTE — Hospital Course (Addendum)
 Partly taken from prior notes.   Anna Castaneda is a 78 y.o. female with medical history significant of HTN, HLD, CVA, chronic hyponatremia, anxiety/depression, MI, coming in with worsening of abdominal pain and feeling nausea and bloating.  No history of significant abdominal surgeries.  On presentation hemodynamically stable, labs pertinent for hyponatremia with sodium at 121, CT abdomen and pelvis with concern of SBO, markedly distended stomach and suspicious for atypical cecal volvulus.  General surgery was consulted, continue to get worse abdominal distention and pain despite having NG tube so she was taken to the OR s/p right hemicolectomy with primary anastomosis.  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  11/17: Vitals and hemoglobin stable, sodium at 126, seems like having chronic hyponatremia.  Hyponatremia labs with hypotonic hyponatremia, urine osmolality was normal with urine sodium of 66. Still no bowel function and NG tube with significant amount of bilious secretions.  11/18: Hemodynamically stable with improving sodium now at 131, still no bowel function.  Surgery is planning Gastrografin studies.

## 2024-09-30 NOTE — Assessment & Plan Note (Signed)
-   On IV Protonix 

## 2024-09-30 NOTE — Evaluation (Signed)
 Physical Therapy Evaluation Patient Details Name: Anna Castaneda MRN: [id number] DOB: 06/24/1946 Today's Date: 09/30/2024  History of Present Illness  Anna Castaneda is Anna 78 y.o. female with medical history significant of HTN, HLD, CVA, chronic hyponatremia, anxiety/depression, MI, coming in with worsening of abdominal pain and feeling nausea and bloating.  Clinical Impression  Patient noted to be in supine position at PT arrival in room, for an initial PT evaluation due to Anna decline in functional status, with baseline mobility reported as modI, and currently requiring supervision for hallway ambulation. The patient is Anna&O x 4, presenting with good willingness to work with PT and goals of getting back to working out with trainer. The patient resides in Anna assisted living and lives alone with family/friend support. There are no STE inside the residence. Gait was assessed with RW. Gait mechanic observations noted mild unsteadiness. The overall clinical impression is that the patient presents with mild mobility limitations secondary to post operativeright hemicolectomy. Recommended skilled PT will address safety, mobility, and discharge planning. PT recommendation to d/c patient to HHPT upon medical clearance.        If plan is discharge home, recommend the following: Anna little help with walking and/or transfers;Anna little help with bathing/dressing/bathroom   Can travel by private vehicle        Equipment Recommendations None recommended by PT  Recommendations for Other Services       Functional Status Assessment Patient has had Anna recent decline in their functional status and demonstrates the ability to make significant improvements in function in Anna reasonable and predictable amount of time.     Precautions / Restrictions Precautions Precautions: Fall Restrictions Weight Bearing Restrictions Per Provider Order: No      Mobility  Bed Mobility                    Transfers Overall  transfer level: Needs assistance Equipment used: 1 person hand held assist Transfers: Sit to/from Stand Sit to Stand: Contact guard assist                Ambulation/Gait Ambulation/Gait assistance: Contact guard assist, Supervision Gait Distance (Feet): 650 Feet Assistive device: 1 person hand held assist Gait Pattern/deviations: Step-to pattern, Drifts right/left, Trunk flexed, Narrow base of support Gait velocity: decreased     General Gait Details: mild unsteadiness  Stairs            Wheelchair Mobility     Tilt Bed    Modified Rankin (Stroke Patients Only)       Balance Overall balance assessment: Needs assistance Sitting-balance support: Feet supported Sitting balance-Leahy Scale: Normal     Standing balance support: During functional activity, Single extremity supported Standing balance-Leahy Scale: Good                               Pertinent Vitals/Pain Pain Assessment Pain Assessment: No/denies pain    Home Living Family/patient expects to be discharged to:: Assisted living Living Arrangements: Alone               Home Equipment: Agricultural Consultant (2 wheels)      Prior Function Prior Level of Function : Independent/Modified Independent             Mobility Comments: goes to personal training x3 days Anna week       Extremity/Trunk Assessment        Lower Extremity Assessment Lower Extremity Assessment: Generalized  weakness    Cervical / Trunk Assessment Cervical / Trunk Assessment: Kyphotic  Communication   Communication Communication: No apparent difficulties    Cognition Arousal: Alert Behavior During Therapy: WFL for tasks assessed/performed   PT - Cognitive impairments: No apparent impairments                         Following commands: Intact       Cueing Cueing Techniques: Verbal cues     General Comments      Exercises     Assessment/Plan    PT Assessment Patient needs  continued PT services  PT Problem List Decreased strength;Decreased activity tolerance;Decreased balance;Decreased mobility       PT Treatment Interventions DME instruction;Stair training;Functional mobility training;Therapeutic activities;Therapeutic exercise;Balance training;Neuromuscular re-education;Patient/family education    PT Goals (Current goals can be found in the Care Plan section)  Acute Rehab PT Goals Patient Stated Goal: Pt wants to get back to working out PT Goal Formulation: With patient Time For Goal Achievement: 09/30/24 Potential to Achieve Goals: Good    Frequency Min 1X/week     Co-evaluation               AM-PAC PT 6 Clicks Mobility  Outcome Measure Help needed turning from your back to your side while in Anna flat bed without using bedrails?: None Help needed moving from lying on your back to sitting on the side of Anna flat bed without using bedrails?: None Help needed moving to and from Anna bed to Anna chair (including Anna wheelchair)?: None Help needed standing up from Anna chair using your arms (e.g., wheelchair or bedside chair)?: None Help needed to walk in hospital room?: None Help needed climbing 3-5 steps with Anna railing? : Anna Little 6 Click Score: 23    End of Session   Activity Tolerance: Patient tolerated treatment well Patient left: with nursing/sitter in room Nurse Communication: Mobility status PT Visit Diagnosis: Difficulty in walking, not elsewhere classified (R26.2);Other abnormalities of gait and mobility (R26.89)    Time: 1055-1110 PT Time Calculation (min) (ACUTE ONLY): 15 min   Charges:   PT Evaluation $PT Eval Low Complexity: 1 Low   PT General Charges $$ ACUTE PT VISIT: 1 Visit         Sherlean Lesches DPT, PT    Anna Castaneda Anna Castaneda 09/30/2024, 11:27 AM

## 2024-09-30 NOTE — Assessment & Plan Note (Signed)
 Seems like acute on chronic hyponatremia.  Sodium with some improvement 126 today. -Continue with normal saline -Monitor sodium

## 2024-09-30 NOTE — Evaluation (Signed)
 Occupational Therapy Evaluation Patient Details Name: Anna Castaneda MRN: [id number] DOB: June 24, 1946 Today's Date: 09/30/2024   History of Present Illness   Anna Castaneda is 78 y.o. 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 CT scan of the abdomen and pelvis that shows severe dilation of the cecum with concern of volvulus.  She also had severe stomach dilation.  NGT was placed. 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 R hemicolectomy with primary anastomosis completed.     Clinical Impressions Pt was seen for OT evaluation this date. Prior to hospital admission, pt was active and independent, living at [place]. Pt presents with deficits in LB strength, activity tolerance, and discomfort/abdominal pain with movement/bending, affecting safe and optimal ADL completion. Pt currently requires PRN MIN A for LB ADL tasks, supv-CGA for ADL transfers and mobility. Pt edu in home/routines modifications, AE/DME to maximize safety/indep and comfort while recovering. Pt verbalized understanding. Pt would benefit from skilled OT services while hospitalized to address noted impairments and functional limitations (see below for any additional details) in order to maximize safety and independence while minimizing future risk of falls, injury, and readmission. Do not anticipate the need for follow up OT services upon acute hospital DC.    If plan is discharge home, recommend the following:   Assistance with cooking/housework;Assist for transportation;Help with stairs or ramp for entrance     Functional Status Assessment   Patient has had a recent decline in their functional status and demonstrates the ability to make significant improvements in function in a reasonable and predictable amount  of time.     Equipment Recommendations   Other (comment) (handheld shower head)      Precautions/Restrictions   Precautions Precautions: Fall;Other (comment) Recall of Precautions/Restrictions: Intact Precaution/Restrictions Comments: abd incision, NG tube Restrictions Weight Bearing Restrictions Per Provider Order: No     Mobility Bed Mobility         General bed mobility comments: NT, in recliner    Transfers Overall transfer level: Needs assistance Equipment used: 1 person hand held assist Transfers: Sit to/from Stand Sit to Stand: Contact guard assist          Balance Overall balance assessment: Needs assistance Sitting-balance support: Feet supported Sitting balance-Leahy Scale: Normal     Standing balance support: During functional activity, Single extremity supported Standing balance-Leahy Scale: Good       ADL either performed or assessed with clinical judgement   ADL Overall ADL's : Needs assistance/impaired       General ADL Comments: Pt requires PRN MIN A for LB ADL tasks 2/2 BLE weakness and discomfort/pain with bending 2/2 abd incision.      Pertinent Vitals/Pain Pain Assessment Pain Assessment: No/denies pain     Extremity/Trunk Assessment Upper Extremity Assessment Upper Extremity Assessment: Overall WFL for tasks assessed   Lower Extremity Assessment Lower Extremity Assessment: Generalized weakness   Cervical / Trunk Assessment Cervical / Trunk Assessment: Kyphotic   Communication Communication Communication: No apparent difficulties   Cognition Arousal: Alert Behavior During Therapy: WFL for tasks assessed/performed Cognition: No apparent impairments        Following commands: Intact       Cueing  General Comments   Cueing Techniques: Verbal cues      Exercises Other Exercises Other Exercises: Pt edu in home/routines modifications, AE/DME to maximize safety/indep and comfort  while recovering.   Shoulder  Instructions      Home Living Family/patient expects to be discharged to:: Other (Comment) Living Arrangements: Alone        Home Equipment: Rolling Walker (2 wheels);Shower seat;Adaptive equipment Adaptive Equipment: Reacher;Long-handled sponge Additional Comments: independent living [employer]      Prior Functioning/Environment Prior Level of Function : Independent/Modified Independent;Driving             Mobility Comments: goes to personal training x3 days a week ADLs Comments: indep    OT Problem List: Decreased strength;Pain;Decreased activity tolerance;Decreased knowledge of use of DME or AE   OT Treatment/Interventions: Self-care/ADL training;Therapeutic activities;Energy conservation;DME and/or AE instruction;Patient/family education      OT Goals(Current goals can be found in the care plan section)   Acute Rehab OT Goals Patient Stated Goal: go home and return to my personal trainer OT Goal Formulation: With patient Time For Goal Achievement: 10/14/24 Potential to Achieve Goals: Good ADL Goals Pt Will Perform Lower Body Dressing: with modified independence;sitting/lateral leans;sit to/from stand;with adaptive equipment Additional ADL Goal #1: Pt will verbalize plan to implement at least 1 learned ECS/modification for ADL/IADL to maximize safety upon return home. Additional ADL Goal #2: Pt will be mod indep with all aspects of morning ADL routine, 1/1 opportunity, incorporating learned home/routines modifications as needed.   OT Frequency:  Min 1X/week    Co-evaluation              AM-PAC OT 6 Clicks Daily Activity     Outcome Measure Help from another person eating meals?: None Help from another person taking care of personal grooming?: None Help from another person toileting, which includes using toliet, bedpan, or urinal?: A Little Help from another person bathing (including washing, rinsing, drying)?: A Little Help from another person to put on  and taking off regular upper body clothing?: None Help from another person to put on and taking off regular lower body clothing?: A Little 6 Click Score: 21   End of Session    Activity Tolerance: Patient tolerated treatment well Patient left: in chair;with call bell/phone within reach  OT Visit Diagnosis: Muscle weakness (generalized) (M62.81)                Time: 8856-8844 OT Time Calculation (min): 12 min Charges:  OT General Charges $OT Visit: 1 Visit OT Evaluation $OT Eval Low Complexity: 1 Low OT Treatments $Self Care/Home Management : 8-22 mins  Warren SAUNDERS., MPH, MS, OTR/L ascom [phone] 09/30/24, 1:13 PM

## 2024-09-30 NOTE — Assessment & Plan Note (Signed)
-   Holding home statin as she is n.p.o.

## 2024-09-30 NOTE — Progress Notes (Signed)
 [facility]- General Surgery  SURGICAL PROGRESS NOTE  Hospital Day(s): 1.   Post op day(s): 1 Day Post-Op.   Interval History:  Patient is s/p Day 1 of right hemicolectomy with primary anastomosis. Reports doing well this morning. Denies any abdominal pain, nausea or vomiting. NGT in place with recorded output 1200 in the last 12 hrs.    Vital signs in last 24 hours: [min-max] current  Temp:  [97 F (36.1 C)-98.8 F (37.1 C)] 98.8 F (37.1 C) (11/17 0340) Pulse Rate:  [70-84] 82 (11/17 0340) Resp:  [16-26] 16 (11/17 0340) BP: (107-162)/(65-88) 107/65 (11/17 0340) SpO2:  [93 %-100 %] 93 % (11/17 0340) Weight:  [51.8 kg] 51.8 kg (11/16 1200)     Height: 5' 4 (162.6 cm) Weight: 51.8 kg BMI (Calculated): 19.58   Intake/Output last 2 shifts:  11/16 0701 - 11/17 0700 In: 2662.8 [I.V.:2402.8; NG/GT:60; IV Piggyback:200] Out: 2650 [Urine:1350; Emesis/NG output:1200; Blood:100]   Physical Exam:  Constitutional: alert, cooperative and no distress  Respiratory: breathing non-labored at rest  Cardiovascular: regular rate and sinus rhythm  Gastrointestinal: soft, non-tender, and mildly distended with prevena vac in place   Labs:     Latest Ref Rng & Units 09/30/2024    4:25 AM 09/29/2024    6:21 AM 11/14/2023    6:11 AM  CBC  WBC 4.0 - 10.5 K/uL 7.9  7.4  6.1   Hemoglobin 12.0 - 15.0 g/dL 86.3  86.1  85.7   Hematocrit 36.0 - 46.0 % 38.7  39.1  40.8   Platelets 150 - 400 K/uL 352  353  323       Latest Ref Rng & Units 09/30/2024    4:25 AM 09/29/2024   10:50 PM 09/29/2024    6:07 PM  CMP  Glucose 70 - 99 mg/dL 891     BUN 8 - 23 mg/dL 11     Creatinine 9.55 - 1.00 mg/dL 9.11     Sodium 864 - 854 mmol/L 126  122  121   Potassium 3.5 - 5.1 mmol/L 4.7     Chloride 98 - 111 mmol/L 93     CO2 22 - 32 mmol/L 24     Calcium  8.9 - 10.3 mg/dL 8.1       Imaging studies: No new pertinent imaging studies   Assessment/Plan:  78 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   - Stable vital signs, no fever not tachycardic  - No leukocytosis   - Continue NG tube at low-intermittent suction  - Continue NPO until return of GI function  - Continue IV fluids and pain management  - PT/OT evaluation   -- Helane Briceno Barrientos PA-C

## 2024-09-30 NOTE — Assessment & Plan Note (Signed)
 Blood pressure currently within goal. -Holding home lisinopril as patient is n.p.o. -As needed IV hydralazine 

## 2024-09-30 NOTE — Progress Notes (Signed)
  Progress Note   Patient: Anna Castaneda FMW:[id number] DOB: Nov 03, 1946 DOA: 09/29/2024     1 DOS: the patient was seen and examined on 09/30/2024   Brief hospital course: Partly taken from prior notes.   Anna Castaneda is a 78 y.o. female with medical history significant of HTN, HLD, CVA, chronic hyponatremia, anxiety/depression, MI, coming in with worsening of abdominal pain and feeling nausea and bloating.  No history of significant abdominal surgeries.  On presentation hemodynamically stable, labs pertinent for hyponatremia with sodium at 121, CT abdomen and pelvis with concern of SBO, markedly distended stomach and suspicious for atypical cecal volvulus.  General surgery was consulted, continue to get worse abdominal distention and pain despite having NG tube so she was taken to the OR s/p right hemicolectomy with primary anastomosis.  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  11/17: Vitals and hemoglobin stable, sodium at 126, seems like having chronic hyponatremia.  Hyponatremia labs with hypotonic hyponatremia, urine osmolality was normal with urine sodium of 66. Still no bowel function and NG tube with significant amount of bilious secretions.  Assessment and Plan: * SBO (small bowel obstruction) (HCC) 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 Patient still no bowel function and significant bilious secretions with NG tube. General surgery is on board. - Continue with n.p.o. -Continue with IV fluid -Continue with supportive care -PT and OT evaluation  Hyponatremia Seems like acute on chronic hyponatremia.  Sodium with some improvement 126 today. -Continue with normal saline -Monitor sodium  Hypertension Blood pressure currently within goal. -Holding home lisinopril as patient is n.p.o. -As needed IV hydralazine   Hyperlipidemia - Holding home statin as she is n.p.o.  GERD (gastroesophageal reflux disease) - On IV  Protonix    Subjective: Patient was sitting in chair when seen today.  NG tube with significant bilious secretions.  Pain seems controlled.  No flatus or BM yet.  A close friend at bedside  Physical Exam: Vitals:   09/29/24 1843 09/29/24 1928 09/30/24 0340 09/30/24 0759  BP: 116/70 107/66 107/65 119/60  Pulse: 84 82 82 80  Resp: 16 16 16 18   Temp: 97.8 F (36.6 C) 98 F (36.7 C) 98.8 F (37.1 C) 98.7 F (37.1 C)  TempSrc: Oral     SpO2: 96% 95% 93% 93%  Weight:      Height:       General. Frail and malnourished elderly lady, in no acute distress.  NG tube in place Pulmonary.  Lungs clear bilaterally, normal respiratory effort. CV.  Regular rate and rhythm, no JVD, rub or murmur. Abdomen.  Soft, nontender, nondistended, BS hypoactive CNS.  Alert and oriented .  No focal neurologic deficit. Extremities.  No edema, pulses intact and symmetrical. Psychiatry.  Judgment and insight appears normal.   Data Reviewed:  Prior data reviewed  Family Communication: Discussed with patient and a family friend at bedside  Disposition: Status is: Inpatient Remains inpatient appropriate because: Severity of illness  Planned Discharge Destination: To be determined  DVT prophylaxis.  Lovenox Time spent: 50 minutes  This record has been created using Conservation officer, historic buildings. Errors have been sought and corrected,but may not always be located. Such creation errors do not reflect on the standard of care.   Author: Amaryllis Dare, MD 09/30/2024 2:01 PM  For on call review [email or web address].

## 2024-10-01 ENCOUNTER — Inpatient Hospital Stay

## 2024-10-01 DIAGNOSIS — K562 Volvulus: Secondary | ICD-10-CM | POA: Diagnosis not present

## 2024-10-01 DIAGNOSIS — E871 Hypo-osmolality and hyponatremia: Secondary | ICD-10-CM | POA: Diagnosis not present

## 2024-10-01 DIAGNOSIS — K56609 Unspecified intestinal obstruction, unspecified as to partial versus complete obstruction: Secondary | ICD-10-CM | POA: Diagnosis not present

## 2024-10-01 DIAGNOSIS — I10 Essential (primary) hypertension: Secondary | ICD-10-CM | POA: Diagnosis not present

## 2024-10-01 LAB — CBC
HCT: 34.4 % — ABNORMAL LOW (ref 36.0–46.0)
Hemoglobin: 12.1 g/dL (ref 12.0–15.0)
MCH: 31.7 pg (ref 26.0–34.0)
MCHC: 35.2 g/dL (ref 30.0–36.0)
MCV: 90.1 fL (ref 80.0–100.0)
Platelets: 286 K/uL (ref 150–400)
RBC: 3.82 MIL/uL — ABNORMAL LOW (ref 3.87–5.11)
RDW: 14.5 % (ref 11.5–15.5)
WBC: 7.5 K/uL (ref 4.0–10.5)
nRBC: 0 % (ref 0.0–0.2)

## 2024-10-01 LAB — RENAL FUNCTION PANEL
Albumin: 3.1 g/dL — ABNORMAL LOW (ref 3.5–5.0)
Anion gap: 11 (ref 5–15)
BUN: 12 mg/dL (ref 8–23)
CO2: 23 mmol/L (ref 22–32)
Calcium: 7.8 mg/dL — ABNORMAL LOW (ref 8.9–10.3)
Chloride: 97 mmol/L — ABNORMAL LOW (ref 98–111)
Creatinine, Ser: 0.82 mg/dL (ref 0.44–1.00)
GFR, Estimated: >60 mL/min (ref >60)
Glucose, Bld: 71 mg/dL (ref 70–99)
Phosphorus: 3.3 mg/dL (ref 2.5–4.6)
Potassium: 3.7 mmol/L (ref 3.5–5.1)
Sodium: 131 mmol/L — ABNORMAL LOW (ref 135–145)

## 2024-10-01 LAB — SURGICAL PATHOLOGY

## 2024-10-01 MED ORDER — SODIUM CHLORIDE 0.9 % IV SOLN: INTRAVENOUS | Status: AC

## 2024-10-01 MED ORDER — DIATRIZOATE MEGLUMINE & SODIUM 66-10 % PO SOLN
90.0000 mL | Freq: Once | ORAL | Status: AC
  Administered 2024-10-01: 90 mL via NASOGASTRIC

## 2024-10-01 MED ORDER — ACETAMINOPHEN 10 MG/ML IV SOLN
1000.0000 mg | Freq: Four times a day (QID) | INTRAVENOUS | Status: AC
  Administered 2024-10-01 – 2024-10-02 (×4): 1000 mg via INTRAVENOUS
  Filled 2024-10-01 (×4): qty 100

## 2024-10-01 MED ORDER — PHENOL 1.4 % MT LIQD
1.0000 | OROMUCOSAL | Status: DC | PRN
  Administered 2024-10-01: 1 via OROMUCOSAL
  Filled 2024-10-01: qty 177

## 2024-10-01 NOTE — Assessment & Plan Note (Signed)
 Seems like acute on chronic hyponatremia.  Sodium with some improvement to 131 today. -Continue with normal saline -Monitor sodium

## 2024-10-01 NOTE — Progress Notes (Signed)
  Progress Note   Patient: Anna Castaneda FMW:[id number] DOB: [birth year] DOA: 09/29/2024     2 DOS: the patient was seen and examined on 10/01/2024   Brief hospital course: Partly taken from prior notes.   Anna Castaneda is a 78 y.o. female with medical history significant of HTN, HLD, CVA, chronic hyponatremia, anxiety/depression, MI, coming in with worsening of abdominal pain and feeling nausea and bloating.  No history of significant abdominal surgeries.  On presentation hemodynamically stable, labs pertinent for hyponatremia with sodium at 121, CT abdomen and pelvis with concern of SBO, markedly distended stomach and suspicious for atypical cecal volvulus.  General surgery was consulted, continue to get worse abdominal distention and pain despite having NG tube so she was taken to the OR s/p right hemicolectomy with primary anastomosis.  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  11/17: Vitals and hemoglobin stable, sodium at 126, seems like having chronic hyponatremia.  Hyponatremia labs with hypotonic hyponatremia, urine osmolality was normal with urine sodium of 66. Still no bowel function and NG tube with significant amount of bilious secretions.  11/18: Hemodynamically stable with improving sodium now at 131, still no bowel function.  Surgery is planning Gastrografin studies.  Assessment and Plan: * SBO (small bowel obstruction) (HCC) 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 Patient still no bowel function and significant bilious secretions with NG tube. General surgery is on board. -Gastrografin studies today - Continue with n.p.o. -Continue with IV fluid -Continue with supportive care -PT and OT evaluation  Hyponatremia Seems like acute on chronic hyponatremia.  Sodium with some improvement to 131 today. -Continue with normal saline -Monitor sodium  Hypertension Blood pressure currently within goal. -Holding home lisinopril as  patient is n.p.o. -As needed IV hydralazine   Hyperlipidemia - Holding home statin as she is n.p.o.  GERD (gastroesophageal reflux disease) - On IV Protonix    Subjective: Patient was having some worsening abdominal distention when seen today.  No gas or bowel movement yet. NG tube with bilious secretions.  Physical Exam: Vitals:   09/30/24 1445 09/30/24 1954 10/01/24 0414 10/01/24 0752  BP: 120/64 114/63 126/69 (!) 102/59  Pulse: 83 79 81 77  Resp: 17 16 16 18   Temp: 98.3 F (36.8 C) 98.6 F (37 C) (!) 97.4 F (36.3 C) 98.5 F (36.9 C)  TempSrc:  Oral Oral   SpO2: 100% 97% 93% 96%  Weight:      Height:       General.  Malnourished elderly lady, in no acute distress.  NG tube in place. Pulmonary.  Lungs clear bilaterally, normal respiratory effort. CV.  Regular rate and rhythm, no JVD, rub or murmur. Abdomen.  Mildly distended, mild diffuse tenderness, bowel sounds hypoactive CNS.  Alert and oriented .  No focal neurologic deficit. Extremities.  No edema,  pulses intact and symmetrical. Psychiatry.  Judgment and insight appears normal.   Data Reviewed:  Prior data reviewed  Family Communication: Discussed with patient and a family friend at bedside  Disposition: Status is: Inpatient Remains inpatient appropriate because: Severity of illness  Planned Discharge Destination: To be determined  DVT prophylaxis.  Lovenox Time spent: 50 minutes  This record has been created using Conservation officer, historic buildings. Errors have been sought and corrected,but may not always be located. Such creation errors do not reflect on the standard of care.   Author: Amaryllis Dare, MD 10/01/2024 3:02 PM  For on call review [email or web address].

## 2024-10-01 NOTE — Progress Notes (Addendum)
 [facility]- General Surgery  SURGICAL PROGRESS NOTE  Hospital Day(s): 2.   Post op day(s): 2 Days Post-Op.   Interval History:  Patient reports doing well this morning. Patient is optimistic for speedy recovery, she has mentioned she is very active and would like to go back to her normal life routine. Reports her pain is getting better with movement. Has not needed any pain medication. She admits throat irritation from NG tube. Otherwise she is doing well.  She appreciates working with PT and OT yesterday. Patient denies any nausea, vomiting, bowel movement or flatulence.   Vital signs in last 24 hours: [min-max] current  Temp:  [97.4 F (36.3 C)-98.6 F (37 C)] 98.5 F (36.9 C) (11/18 0752) Pulse Rate:  [77-83] 77 (11/18 0752) Resp:  [16-18] 18 (11/18 0752) BP: (102-126)/(59-69) 102/59 (11/18 0752) SpO2:  [93 %-100 %] 96 % (11/18 0752)     Height: 5' 4 (162.6 cm) Weight: 51.8 kg BMI (Calculated): 19.58   Intake/Output last 2 shifts:  11/17 0701 - 11/18 0700 In: 2003.7 [I.V.:1703.7; IV Piggyback:300] Out: 1750 [Urine:600; Emesis/NG output:1150]   Physical Exam:  Constitutional: alert, cooperative and no distress  Respiratory: breathing non-labored at rest  Cardiovascular: regular rate and sinus rhythm  Gastrointestinal: soft, non-tender, and non-distended, Prevena VAC intact  Labs:     Latest Ref Rng & Units 10/01/2024    4:04 AM 09/30/2024    4:25 AM 09/29/2024    6:21 AM  CBC  WBC 4.0 - 10.5 K/uL 7.5  7.9  7.4   Hemoglobin 12.0 - 15.0 g/dL 87.8  86.3  86.1   Hematocrit 36.0 - 46.0 % 34.4  38.7  39.1   Platelets 150 - 400 K/uL 286  352  353       Latest Ref Rng & Units 10/01/2024    4:04 AM 09/30/2024    4:25 AM 09/29/2024   10:50 PM  CMP  Glucose 70 - 99 mg/dL 71  891    BUN 8 - 23 mg/dL 12  11    Creatinine 9.55 - 1.00 mg/dL 9.17  9.11    Sodium 864 - 145 mmol/L 131  126  122   Potassium 3.5 - 5.1 mmol/L 3.7  4.7    Chloride 98 - 111 mmol/L 97  93     CO2 22 - 32 mmol/L 23  24    Calcium  8.9 - 10.3 mg/dL 7.8  8.1      Imaging studies: No new pertinent imaging studies   Assessment/Plan:  78 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    - Recovering as expected, stable vital signs  - Ordered small bowel study and will follow with abdominal x-ray this afternoon  - Continue NG tube at low-intermittent suction and NPO until return of GI function. Will plan to clamp NG tube if contrast is shown in rectum and patient is not experiencing nausea or vomiting.  - Added Chloraseptic spray for throat irritation  - Continue IV fluids and pain management  - Continue PT and OT   -- Traylen Eckels Barrientos PA-C

## 2024-10-01 NOTE — Progress Notes (Signed)
 Mobility Specialist Progress Note:    10/01/24 0856  Mobility  Activity Ambulated with assistance;Pivoted/transferred from bed to chair  Level of Assistance Standby assist, set-up cues, supervision of patient - no hands on  Assistive Device Front wheel walker  Distance Ambulated (ft) 800 ft  Range of Motion/Exercises Active;All extremities  Activity Response Tolerated well  Mobility visit 1 Mobility  Mobility Specialist Start Time (ACUTE ONLY) [id number]  Mobility Specialist Stop Time (ACUTE ONLY) 0856  Mobility Specialist Time Calculation (min) (ACUTE ONLY) 24 min   Pt received in bed, agreeable to mobility. Assistance from nursing staff for NG tube. Required SBA to stand and ambulate with RW. Tolerated well, pleasant and eager to ambulate. Returned to room, left pt in chair. Belongings in reach, all needs met.   Sherrilee Ditty Mobility Specialist Please contact via Special Educational Needs Teacher or  Rehab office at [phone]

## 2024-10-01 NOTE — Assessment & Plan Note (Signed)
 Blood pressure currently within goal. -Holding home lisinopril as patient is n.p.o. -As needed IV hydralazine 

## 2024-10-01 NOTE — Progress Notes (Signed)
 Mobility Specialist Progress Note:    10/01/24 1221  Mobility  Activity Ambulated with assistance  Level of Assistance Standby assist, set-up cues, supervision of patient - no hands on  Assistive Device Front wheel walker  Distance Ambulated (ft) 640 ft  Range of Motion/Exercises Active;All extremities  Activity Response Tolerated well  Mobility visit 1 Mobility  Mobility Specialist Start Time (ACUTE ONLY) 1148  Mobility Specialist Stop Time (ACUTE ONLY) 1202  Mobility Specialist Time Calculation (min) (ACUTE ONLY) 14 min   Pt received in chair, eager for mobility. Required SBA to stand and ambulate with RW. Tolerated well, pt required CGA with no AD for shorter distances. Returned to room, left in chair. NSG notified, all needs met.  Sherrilee Ditty Mobility Specialist Please contact via Special Educational Needs Teacher or  Rehab office at [phone]

## 2024-10-01 NOTE — Care Management Important Message (Signed)
 Important Message  Patient Details  Name: Anna Castaneda MRN: [id number] Date of Birth: 1946-02-01   Important Message Given:  Yes - Medicare IM     Anna Castaneda 10/01/2024, 4:39 PM

## 2024-10-01 NOTE — Progress Notes (Signed)
 Occupational Therapy Treatment Patient Details Name: Anna Castaneda MRN: [id number] DOB: [birth year] Today's Date: 10/01/2024   History of present illness Anna Castaneda is 78 y.o. 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 CT scan of the abdomen and pelvis that shows severe dilation of the cecum with concern of volvulus.  She also had severe stomach dilation.  NGT was placed. 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 R hemicolectomy with primary anastomosis completed.   OT comments  Pt received in the recliner, denies pain. Pt reports having just returned recently from the bathroom and politely declined further mobility efforts at this time. Reports plans to walk later with staff. Pt does endorse ongoing difficulty with LB dressing, particularly socks and shoes. She attempted to take off her grippy socks and abdominal pain/discomfort with bending prohibited her from completing. She is unable to use figure 4 technique citing loose joints as concern. Pt further educated in AE for LB ADL to improve access, independence, and comfort. Pt verbalized understanding. Reports plan to have assist at [facility] with LB ADL as needed during recovery. Pt denies additional questions/concerns. OT goals adequate for discharge. Will sign off.       If plan is discharge home, recommend the following:  Assistance with cooking/housework;Assist for transportation;Help with stairs or ramp for entrance;A little help with bathing/dressing/bathroom   Equipment Recommendations  Other (comment) (handheld shower head)    Recommendations for Other Services      Precautions / Restrictions Precautions Precautions: Fall;Other (comment) Recall of Precautions/Restrictions: Intact Precaution/Restrictions Comments:  abd incision, NG tube Restrictions Weight Bearing Restrictions Per Provider Order: No       Mobility Bed Mobility               General bed mobility comments: NT, in recliner    Transfers                   General transfer comment: Pt declined, reports having recently gotten up to the bathroom and agreeable to further mobility at later time     Balance Overall balance assessment: Needs assistance Sitting-balance support: Feet supported Sitting balance-Leahy Scale: Normal         ADL either performed or assessed with clinical judgement   ADL Overall ADL's : Needs assistance/impaired                     Lower Body Dressing: Minimal assistance;Sitting/lateral leans Lower Body Dressing Details (indicate cue type and reason): Pt continues to demo difficulty wiht LB dressing, particularly with socks/shoes 2/2 abdominal pain/discomfort with bending. She is unable to use figure 4 technique citing loose joints as concern. Pt further educated in AE for LB ADL to improve access, independence, and comfort.                     Communication Communication Communication: No apparent difficulties   Cognition Arousal: Alert Behavior During Therapy: WFL for tasks assessed/performed Cognition: No apparent impairments       Following commands: Intact        Cueing   Cueing Techniques: Verbal cues  Exercises Other Exercises Other Exercises: Pt further instructed in AE/DME for LB dressing            Pertinent Vitals/ Pain       Pain Assessment Pain Assessment: No/denies pain  Frequency  Min 1X/week        Progress Toward Goals  OT Goals(current goals can now be found in the care plan section)  Progress towards OT goals: Progressing toward goals;Goals met/education completed, patient discharged from OT (adequate for discharge)  Acute Rehab OT Goals Patient Stated Goal: gohome and return to my personal trainer OT Goal Formulation:  All assessment and education complete, DC therapy Time For Goal Achievement: 10/14/24 Potential to Achieve Goals: Good  Plan      Co-evaluation                 AM-PAC OT 6 Clicks Daily Activity     Outcome Measure   Help from another person eating meals?: None Help from another person taking care of personal grooming?: None Help from another person toileting, which includes using toliet, bedpan, or urinal?: None Help from another person bathing (including washing, rinsing, drying)?: A Little Help from another person to put on and taking off regular upper body clothing?: None Help from another person to put on and taking off regular lower body clothing?: A Little 6 Click Score: 22    End of Session    OT Visit Diagnosis: Muscle weakness (generalized) (M62.81)   Activity Tolerance Patient tolerated treatment well   Patient Left in chair;with call bell/phone within reach;with chair alarm set   Nurse Communication          Time: 1050-1100 OT Time Calculation (min): 10 min  Charges: OT General Charges $OT Visit: 1 Visit OT Treatments $Self Care/Home Management : 8-22 mins  Warren SAUNDERS., MPH, MS, OTR/L ascom [phone] 10/01/24, 11:34 AM

## 2024-10-01 NOTE — Plan of Care (Signed)
  Problem: Health Behavior/Discharge Planning: Goal: Ability to manage health-related needs will improve Outcome: Progressing   Problem: Clinical Measurements: Goal: Ability to maintain clinical measurements within normal limits will improve Outcome: Progressing Goal: Will remain free from infection Outcome: Progressing Goal: Diagnostic test results will improve Outcome: Progressing Goal: Respiratory complications will improve Outcome: Progressing Goal: Cardiovascular complication will be avoided Outcome: Progressing   Problem: Activity: Goal: Risk for activity intolerance will decrease Outcome: Progressing   Problem: Nutrition: Goal: Adequate nutrition will be maintained Outcome: Progressing   Problem: Coping: Goal: Level of anxiety will decrease Outcome: Progressing   Problem: Elimination: Goal: Will not experience complications related to bowel motility Outcome: Progressing Goal: Will not experience complications related to urinary retention Outcome: Progressing   Problem: Pain Managment: Goal: General experience of comfort will improve and/or be controlled Outcome: Progressing   Problem: Safety: Goal: Ability to remain free from injury will improve Outcome: Progressing

## 2024-10-01 NOTE — Assessment & Plan Note (Signed)
 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 Patient still no bowel function and significant bilious secretions with NG tube. General surgery is on board. -Gastrografin studies today - Continue with n.p.o. -Continue with IV fluid -Continue with supportive care -PT and OT evaluation

## 2024-10-02 ENCOUNTER — Inpatient Hospital Stay

## 2024-10-02 DIAGNOSIS — K56609 Unspecified intestinal obstruction, unspecified as to partial versus complete obstruction: Secondary | ICD-10-CM | POA: Diagnosis not present

## 2024-10-02 LAB — MAGNESIUM: Magnesium: 2.3 mg/dL (ref 1.7–2.4)

## 2024-10-02 MED ORDER — SODIUM CHLORIDE 0.9 % IV SOLN: INTRAVENOUS | Status: AC

## 2024-10-02 NOTE — Plan of Care (Signed)

## 2024-10-02 NOTE — Progress Notes (Signed)
 [facility]- General Surgery  SURGICAL PROGRESS NOTE  Hospital Day(s): 3.   Post op day(s): 3 Days Post-Op.   Interval History:  Small bowel study with gastrografin was ordered yesterday. Abdominal x-ray showed multiple dilated loops of bowel. Patient had has multiple bowel movements since. Denies passing gas. NGT was clamped yesterday for some time but patient started experiencing some nausea and so it was placed back on low-intermittent suction.  NGT output in the last 24 hrs - 890 cc  Vital signs in last 24 hours: [min-max] current  Temp:  [97.7 F (36.5 C)-98 F (36.7 C)] 98 F (36.7 C) (11/19 0226) Pulse Rate:  [81-83] 83 (11/19 0226) Resp:  [18-20] 20 (11/19 0226) BP: (107-111)/(54-64) 111/54 (11/19 0226) SpO2:  [95 %-98 %] 95 % (11/19 0226)     Height: 5' 4 (162.6 cm) Weight: 51.8 kg BMI (Calculated): 19.58   Intake/Output last 2 shifts:  11/18 0701 - 11/19 0700 In: 60 [NG/GT:60] Out: 1490 [Urine:600; Emesis/NG output:890]   Physical Exam:  Constitutional: alert, cooperative and no distress  Respiratory: breathing non-labored at rest  Cardiovascular: regular rate and sinus rhythm  Gastrointestinal: soft, non-tender, and distended and tympanic on exam, Prevena VAC intact   Labs:     Latest Ref Rng & Units 10/01/2024    4:04 AM 09/30/2024    4:25 AM 09/29/2024    6:21 AM  CBC  WBC 4.0 - 10.5 K/uL 7.5  7.9  7.4   Hemoglobin 12.0 - 15.0 g/dL 87.8  86.3  86.1   Hematocrit 36.0 - 46.0 % 34.4  38.7  39.1   Platelets 150 - 400 K/uL 286  352  353       Latest Ref Rng & Units 10/01/2024    4:04 AM 09/30/2024    4:25 AM 09/29/2024   10:50 PM  CMP  Glucose 70 - 99 mg/dL 71  891    BUN 8 - 23 mg/dL 12  11    Creatinine 9.55 - 1.00 mg/dL 9.17  9.11    Sodium 864 - 145 mmol/L 131  126  122   Potassium 3.5 - 5.1 mmol/L 3.7  4.7    Chloride 98 - 111 mmol/L 97  93    CO2 22 - 32 mmol/L 23  24    Calcium  8.9 - 10.3 mg/dL 7.8  8.1      Imaging studies:  CLINICAL  DATA:  Small-bowel obstruction   EXAM: PORTABLE ABDOMEN - 1 VIEW   COMPARISON:  Abdominal x-ray 09/29/2024, CT abdomen and pelvis 09/29/2024.   FINDINGS: Enteric tube tip is in the region of the presumed gastric body. Gaseous distention of the stomach has decreased. There are new midline surgical clips present. There are multiple dilated loops of small bowel measuring up to 4 cm containing contrast within the lower abdomen. No significant colonic gas identified. Free air is difficult to exclude on a supine view.   IMPRESSION: 1. Multiple dilated loops of small bowel measuring up to 4 cm containing contrast within the lower abdomen. No significant colonic gas identified. Findings are concerning for small bowel obstruction. Postoperative ileus is also in the differential. 2. Enteric tube tip is in the region of the presumed gastric body.     Electronically Signed   By: Greig Pique M.D.   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    - Patient is likely experiencing post-op ileus. Although she's had multiple bowel movements, she was still distended and tympanic on exam.    - Started clear liquid diet and will plan to keep NGT clamped. If patient starts feeling nauseous, will plan to place NPO again with have NGT on low-intermittent suction. Will advance diet slowly.   - Continue pain management as needed    - Encouraged to continue to ambulate    -- [employer] PA-C

## 2024-10-02 NOTE — Progress Notes (Addendum)
 Physical Therapy Treatment Patient Details Name: Anna Castaneda MRN: [id number] DOB: [birth year] Today's Date: 10/02/2024   History of Present Illness Anna Castaneda is 78 y.o. 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 CT scan of the abdomen and pelvis that shows severe dilation of the cecum with concern of volvulus.  She also had severe stomach dilation.  NGT was placed. 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 R hemicolectomy with primary anastomosis completed.    PT Comments  Patient seen for PT session focused on cardio-vascular endurance and prolonged walking. Patient required supervision progressing to ModI for prolonged ambulation and used SPC. Tolerated session well with mild  signs of exertion or distress. Vitals remained stable during activity. Interventions aimed at improving normalizing gait. Patient shows great potential to make progress with continued acute level rehab. Patient continues to demonstrate little to no activity restrictions activity restrictions and poor tolerance for progressive mobility. Continued skilled PT recommended to progress toward functional goals and support discharge readiness. Pt making good progress toward goals, will continue to follow POC. Discharge recommendation remains appropriate     If plan is discharge home, recommend the following: A little help with walking and/or transfers;A little help with bathing/dressing/bathroom   Can travel by private vehicle        Equipment Recommendations  None recommended by PT    Recommendations for Other Services       Precautions / Restrictions Precautions Precautions: Fall;Other (comment) Recall of Precautions/Restrictions: Intact Restrictions Weight Bearing Restrictions Per Provider Order: No      Mobility  Bed Mobility Overal bed mobility: Modified Independent                  Transfers Overall transfer level: Needs assistance Equipment used: 1 person hand held assist Transfers: Sit to/from Stand Sit to Stand: Supervision                Ambulation/Gait Ambulation/Gait assistance: Contact guard assist, Supervision Gait Distance (Feet): 500 Feet Assistive device: Straight cane Gait Pattern/deviations: Step-to pattern, Drifts right/left, Trunk flexed, Narrow base of support Gait velocity: steady; normal walking speed     General Gait Details: mild unsteadiness   Stairs             Wheelchair Mobility     Tilt Bed    Modified Rankin (Stroke Patients Only)       Balance Overall balance assessment: Needs assistance Sitting-balance support: Feet supported Sitting balance-Leahy Scale: Normal     Standing balance support: During functional activity, Single extremity supported Standing balance-Leahy Scale: Good                              Communication Communication Communication: No apparent difficulties  Cognition Arousal: Alert Behavior During Therapy: WFL for tasks assessed/performed                             Following commands: Intact      Cueing Cueing Techniques: Verbal cues  Exercises      General Comments        Pertinent Vitals/Pain Pain Assessment Pain Assessment: No/denies pain    Home Living  Prior Function            PT Goals (current goals can now be found in the care plan section) Acute Rehab PT Goals Patient Stated Goal: Pt wants to get back to working out PT Goal Formulation: With patient Time For Goal Achievement: 09/30/24 Potential to Achieve Goals: Good Progress towards PT goals: Progressing toward goals    Frequency    Min 1X/week      PT Plan      Co-evaluation              AM-PAC PT 6 Clicks Mobility    Outcome Measure  Help needed turning from your back to your side while in a flat bed without using bedrails?: None Help needed moving from lying on your back to sitting on the side of a flat bed without using bedrails?: None Help needed moving to and from a bed to a chair (including a wheelchair)?: None Help needed standing up from a chair using your arms (e.g., wheelchair or bedside chair)?: None Help needed to walk in hospital room?: None Help needed climbing 3-5 steps with a railing? : A Little 6 Click Score: 23    End of Session   Activity Tolerance: Patient tolerated treatment well Patient left: with nursing/sitter in room;with call bell/phone within reach Nurse Communication: Mobility status PT Visit Diagnosis: Difficulty in walking, not elsewhere classified (R26.2);Other abnormalities of gait and mobility (R26.89)     Time: 8952-8941 PT Time Calculation (min) (ACUTE ONLY): 11 min  Charges:    $Therapeutic Activity: 8-22 mins PT General Charges $$ ACUTE PT VISIT: 1 Visit                     Sherlean Lesches DPT, PT     Sherlean A Tiearra Colwell 10/02/2024, 11:12 AM

## 2024-10-02 NOTE — Progress Notes (Signed)
 Mobility Specialist Progress Note:    10/02/24 1521  Mobility  Activity Ambulated with assistance  Level of Assistance Contact guard assist, steadying assist  Assistive Device Cane  Distance Ambulated (ft) 800 ft  Range of Motion/Exercises Active;All extremities  Activity Response Tolerated well  Mobility visit 1 Mobility  Mobility Specialist Start Time (ACUTE ONLY) 1508  Mobility Specialist Stop Time (ACUTE ONLY) 1521  Mobility Specialist Time Calculation (min) (ACUTE ONLY) 13 min   Pt received in chair, eager for mobility. Required CGA to stand and ambulate with cane. Tolerated well, asx throughout. Returned to chair, all needs met.  Sherrilee Ditty Mobility Specialist Please contact via Special Educational Needs Teacher or  Rehab office at [phone]

## 2024-10-02 NOTE — Plan of Care (Signed)

## 2024-10-02 NOTE — TOC Initial Note (Signed)
 Transition of Care [facility]) - Initial/Assessment Note    Patient Details  Name: Anna Castaneda MRN: [id number] Date of Birth: 07/06/1946  Transition of Care [facility]) CM/SW Contact:    Daved JONETTA Hamilton, RN Phone Number: 10/02/2024, 1:17 PM  Clinical Narrative:                  Met with patient, introduced self and explained role and discussed therapy recommendations for patient to receive HH PT. Patient verbalized she lives at [facility] independent living and they have PT/OT there that she will use. Patient verbalized she also has a systems analyst that she has spoken with and provided her restrictions to them. Patient verbalized that she has already been in contact with J. Minor [phone] at [facility] who is arranging everything she needs for when she gets home. Patient verbalized she has the equipment she needs already and that [facility] is in the process of installing a mobile shower head that turns on and off. When this CM discussed transportation back to [facility] once patient is medically ready for discharge, patient verbalized [place] will transport her.   This CM reached out to J.Minor, lvmm.  TOC will continue to follow  Expected Discharge Plan: Home w Home Health Services Barriers to Discharge: Continued Medical Work up   Patient Goals and CMS Choice            Expected Discharge Plan and Services   Discharge Planning Services: CM Consult   Living arrangements for the past 2 months: Independent Living Facility                                      Prior Living Arrangements/Services Living arrangements for the past 2 months: Independent Living Facility Lives with:: Self Patient language and need for interpreter reviewed:: Yes Do you feel safe going back to the place where you live?: Yes      Need for Family Participation in Patient Care: No (Comment) Care giver support system in place?: Yes (comment)   Criminal Activity/Legal Involvement Pertinent to  Current Situation/Hospitalization: No - Comment as needed  Activities of Daily Living   ADL Screening (condition at time of admission) Independently performs ADLs?: Yes (appropriate for developmental age) Is the patient deaf or have difficulty hearing?: Yes Does the patient have difficulty seeing, even when wearing glasses/contacts?: No Does the patient have difficulty concentrating, remembering, or making decisions?: No  Permission Sought/Granted Permission sought to share information with : Case Manager, Photographer granted to share info w AGENCY: [place]        Emotional Assessment Appearance:: Appears stated age, Well-Groomed Attitude/Demeanor/Rapport: Engaged, Self-Confident Affect (typically observed): Appropriate Orientation: : Oriented to Self, Oriented to  Time, Oriented to Situation, Oriented to Place Alcohol / Substance Use: Not Applicable Psych Involvement: No (comment)  Admission diagnosis:  Abdominal distention [R14.0] Hyponatremia [E87.1] Bloating [R14.0] SBO (small bowel obstruction) (HCC) [K56.609] Patient Active Problem List   Diagnosis Date Noted   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   SBO (small bowel obstruction) (HCC) 09/29/2024   Rhabdomyolysis 11/11/2023   NSTEMI (non-ST elevated myocardial infarction) (HCC) 11/11/2023   Acute urinary retention 11/11/2023   Altered mental status 11/11/2023   Seizures (HCC) 05/24/2019   Fall 05/24/2019   Thigh hematoma, right, sequela 05/24/2019   GERD (gastroesophageal reflux disease) 05/24/2019   Hypocalcemia 05/24/2019  Hemorrhagic shock (HCC) 05/22/2019   Hypertension 05/22/2019   Hyperlipidemia 05/22/2019   Hypokalemia 05/22/2019   Stroke-like episode (HCC) s/p tPA 05/21/2019   Hyponatremia    PCP:  Sadie Manna, MD Pharmacy:   [id number] [place], [place] - [address] AT [facility] [address] [place]  [address] Phone: [phone] Fax: [phone]     Social Drivers of Health (SDOH) Social History: SDOH Screenings   Food Insecurity: No Food Insecurity (09/29/2024)  Housing: Low Risk  (09/29/2024)  Transportation Needs: No Transportation Needs (09/29/2024)  Utilities: Not At Risk (09/29/2024)  Alcohol Screen: Low Risk  (08/22/2023)  Financial Resource Strain: Low Risk  (06/07/2024)   Received from [facility]  Physical Activity: Sufficiently Active (08/22/2023)  Social Connections: Moderately Integrated (09/29/2024)  Stress: No Stress Concern Present (08/22/2023)  Tobacco Use: Medium Risk (09/29/2024)   SDOH Interventions:     Readmission Risk Interventions     No data to display

## 2024-10-02 NOTE — Progress Notes (Signed)
 Mobility Specialist Progress Note:    10/02/24 0900  Mobility  Activity Ambulated with assistance  Level of Assistance Standby assist, set-up cues, supervision of patient - no hands on  Assistive Device Front wheel walker  Distance Ambulated (ft) 800 ft  Range of Motion/Exercises Active;All extremities  Activity Response Tolerated well  Mobility visit 1 Mobility  Mobility Specialist Start Time (ACUTE ONLY) [id number]  Mobility Specialist Stop Time (ACUTE ONLY) 0900  Mobility Specialist Time Calculation (min) (ACUTE ONLY) 26 min   Pt received in chair, agreeable to mobility. Required SBA to stand and ambulate with RW. Tolerated well, pt requires CGA with no AD for shorter distances. Returned to room, assisted pt to bathroom for BM. Returned pt to chair, belongings in reach and nurse at bedside. All needs met.  Sherrilee Ditty Mobility Specialist Please contact via Special Educational Needs Teacher or  Rehab office at [phone]

## 2024-10-02 NOTE — Progress Notes (Signed)
 Progress Note   Patient: Anna Castaneda FMW:[id number] DOB: 08/02/1946 DOA: 09/29/2024     3 DOS: the patient was seen and examined on 10/02/2024   Brief hospital course: Partly taken from prior notes.   Anna Castaneda is a 78 y.o. female with medical history significant of HTN, HLD, CVA, chronic hyponatremia, anxiety/depression, MI, coming in with worsening of abdominal pain and feeling nausea and bloating.  No history of significant abdominal surgeries.  On presentation hemodynamically stable, labs pertinent for hyponatremia with sodium at 121, CT abdomen and pelvis with concern of SBO, markedly distended stomach and suspicious for atypical cecal volvulus.  General surgery was consulted, continue to get worse abdominal distention and pain despite having NG tube so she was taken to the OR s/p right hemicolectomy with primary anastomosis.  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  11/17: Vitals and hemoglobin stable, sodium at 126, seems like having chronic hyponatremia.  Hyponatremia labs with hypotonic hyponatremia, urine osmolality was normal with urine sodium of 66. Still no bowel function and NG tube with significant amount of bilious secretions.  11/18: Hemodynamically stable with improving sodium now at 131, still no bowel function.  Surgery is planning Gastrografin studies.  Assessment and Plan:  # SBO (small bowel obstruction) (HCC) 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 Patient still no bowel function and significant bilious secretions with NG tube. General surgery is on board. -11/18 s/p Gastrografin studies, possible ileus versus SBO -Continue with IV fluid -Continue with supportive care -PT and OT evaluation 11/19 NG tube clamped, x-ray ordered and started on clear liquid diet as per general surgery.  Hyponatremia Seems like acute on chronic hyponatremia.  Sodium with some improvement to 131 today. -Continue with normal  saline -Monitor sodium  Hypertension Blood pressure currently within goal. -Holding home lisinopril as patient is n.p.o. -As needed IV hydralazine   Hyperlipidemia - Holding home statin as she is n.p.o.  GERD (gastroesophageal reflux disease) - On IV Protonix    Subjective: No significant overnight events, patient is moving bowels, had multiple BM after Gastrografin x-ray. Denies any significant abdominal pain, patient was started on clear liquid diet, no nausea or vomiting. Patient was sitting comfortably on the recliner, did not offer any complaints.   Physical Exam: Vitals:   10/01/24 0752 10/01/24 1536 10/01/24 1952 10/02/24 0226  BP: (!) 102/59 (!) 107/58 110/64 (!) 111/54  Pulse: 77 82 81 83  Resp: 18 18 20 20   Temp: 98.5 F (36.9 C) 97.7 F (36.5 C) 98 F (36.7 C) 98 F (36.7 C)  TempSrc:      SpO2: 96% 98% 96% 95%  Weight:      Height:       General.  Malnourished elderly lady, in no acute distress.  NG tube in place. Pulmonary.  Lungs clear bilaterally, normal respiratory effort. CV.  Regular rate and rhythm, no JVD, rub or murmur. Abdomen.  Mildly distended, mild diffuse tenderness, bowel sounds hypoactive CNS.  Alert and oriented .  No focal neurologic deficit. Extremities.  No edema,  pulses intact and symmetrical. Psychiatry.  Judgment and insight appears normal.   Data Reviewed:  Prior data reviewed  Family Communication: Discussed with patient and a family friend at bedside  Disposition: Status is: Inpatient Remains inpatient appropriate because: Severity of illness  Planned Discharge Destination: To be determined  DVT prophylaxis.  Lovenox Time spent: 42 minutes  This record has been created using Conservation officer, historic buildings. Errors have been sought and  corrected,but may not always be located. Such creation errors do not reflect on the standard of care.   Author: Elvan Sor, MD 10/02/2024 3:18 PM  For on call review  [email or web address].

## 2024-10-03 ENCOUNTER — Other Ambulatory Visit: Payer: Self-pay

## 2024-10-03 DIAGNOSIS — K56609 Unspecified intestinal obstruction, unspecified as to partial versus complete obstruction: Secondary | ICD-10-CM | POA: Diagnosis not present

## 2024-10-03 LAB — CBC
HCT: 34.5 % — ABNORMAL LOW (ref 36.0–46.0)
Hemoglobin: 11.6 g/dL — ABNORMAL LOW (ref 12.0–15.0)
MCH: 30.8 pg (ref 26.0–34.0)
MCHC: 33.6 g/dL (ref 30.0–36.0)
MCV: 91.5 fL (ref 80.0–100.0)
Platelets: 342 K/uL (ref 150–400)
RBC: 3.77 MIL/uL — ABNORMAL LOW (ref 3.87–5.11)
RDW: 14.8 % (ref 11.5–15.5)
WBC: 5.6 K/uL (ref 4.0–10.5)
nRBC: 0 % (ref 0.0–0.2)

## 2024-10-03 LAB — GLUCOSE, CAPILLARY
Glucose-Capillary: 134 mg/dL — ABNORMAL HIGH (ref 70–99)
Glucose-Capillary: 158 mg/dL — ABNORMAL HIGH (ref 70–99)
Glucose-Capillary: 60 mg/dL — ABNORMAL LOW (ref 70–99)
Glucose-Capillary: 80 mg/dL (ref 70–99)
Glucose-Capillary: 93 mg/dL (ref 70–99)

## 2024-10-03 LAB — HEMOGLOBIN A1C
Hgb A1c MFr Bld: 5.5 % (ref 4.8–5.6)
Mean Plasma Glucose: 111.15 mg/dL

## 2024-10-03 LAB — MAGNESIUM: Magnesium: 2.4 mg/dL (ref 1.7–2.4)

## 2024-10-03 LAB — BASIC METABOLIC PANEL WITH GFR
Anion gap: 12 (ref 5–15)
BUN: 11 mg/dL (ref 8–23)
CO2: 23 mmol/L (ref 22–32)
Calcium: 7.5 mg/dL — ABNORMAL LOW (ref 8.9–10.3)
Chloride: 106 mmol/L (ref 98–111)
Creatinine, Ser: 0.61 mg/dL (ref 0.44–1.00)
GFR, Estimated: >60 mL/min (ref >60)
Glucose, Bld: 67 mg/dL — ABNORMAL LOW (ref 70–99)
Potassium: 3.3 mmol/L — ABNORMAL LOW (ref 3.5–5.1)
Sodium: 141 mmol/L (ref 135–145)

## 2024-10-03 LAB — IRON AND TIBC
Iron: 18 ug/dL — ABNORMAL LOW (ref 28–170)
Saturation Ratios: 9 % — ABNORMAL LOW (ref 10.4–31.8)
TIBC: 203 ug/dL — ABNORMAL LOW (ref 250–450)
UIBC: 185 ug/dL

## 2024-10-03 LAB — PHOSPHORUS: Phosphorus: 1.5 mg/dL — ABNORMAL LOW (ref 2.5–4.6)

## 2024-10-03 LAB — VITAMIN D 25 HYDROXY (VIT D DEFICIENCY, FRACTURES): Vit D, 25-Hydroxy: 34.08 ng/mL (ref 30–100)

## 2024-10-03 LAB — FOLATE: Folate: 9.3 ng/mL (ref >5.9)

## 2024-10-03 LAB — VITAMIN B12: Vitamin B-12: 1128 pg/mL — ABNORMAL HIGH (ref 180–914)

## 2024-10-03 MED ORDER — SODIUM CHLORIDE 0.9 % IV SOLN: INTRAVENOUS | Status: AC

## 2024-10-03 MED ORDER — SODIUM CHLORIDE 0.9% FLUSH: 10.0000 mL | INTRAVENOUS | Status: DC | PRN

## 2024-10-03 MED ORDER — TRAVASOL 10 % IV SOLN
INTRAVENOUS | Status: AC
  Filled 2024-10-03: qty 528

## 2024-10-03 MED ORDER — POTASSIUM PHOSPHATES 15 MMOLE/5ML IV SOLN
30.0000 mmol | Freq: Once | INTRAVENOUS | Status: AC
  Administered 2024-10-03: 30 mmol via INTRAVENOUS
  Filled 2024-10-03: qty 10

## 2024-10-03 MED ORDER — DEXTROSE 50 % IV SOLN
12.5000 g | INTRAVENOUS | Status: AC
  Administered 2024-10-03: 12.5 g via INTRAVENOUS
  Filled 2024-10-03: qty 50

## 2024-10-03 MED ORDER — SODIUM CHLORIDE 0.9% FLUSH
10.0000 mL | Freq: Two times a day (BID) | INTRAVENOUS | Status: DC
  Administered 2024-10-03 – 2024-10-07 (×6): 10 mL

## 2024-10-03 MED ORDER — INSULIN ASPART 100 UNIT/ML IJ SOLN: 0.0000 [IU] | INTRAMUSCULAR | Status: DC

## 2024-10-03 NOTE — Progress Notes (Signed)
 Peripherally Inserted Central Catheter Placement  The IV Nurse has discussed with the patient and/or persons authorized to consent for the patient, the purpose of this procedure and the potential benefits and risks involved with this procedure.  The benefits include less needle sticks, lab draws from the catheter, and the patient may be discharged home with the catheter. Risks include, but not limited to, infection, bleeding, blood clot (thrombus formation), and puncture of an artery; nerve damage and irregular heartbeat and possibility to perform a PICC exchange if needed/ordered by physician.  Alternatives to this procedure were also discussed.  Bard Power PICC patient education guide, fact sheet on infection prevention and patient information card has been provided to patient /or left at bedside.    PICC Placement Documentation  PICC Double Lumen 10/03/24 Right Brachial 38 cm 0 cm (Active)  Indication for Insertion or Continuance of Line Administration of hyperosmolar/irritating solutions (i.e. TPN, Vancomycin, etc.) 10/03/24 1157  Exposed Catheter (cm) 0 cm 10/03/24 1157  Site Assessment Clean, Dry, Intact 10/03/24 1157  Lumen #1 Status Flushed;Blood return noted;Saline locked 10/03/24 1157  Lumen #2 Status Flushed;Blood return noted;Saline locked 10/03/24 1157  Dressing Type Transparent 10/03/24 1157  Dressing Status Clean, Dry, Intact 10/03/24 1157  Line Care Connections checked and tightened 10/03/24 1157  Line Adjustment (NICU/IV Team Only) No 10/03/24 1157  Dressing Intervention New dressing 10/03/24 1157  Dressing Change Due 10/10/24 10/03/24 1157       Anna Castaneda 10/03/2024, 11:59 AM

## 2024-10-03 NOTE — Progress Notes (Signed)
 Progress Note   Patient: Anna Castaneda FMW:[id number] DOB: 30-Oct-1946 DOA: 09/29/2024     4 DOS: the patient was seen and examined on 10/03/2024   Brief hospital course: Partly taken from prior notes.   Anna Castaneda is a 78 y.o. female with medical history significant of HTN, HLD, CVA, chronic hyponatremia, anxiety/depression, MI, coming in with worsening of abdominal pain and feeling nausea and bloating.  No history of significant abdominal surgeries.  On presentation hemodynamically stable, labs pertinent for hyponatremia with sodium at 121, CT abdomen and pelvis with concern of SBO, markedly distended stomach and suspicious for atypical cecal volvulus.  General surgery was consulted, continue to get worse abdominal distention and pain despite having NG tube so she was taken to the OR s/p right hemicolectomy with primary anastomosis.  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  11/17: Vitals and hemoglobin stable, sodium at 126, seems like having chronic hyponatremia.  Hyponatremia labs with hypotonic hyponatremia, urine osmolality was normal with urine sodium of 66. Still no bowel function and NG tube with significant amount of bilious secretions.  11/18: Hemodynamically stable with improving sodium now at 131, still no bowel function.  Surgery is planning Gastrografin studies.  Assessment and Plan:  # SBO (small bowel obstruction) (HCC) 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 Patient still no bowel function and significant bilious secretions with NG tube. General surgery is on board. -11/18 s/p Gastrografin studies, possible ileus versus SBO -Continue with IV fluid -Continue with supportive care -PT and OT evaluation 11/19 NG tube clamped, x-ray ordered and started on clear liquid diet as per general surgery. 11/20 postop ileus, started TPN, n.p.o. and continued NG tube with intermittent suction as per general surgery.  #  Hyponatremia: Resolved Seems like acute on chronic hyponatremia.  Na 141 -Continue with normal saline -Monitor sodium  # Hypokalemia, potassium repleted. # Hypophosphatemia, Phos repleted per Monitor electrolytes and replete as needed.  # Hypoglycemia on 11/20 Follow hypoglycemia protocol Started TPN by general surgery  Hypertension Blood pressure currently within goal. -Holding home lisinopril as patient is n.p.o. -As needed IV hydralazine   Hyperlipidemia - Holding home statin as she is n.p.o.  GERD (gastroesophageal reflux disease) - On IV Protonix   # Iron deficiency, Tsat 9% Start oral iron supplement with vitamin C on discharge Follow-up with PCP to repeat iron profile after 3 to 6 months  11/20 s/p PICC line inserted for TPN  Subjective: No significant overnight events.  Patient had hypoglycemia due to nutrient deficiency, which was corrected by D50 IV given. Patient was recovered in the recliner, stated that she heard some rumbling sound from the stomach, no BM and no gas.  Denies any pain    Physical Exam: Vitals:   10/02/24 1942 10/03/24 0325 10/03/24 0347 10/03/24 0838  BP: (!) 127/59 128/66  (!) 142/66  Pulse: 70 68  75  Resp: 16 16  18   Temp: 98 F (36.7 C) 98.1 F (36.7 C)  97.9 F (36.6 C)  TempSrc:      SpO2: 100% 95%  100%  Weight:   50.2 kg   Height:       General.  Malnourished elderly lady, in no acute distress.  NG tube in place. Pulmonary.  Lungs clear bilaterally, normal respiratory effort. CV.  Regular rate and rhythm, no JVD, rub or murmur. Abdomen.  Mildly distended, mild diffuse tenderness, bowel sounds hypoactive CNS.  Alert and oriented .  No focal neurologic deficit. Extremities.  No  edema,  pulses intact and symmetrical. Psychiatry.  Judgment and insight appears normal.   Data Reviewed:  Prior data reviewed  Family Communication: Discussed with patient and a family friend at bedside  Disposition: Status is:  Inpatient Remains inpatient appropriate because: Severity of illness  Planned Discharge Destination: To be determined  DVT prophylaxis.  Lovenox Time spent: 45 minutes  This record has been created using Conservation officer, historic buildings. Errors have been sought and corrected,but may not always be located. Such creation errors do not reflect on the standard of care.   Author: Elvan Sor, MD 10/03/2024 2:58 PM  For on call review [email or web address].

## 2024-10-03 NOTE — Progress Notes (Signed)
 Mobility Specialist - Progress Note   10/03/24 0932  Mobility  Activity Ambulated with assistance  Level of Assistance Standby assist, set-up cues, supervision of patient - no hands on  Assistive Device Cane  Distance Ambulated (ft) 960 ft  Activity Response Tolerated well  Mobility visit 1 Mobility  Mobility Specialist Start Time (ACUTE ONLY) 0909  Mobility Specialist Stop Time (ACUTE ONLY) [id number]  Mobility Specialist Time Calculation (min) (ACUTE ONLY) 19 min   Pt supine upon entry, utilizing RA-- motivated and agreeable to OOB amb this date. Pt amb 6 laps around the NS, tolerated well. Pt returned to the room, left seated in the recliner with needs within reach.   America Silvan Mobility Specialist 10/03/24 10:02 AM

## 2024-10-03 NOTE — Progress Notes (Signed)
 [facility]- General Surgery  SURGICAL PROGRESS NOTE  Hospital Day(s): 4.   Post op day(s): 4 Days Post-Op.   Interval History:  Repeat abdominal x-ray from yesterday showed persistent dilated bowel loops compatible with ileus. NG tube was placed on low intermittent suction and patient was placed NPO. This morning patient denies any bowel movement or fluctuance.  She denies any nausea or vomiting. Reported output in the last 24 hours from NG tube was 1550 cc. Patient has also been having ice chips.  Vital signs in last 24 hours: [min-max] current  Temp:  [97.8 F (36.6 C)-98.1 F (36.7 C)] 98.1 F (36.7 C) (11/20 0325) Pulse Rate:  [68-80] 68 (11/20 0325) Resp:  [16] 16 (11/20 0325) BP: (127-138)/(59-66) 128/66 (11/20 0325) SpO2:  [95 %-100 %] 95 % (11/20 0325) Weight:  [50.2 kg] 50.2 kg (11/20 0347)     Height: 5' 4 (162.6 cm) Weight: 50.2 kg BMI (Calculated): 18.99   Intake/Output last 2 shifts:  11/19 0701 - 11/20 0700 In: -  Out: 1550 [Emesis/NG output:1550]   Physical Exam:  Constitutional: alert, cooperative and no distress  Respiratory: breathing non-labored at rest  Cardiovascular: regular rate and sinus rhythm  Gastrointestinal: soft, non-tender, and less distended, still tympanic, Prevena VAC in place  Labs:     Latest Ref Rng & Units 10/03/2024    5:44 AM 10/01/2024    4:04 AM 09/30/2024    4:25 AM  CBC  WBC 4.0 - 10.5 K/uL 5.6  7.5  7.9   Hemoglobin 12.0 - 15.0 g/dL 88.3  87.8  86.3   Hematocrit 36.0 - 46.0 % 34.5  34.4  38.7   Platelets 150 - 400 K/uL 342  286  352       Latest Ref Rng & Units 10/03/2024    5:44 AM 10/01/2024    4:04 AM 09/30/2024    4:25 AM  CMP  Glucose 70 - 99 mg/dL 67  71  891   BUN 8 - 23 mg/dL 11  12  11    Creatinine 0.44 - 1.00 mg/dL 9.38  9.17  9.11   Sodium 135 - 145 mmol/L 141  131  126   Potassium 3.5 - 5.1 mmol/L 3.3  3.7  4.7   Chloride 98 - 111 mmol/L 106  97  93   CO2 22 - 32 mmol/L 23  23  24    Calcium  8.9 -  10.3 mg/dL 7.5  7.8  8.1     Imaging studies:  EXAM: 2 VIEW XRAY OF THE ABDOMEN 10/02/2024 10:42:02 AM   COMPARISON: 10/01/2024   CLINICAL HISTORY: Ileus following gastrointestinal surgery (HCC)   FINDINGS:   LINES, TUBES AND DEVICES: Enteric tube in place with tip in left mid abdomen.   BOWEL: Persistent gaseous dilated loops of bowel loops compatible with ileus.   SOFT TISSUES: Skin staples noted. Free air under right hemidiaphragm, likely postsurgical. No opaque urinary calculi.   BONES: No acute osseous abnormality.   IMPRESSION: 1. Persistent gaseous dilated bowel loops compatible with ileus. 2. Free air under the right hemidiaphragm, likely postoperative.   Electronically signed by: Waddell Calk MD 10/02/2024 01:50 PM EST RP Workstation: [id number]   Assessment/Plan:  78 y.o. female with cecal volvulus 4 Days Post-Op s/p right hemicolectomy with primary anastomosis, complicated by pertinent comorbidities including essential hypertension, dyslipidemia, and GERD.   Post-op ileus Day 2    - Stable vital signs, no fever not tachycardic and no leukocytosis  - Patient was less  distended on exam but still tympanic.  She is having large NG output, but is also having ice chips. Will continue to monitor.  - Continue NG tube at low-intermittent suction and NPO   - Start TPN  - Encouraged to continue ambulate  -- Philo Kurtz Barrientos PA-C

## 2024-10-03 NOTE — Progress Notes (Signed)
 Initial Nutrition Assessment  DOCUMENTATION CODES:   Severe malnutrition in context of social or environmental circumstances  INTERVENTION:   TPN per pharmacy   Recommend thiamine 100mg  IV daily x 5-7 days   Pt at high refeed risk; recommend monitor potassium, magnesium and phosphorus labs daily until stable  Daily weights   NUTRITION DIAGNOSIS:   Severe Malnutrition related to social / environmental circumstances as evidenced by severe fat depletion, severe muscle depletion.  GOAL:   Patient will meet greater than or equal to 90% of their needs  MONITOR:   Diet advancement, Labs, Weight trends, Skin, I & O's, Other (Comment) (TPN)  REASON FOR ASSESSMENT:   Consult Assessment of nutrition requirement/status  ASSESSMENT:   78 y/o female with h/o HTN, HLD, GERD, seizures, MI, CVA, anxiety and depression who is admitted with volvulus of cecum with ischemia now s/p right hemicolectomy with primary anastomosis 11/13 complicated by post op ileus.  Met with pt in room today. Pt reports good appetite and oral intake at baseline but reports decreased oral intake for a couple of days pta. Pt reports a very regimented meal regimen. Pt reports that she eats 1400kcal/day and specifically mentions how much carbohydrate, fiber, protein and fat she aims for daily. Pt reports that she is extremely active and walks 1-2 miles per day. Pt does not drink any nutritional supplements as she reports that she tries to meet her needs with natural foods. RD discussed with pt the importance of adequate nutrition needed to preserve lean muscle and to support post op healing. RD explained to patient that she will need more than 1400kcal/day to meet her post op needs. Pt is agreeable to try the chocolate Mallie Pinion with diet advancement. Recommended for patient to continue supplements until post op healing is complete.    Pt has remained NPO since admit and it now without adequate nutrition for 5 days.  Plan is for TPN initiation today. PICC line in place. Pt is at high refeed risk. NGT in place with output overnight; pt has been eating ice chips. Pt reports ongoing bowel function. Pt is mobilizing.    Medications reviewed and include: lovenox, insulin, protonix , NaCl @35ml /hr, KPhos, TPN   Labs reviewed: K 3.3(L), P 1.5(L), Mg 2.4 wnl Iron 18(L), TIBC 203(L), folate 9.3 Vitamin D 34.08 wnl Cbgs- 93, 60 x 24 hrs  AIC 5.5  UOP-    NUTRITION - FOCUSED PHYSICAL EXAM:  Flowsheet Row Most Recent Value  Orbital Region Moderate depletion  Upper Arm Region Severe depletion  Thoracic and Lumbar Region Severe depletion  Buccal Region Severe depletion  Temple Region Moderate depletion  Clavicle Bone Region Severe depletion  Clavicle and Acromion Bone Region Severe depletion  Scapular Bone Region Moderate depletion  Dorsal Hand Severe depletion  Patellar Region Moderate depletion  Anterior Thigh Region Moderate depletion  Posterior Calf Region Severe depletion  Edema (RD Assessment) Mild  Hair Reviewed  Eyes Reviewed  Mouth Reviewed  Skin Reviewed  Nails Reviewed   Diet Order:   Diet Order             Diet NPO time specified  Diet effective now                  EDUCATION NEEDS:   Education needs have been addressed  Skin:  Skin Assessment: Reviewed RN Assessment (incision abdomen)  Last BM:  11/19- type 7  Height:   Ht Readings from Last 1 Encounters:  09/29/24 5' 4 (  1.626 m)    Weight:   Wt Readings from Last 1 Encounters:  10/03/24 50.2 kg    Ideal Body Weight:  54.5 kg  BMI:  Body mass index is 19 kg/m.  Estimated Nutritional Needs:   Kcal:  1600-1800kcal/day  Protein:  80-90g/day  Fluid:  1.4-1.6L/day  Augustin Shams MS, RD, LDN If unable to be reached, please send secure chat to RD inpatient available from 8:00a-4:00p daily

## 2024-10-03 NOTE — Consult Note (Signed)
 PHARMACY - TOTAL PARENTERAL NUTRITION CONSULT NOTE   Indication: Prolonged ileus  Patient Measurements: Height: 5' 4 (162.6 cm) Weight: 50.2 kg (110 lb 10.7 oz) IBW/kg (Calculated) : 54.7 TPN AdjBW (KG): 51.8 Body mass index is 19 kg/m.  Assessment:  78yo female admitted with small bowel obstruction and cecal volvulus with gangrene.  s/p right hemicolectomy with primary anastomosis on November/16. PMH includes hypertension, dyslipidemia, and GERD. Pharmacy consulted to initiate TPN after prolonged ileus. Patient NPO for 5 days and unable to advanced diet.  Glucose / Insulin:  Electrolytes: K 3.3, Phos 1.5. All other electrolytes WNL.  Renal: Scr 0.61 Hepatic:  Intake / Output; MIVF: NG @ 75 ml/hr, I/O net -2.7L GI Imaging: 11/18 -  Findings are concerning for small bowel obstruction. Postoperative ileus is also in the differential 11/20 - Persistent gaseous dilated bowel loops compatible with ileus  GI Surgeries / Procedures: 11/16-right hemicolectomy with primary anastomosis  Central access: ordered 11/20 TPN start date: 11/20  Nutritional Goals: Goal TPN rate is 65 mL/hr (provides 85.8 g of protein and 1606 kcals per day)  RD Assessment: Estimated Needs Total Energy Estimated Needs: 1600-1800kcal/day Total Protein Estimated Needs: 80-90g/day Total Fluid Estimated Needs: 1.4-1.6L/day  Current Nutrition:  NPO  Plan:  Replace electrolytes with Kphos 30 mmol IV x 1 dose today at 1000 Start TPN at 40 mL/hr at 1800 Electrolytes in TPN: Na 50mEq/L, K 50mEq/L, Ca 55mEq/L, Mg 56mEq/L, and Phos 15mmol/L. Cl:Ac 1:1 Add standard MVI and trace elements to TPN Add thiamine 100mg  daily to bag x 7 days (Nov/20 - day 1) Initiate Sensitive q6h SSI and adjust as needed  Reduce MIVF to 35 mL/hr at 1800 Monitor TPN labs on Mon/Thurs, daily until stable  Jud Fanguy Rodriguez-Guzman PharmD, BCPS 10/03/2024 8:43 AM

## 2024-10-03 NOTE — Progress Notes (Signed)
 Physical Therapy Treatment Patient Details Name: Anna Castaneda MRN: [id number] DOB: 08/12/1946 Today's Date: 10/03/2024   History of Present Illness Anna Castaneda is 78 y.o. 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 CT scan of the abdomen and pelvis that shows severe dilation of the cecum with concern of volvulus.  She also had severe stomach dilation.  NGT was placed. 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 R hemicolectomy with primary anastomosis completed.    PT Comments  Pt was sitting in recliner upon arrival. She mentioned that she does a lot of traveling. Pt presented as A and O x4 and was eager to do more ambulation. Performed STS transfer from recliner to straight cane with supervision A. Ambulated 1000 ft with supervision A. Determined halfway through ambulation that straight cane was negatively impacting pt's gait and not necessary. Performed stand to sit back into recliner with supervision A. Discharge recs still apply. No acute PT needs. Mobility to continue to advance OOB mobility.   If plan is discharge home, recommend the following: A little help with walking and/or transfers;A little help with bathing/dressing/bathroom   Can travel by private vehicle        Equipment Recommendations  None recommended by PT    Recommendations for Other Services       Precautions / Restrictions Precautions Precautions: Fall Recall of Precautions/Restrictions: Intact Precaution/Restrictions Comments: abd incision, NG tube Restrictions Weight Bearing Restrictions Per Provider Order: No     Mobility  Bed Mobility      Transfers Overall transfer level: Modified independent Equipment used: Straight cane Transfers: Sit to/from Stand Sit to Stand: Supervision            General transfer comment: Supervision A during transfers    Ambulation/Gait Ambulation/Gait assistance: Contact guard assist Gait Distance (Feet): 1000 Feet Assistive device: Straight cane Gait Pattern/deviations: Step-through pattern       General Gait Details: Took cane away as it seemed it wasn't benefitting pt       Balance Overall balance assessment: Modified Independent Sitting-balance support: Feet supported Sitting balance-Leahy Scale: Normal     Standing balance support: Single extremity supported Standing balance-Leahy Scale: Good          Communication Communication Communication: No apparent difficulties  Cognition Arousal: Alert Behavior During Therapy: WFL for tasks assessed/performed   PT - Cognitive impairments: No apparent impairments         Following commands: Intact      Cueing Cueing Techniques: Verbal cues         Pertinent Vitals/Pain Pain Assessment Pain Assessment: 0-10     PT Goals (current goals can now be found in the care plan section) Acute Rehab PT Goals Patient Stated Goal: Pt wants to get back to working out Progress towards PT goals: Progressing toward goals;Goals met/education completed, patient discharged from PT    Frequency    Other (Comment) (Discontinue acute PT. No skilled needs)       AM-PAC PT 6 Clicks Mobility   Outcome Measure  Help needed turning from your back to your side while in a flat bed without using bedrails?: None Help needed moving from lying on your back to sitting on the side of a flat bed without using bedrails?: None Help needed moving to and from a bed to a chair (including a wheelchair)?: None Help needed  standing up from a chair using your arms (e.g., wheelchair or bedside chair)?: None Help needed to walk in hospital room?: None Help needed climbing 3-5 steps with a railing? : A Little 6 Click Score: 23    End of Session   Activity Tolerance: Patient tolerated treatment  well Patient left: in chair;with call bell/phone within reach;with chair alarm set   PT Visit Diagnosis: Difficulty in walking, not elsewhere classified (R26.2);Other abnormalities of gait and mobility (R26.89)     Time: 8386-8371 PT Time Calculation (min) (ACUTE ONLY): 15 min  Charges:    $Gait Training: 8-22 mins PT General Charges $$ ACUTE PT VISIT: 1 Visit                     Signe Kellyn Mansfield SPTA    Quamel Fitzmaurice 10/03/2024, 5:05 PM

## 2024-10-04 ENCOUNTER — Inpatient Hospital Stay

## 2024-10-04 DIAGNOSIS — E43 Unspecified severe protein-calorie malnutrition: Secondary | ICD-10-CM | POA: Insufficient documentation

## 2024-10-04 DIAGNOSIS — K56609 Unspecified intestinal obstruction, unspecified as to partial versus complete obstruction: Secondary | ICD-10-CM | POA: Diagnosis not present

## 2024-10-04 LAB — BASIC METABOLIC PANEL WITH GFR
Anion gap: 9 (ref 5–15)
BUN: 9 mg/dL (ref 8–23)
CO2: 28 mmol/L (ref 22–32)
Calcium: 7.5 mg/dL — ABNORMAL LOW (ref 8.9–10.3)
Chloride: 103 mmol/L (ref 98–111)
Creatinine, Ser: 0.53 mg/dL (ref 0.44–1.00)
GFR, Estimated: >60 mL/min (ref >60)
Glucose, Bld: 164 mg/dL — ABNORMAL HIGH (ref 70–99)
Potassium: 3.5 mmol/L (ref 3.5–5.1)
Sodium: 139 mmol/L (ref 135–145)

## 2024-10-04 LAB — GLUCOSE, CAPILLARY
Glucose-Capillary: 167 mg/dL — ABNORMAL HIGH (ref 70–99)
Glucose-Capillary: 171 mg/dL — ABNORMAL HIGH (ref 70–99)
Glucose-Capillary: 168 mg/dL — ABNORMAL HIGH (ref 70–99)
Glucose-Capillary: 158 mg/dL — ABNORMAL HIGH (ref 70–99)

## 2024-10-04 LAB — CBC
HCT: 34.8 % — ABNORMAL LOW (ref 36.0–46.0)
Hemoglobin: 12.1 g/dL (ref 12.0–15.0)
MCH: 31.7 pg (ref 26.0–34.0)
MCHC: 34.8 g/dL (ref 30.0–36.0)
MCV: 91.1 fL (ref 80.0–100.0)
Platelets: 312 K/uL (ref 150–400)
RBC: 3.82 MIL/uL — ABNORMAL LOW (ref 3.87–5.11)
RDW: 14.6 % (ref 11.5–15.5)
WBC: 7.2 K/uL (ref 4.0–10.5)
nRBC: 0 % (ref 0.0–0.2)

## 2024-10-04 LAB — MAGNESIUM: Magnesium: 2.2 mg/dL (ref 1.7–2.4)

## 2024-10-04 LAB — PHOSPHORUS: Phosphorus: 1.6 mg/dL — ABNORMAL LOW (ref 2.5–4.6)

## 2024-10-04 MED ORDER — TRAVASOL 10 % IV SOLN
INTRAVENOUS | Status: AC
  Filled 2024-10-04: qty 858

## 2024-10-04 MED ORDER — IOHEXOL 300 MG/ML  SOLN
80.0000 mL | Freq: Once | INTRAMUSCULAR | Status: AC | PRN
  Administered 2024-10-04: 80 mL via INTRAVENOUS

## 2024-10-04 MED ORDER — POTASSIUM PHOSPHATES 15 MMOLE/5ML IV SOLN
15.0000 mmol | Freq: Once | INTRAVENOUS | Status: AC
  Administered 2024-10-04: 15 mmol via INTRAVENOUS
  Filled 2024-10-04: qty 5

## 2024-10-04 MED ORDER — INSULIN ASPART 100 UNIT/ML IJ SOLN
0.0000 [IU] | Freq: Four times a day (QID) | INTRAMUSCULAR | Status: DC
  Administered 2024-10-04 – 2024-10-05 (×5): 2 [IU] via SUBCUTANEOUS
  Administered 2024-10-06 (×2): 1 [IU] via SUBCUTANEOUS
  Filled 2024-10-04: qty 1
  Filled 2024-10-04 (×4): qty 2
  Filled 2024-10-04: qty 1

## 2024-10-04 MED ORDER — IOHEXOL 9 MG/ML PO SOLN
500.0000 mL | ORAL | Status: AC
Start: 2024-10-04 — End: 2024-10-04
  Administered 2024-10-04 (×2): 500 mL via ORAL

## 2024-10-04 NOTE — Progress Notes (Signed)
 Progress Note   Patient: Anna Castaneda FMW:[id number] DOB: [birth year] DOA: 09/29/2024     5 DOS: the patient was seen and examined on 10/04/2024   Brief hospital course: Partly taken from prior notes.   Anna Castaneda is a 78 y.o. female with medical history significant of HTN, HLD, CVA, chronic hyponatremia, anxiety/depression, MI, coming in with worsening of abdominal pain and feeling nausea and bloating.  No history of significant abdominal surgeries.  On presentation hemodynamically stable, labs pertinent for hyponatremia with sodium at 121, CT abdomen and pelvis with concern of SBO, markedly distended stomach and suspicious for atypical cecal volvulus.  General surgery was consulted, continue to get worse abdominal distention and pain despite having NG tube so she was taken to the OR s/p right hemicolectomy with primary anastomosis.  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  11/17: Vitals and hemoglobin stable, sodium at 126, seems like having chronic hyponatremia.  Hyponatremia labs with hypotonic hyponatremia, urine osmolality was normal with urine sodium of 66. Still no bowel function and NG tube with significant amount of bilious secretions.  11/18: Hemodynamically stable with improving sodium now at 131, still no bowel function.  Surgery is planning Gastrografin  studies.  Assessment and Plan:  # SBO (small bowel obstruction) 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 Patient still no bowel function and significant bilious secretions with NG tube. General surgery is on board. -11/18 s/p Gastrografin  studies, possible ileus versus SBO -Continue with IV fluid -Continue with supportive care -PT and OT evaluation 11/19 NG tube clamped, x-ray ordered and started on clear liquid diet as per general surgery. 11/20 postop ileus, started TPN, n.p.o. and continued NG tube with intermittent suction as per general surgery. 11/21 xray kub:  Persistent ileus and pneumoperitoneum-postop   # Hyponatremia: Resolved Seems like acute on chronic hyponatremia.  Na 141 -Continue with normal saline -Monitor sodium  # Hypokalemia, potassium repleted. # Hypophosphatemia, Phos repleted. Monitor electrolytes and replete as needed.  # Hypoglycemia on 11/20 Follow hypoglycemia protocol Started TPN by general surgery  Hypertension Blood pressure currently within goal. -Holding home lisinopril as patient is n.p.o. -As needed IV hydralazine   Hyperlipidemia - Holding home statin as she is n.p.o.  GERD (gastroesophageal reflux disease) - On IV Protonix   # Iron deficiency, Tsat 9% Start oral iron supplement with vitamin C on discharge Follow-up with PCP to repeat iron profile after 3 to 6 months  11/20 s/p PICC line inserted for TPN  Subjective: No significant overnight events.  Patient stated that she had a BM x 1 last night and also passing gas, pain is under control.  Denied any active issues.  Patient was ambulating well in the hallway.     Physical Exam: Vitals:   10/03/24 1554 10/03/24 2002 10/04/24 0337 10/04/24 0757  BP: (!) 152/82 (!) 153/77 132/63 128/71  Pulse: 81 80 69 76  Resp: 18 16 16 18   Temp: 97.6 F (36.4 C) 97.9 F (36.6 C) 98.5 F (36.9 C) 97.6 F (36.4 C)  TempSrc:  Oral Oral Oral  SpO2: 99% 98% 95% 100%  Weight:   50 kg   Height:       General.  Malnourished elderly lady, in no acute distress.  NG tube in place. Pulmonary.  Lungs clear bilaterally, normal respiratory effort. CV.  Regular rate and rhythm, No murmur. Abdomen.  BS positive, Mildly distended, mild diffuse tenderness CNS.  Alert and oriented .  No focal neurologic deficit. Extremities.  No edema  Psychiatry.  Judgment and insight appears normal.   Data Reviewed:  Prior data reviewed  Family Communication: Discussed with patient and a family friend at bedside  Disposition: Status is: Inpatient Remains inpatient appropriate  because: Severity of illness  Planned Discharge Destination: To be determined  DVT prophylaxis.  Lovenox  Time spent: 40 minutes  This record has been created using Conservation officer, historic buildings. Errors have been sought and corrected,but may not always be located. Such creation errors do not reflect on the standard of care.   Author: Elvan Sor, MD 10/04/2024 2:14 PM  For on call review [email or web address].

## 2024-10-04 NOTE — Consult Note (Signed)
 PHARMACY - TOTAL PARENTERAL NUTRITION CONSULT NOTE   Indication: Prolonged ileus  Patient Measurements: Height: 5' 4 (162.6 cm) Weight: 50 kg (110 lb 3.7 oz) IBW/kg (Calculated) : 54.7 TPN AdjBW (KG): 51.8 Body mass index is 18.92 kg/m.  Assessment:  78yo female admitted with small bowel obstruction and cecal volvulus with gangrene.  s/p right hemicolectomy with primary anastomosis on November/16. PMH includes hypertension, dyslipidemia, and GERD. Pharmacy consulted to initiate TPN after prolonged ileus. Patient NPO for 5 days and unable to advanced diet.  Glucose / Insulin : BG range 80-167 in last 12 hours and patient refused Ssi 2 units of insulin  this morning. Plan to monitor BG fro 24 hrs and add insulin  to TPN bag if patient continues to refuse and insulin  is needed.  Electrolytes: K 3.5, Phos 1.6. All other electrolytes WNL.  Renal: Scr 0.53 Hepatic:  Intake / Output; MIVF: NG @ 75 ml/hr, I/O net -2.7L GI Imaging: 11/18 -  Findings are concerning for small bowel obstruction. Postoperative ileus is also in the differential 11/20 - Persistent gaseous dilated bowel loops compatible with ileus  GI Surgeries / Procedures: 11/16-right hemicolectomy with primary anastomosis  Central access: 11/20 TPN start date: 11/20  Nutritional Goals: Goal TPN rate is 65 mL/hr (provides 85.8 g of protein and 1606 kcals per day)  RD Assessment: Estimated Needs Total Energy Estimated Needs: 1600-1800kcal/day Total Protein Estimated Needs: 80-90g/day Total Fluid Estimated Needs: 1.4-1.6L/day  Current Nutrition: NPO  Plan:  Replace electrolytes with Kphos 15 mmol IV x 1 dose today at 1000 Increase TPN to target rate of  65 mL/hr at 1800 Electrolytes in TPN: Na 71mEq/L, K 51mEq/L, Ca 32mEq/L, Mg 2mEq/L, and Phos 15mmol/L. Cl:Ac 1:1 Add standard MVI and trace elements to TPN Add thiamine 100mg  daily to bag x 7 days (Nov/21 - day 2) Initiate Sensitive q6h SSI and adjust as needed   MIVF to  stop today at 1759 Monitor TPN labs on Mon/Thurs, daily until stable  Fintan Grater Rodriguez-Guzman PharmD, BCPS 10/04/2024 8:07 AM

## 2024-10-04 NOTE — Plan of Care (Signed)

## 2024-10-04 NOTE — Progress Notes (Signed)
 Mobility Specialist - Progress Note   10/04/24 1032  Mobility  Activity Ambulated independently  Level of Assistance Independent  Assistive Device None  Distance Ambulated (ft) 1280 ft  Activity Response Tolerated well  Mobility visit 1 Mobility  Mobility Specialist Start Time (ACUTE ONLY) 1013  Mobility Specialist Stop Time (ACUTE ONLY) 1030  Mobility Specialist Time Calculation (min) (ACUTE ONLY) 17 min   Pt sitting in the recliner upon entry, motivated and agreeble to amb. Pt amb 8 laps around the NS, tolerated well. She returned to the room, left seated in the recliner with needs within reach.  America Silvan Mobility Specialist 10/04/24 10:38 AM

## 2024-10-04 NOTE — Progress Notes (Signed)
 Patient ID: Kajuana Shareef, female   DOB: 05-28-1946, 78 y.o.   MRN: [id number]     SURGICAL PROGRESS NOTE   Hospital Day(s): 5.   Interval History: Patient seen and examined, no acute events or new complaints overnight. Patient reports feeling happy because she had another bowel movement.  She continues deny any abdominal pain.  Vital signs in last 24 hours: [min-max] current  Temp:  [97.6 F (36.4 C)-98.5 F (36.9 C)] 98.5 F (36.9 C) (11/21 0337) Pulse Rate:  [69-81] 69 (11/21 0337) Resp:  [16-18] 16 (11/21 0337) BP: (132-153)/(63-82) 132/63 (11/21 0337) SpO2:  [95 %-100 %] 95 % (11/21 0337) Weight:  [50 kg] 50 kg (11/21 0337)     Height: 5' 4 (162.6 cm) Weight: 50 kg BMI (Calculated): 18.91   Physical Exam:  Constitutional: alert, cooperative and no distress  Respiratory: breathing non-labored at rest  Cardiovascular: regular rate and sinus rhythm  Gastrointestinal: soft, non-tender, but still distended  Labs:     Latest Ref Rng & Units 10/04/2024    3:30 AM 10/03/2024    5:44 AM 10/01/2024    4:04 AM  CBC  WBC 4.0 - 10.5 K/uL 7.2  5.6  7.5   Hemoglobin 12.0 - 15.0 g/dL 87.8  88.3  87.8   Hematocrit 36.0 - 46.0 % 34.8  34.5  34.4   Platelets 150 - 400 K/uL 312  342  286       Latest Ref Rng & Units 10/04/2024    3:30 AM 10/03/2024    5:44 AM 10/01/2024    4:04 AM  CMP  Glucose 70 - 99 mg/dL 835  67  71   BUN 8 - 23 mg/dL 9  11  12    Creatinine 0.44 - 1.00 mg/dL 9.46  9.38  9.17   Sodium 135 - 145 mmol/L 139  141  131   Potassium 3.5 - 5.1 mmol/L 3.5  3.3  3.7   Chloride 98 - 111 mmol/L 103  106  97   CO2 22 - 32 mmol/L 28  23  23    Calcium  8.9 - 10.3 mg/dL 7.5  7.5  7.8     Imaging studies: No new pertinent imaging studies   Assessment/Plan:  Cecal volvulus with gangrene - S/p right hemicolectomy postop day #5 - Continue with postop ileus.  Despite having another small bowel movement, abdominal exam with distention and tympanic.  Will repeat abdominal x-ray  this morning.  If there is persistent ileus versus obstruction will repeat CT scan with contrast. -Continue NGT to low intermittent suction -Will continue TPN for IV nutrition - Keep electrolytes within normal limits - Pain management - Encourage patient to ambulate.   Lucas Petrin, MD

## 2024-10-05 DIAGNOSIS — K56609 Unspecified intestinal obstruction, unspecified as to partial versus complete obstruction: Secondary | ICD-10-CM | POA: Diagnosis not present

## 2024-10-05 LAB — CBC
HCT: 34.1 % — ABNORMAL LOW (ref 36.0–46.0)
Hemoglobin: 11.9 g/dL — ABNORMAL LOW (ref 12.0–15.0)
MCH: 31.3 pg (ref 26.0–34.0)
MCHC: 34.9 g/dL (ref 30.0–36.0)
MCV: 89.7 fL (ref 80.0–100.0)
Platelets: 298 K/uL (ref 150–400)
RBC: 3.8 MIL/uL — ABNORMAL LOW (ref 3.87–5.11)
RDW: 14.5 % (ref 11.5–15.5)
WBC: 5.8 K/uL (ref 4.0–10.5)
nRBC: 0 % (ref 0.0–0.2)

## 2024-10-05 LAB — BASIC METABOLIC PANEL WITH GFR
Anion gap: 7 (ref 5–15)
BUN: 11 mg/dL (ref 8–23)
CO2: 28 mmol/L (ref 22–32)
Calcium: 7.7 mg/dL — ABNORMAL LOW (ref 8.9–10.3)
Chloride: 100 mmol/L (ref 98–111)
Creatinine, Ser: 0.43 mg/dL — ABNORMAL LOW (ref 0.44–1.00)
GFR, Estimated: >60 mL/min (ref >60)
Glucose, Bld: 146 mg/dL — ABNORMAL HIGH (ref 70–99)
Potassium: 3.6 mmol/L (ref 3.5–5.1)
Sodium: 135 mmol/L (ref 135–145)

## 2024-10-05 LAB — GLUCOSE, CAPILLARY
Glucose-Capillary: 126 mg/dL — ABNORMAL HIGH (ref 70–99)
Glucose-Capillary: 155 mg/dL — ABNORMAL HIGH (ref 70–99)
Glucose-Capillary: 173 mg/dL — ABNORMAL HIGH (ref 70–99)
Glucose-Capillary: 185 mg/dL — ABNORMAL HIGH (ref 70–99)

## 2024-10-05 LAB — MAGNESIUM: Magnesium: 2.1 mg/dL (ref 1.7–2.4)

## 2024-10-05 LAB — PHOSPHORUS: Phosphorus: 1.9 mg/dL — ABNORMAL LOW (ref 2.5–4.6)

## 2024-10-05 MED ORDER — POTASSIUM PHOSPHATES 15 MMOLE/5ML IV SOLN
21.0000 mmol | Freq: Once | INTRAVENOUS | Status: AC
  Administered 2024-10-05: 21 mmol via INTRAVENOUS
  Filled 2024-10-05: qty 7

## 2024-10-05 MED ORDER — TRAVASOL 10 % IV SOLN
INTRAVENOUS | Status: AC
  Filled 2024-10-05: qty 858

## 2024-10-05 NOTE — Progress Notes (Signed)
 Progress Note   Patient: Anna Castaneda DOB: Aug 19, 1946 DOA: 09/29/2024     6 DOS: the patient was seen and examined on 10/05/2024   Brief hospital course: Partly taken from prior notes.   Anna Castaneda is a 78 y.o. female with medical history significant of HTN, HLD, CVA, chronic hyponatremia, anxiety/depression, MI, coming in with worsening of abdominal pain and feeling nausea and bloating.  No history of significant abdominal surgeries.  On presentation hemodynamically stable, labs pertinent for hyponatremia with sodium at 121, CT abdomen and pelvis with concern of SBO, markedly distended stomach and suspicious for atypical cecal volvulus.  General surgery was consulted, continue to get worse abdominal distention and pain despite having NG tube so she was taken to the OR s/p right hemicolectomy with primary anastomosis.  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  11/17: Vitals and hemoglobin stable, sodium at 126, seems like having chronic hyponatremia.  Hyponatremia labs with hypotonic hyponatremia, urine osmolality was normal with urine sodium of 66. Still no bowel function and NG tube with significant amount of bilious secretions.  11/18: Hemodynamically stable with improving sodium now at 131, still no bowel function.  Surgery is planning Gastrografin  studies.  Assessment and Plan:  # SBO (small bowel obstruction) 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 Patient still no bowel function and significant bilious secretions with NG tube. General surgery is on board. -11/18 s/p Gastrografin  studies, possible ileus versus SBO -Continue with IV fluid -Continue with supportive care -PT and OT evaluation 11/19 NG tube clamped, x-ray ordered and started on clear liquid diet as per general surgery. 11/20 postop ileus, started TPN, n.p.o. and continued NG tube with intermittent suction as per general surgery. 11/21 xray kub:  Persistent ileus and pneumoperitoneum-postop 11/22 started clear liquid diet by general surgery.  NG tube clamp trial today  # Hyponatremia: Resolved Seems like acute on chronic hyponatremia.  Na 141 -Continue with normal saline -Monitor sodium  # Hypokalemia, potassium repleted. # Hypophosphatemia, Phos repleted. Monitor electrolytes and replete as needed.  # Hypoglycemia on 11/20 Follow hypoglycemia protocol Started TPN by general surgery  Hypertension Blood pressure currently within goal. -Holding home lisinopril as patient is n.p.o. -As needed IV hydralazine   Hyperlipidemia - Holding home statin as she is n.p.o.  GERD (gastroesophageal reflux disease) - On IV Protonix   # Iron deficiency, Tsat 9% Start oral iron supplement with vitamin C on discharge Follow-up with PCP to repeat iron profile after 3 to 6 months  11/20 s/p PICC line inserted for TPN  Subjective: No significant overnight events.  Patient denied any abdominal pain, moving bowels, 2 small BM today.  Denied any other complaints.  Tolerated clear liquid diet well.    Physical Exam: Vitals:   10/04/24 1945 10/05/24 0346 10/05/24 0536 10/05/24 0837  BP: (!) 148/79 (!) 141/78  127/77  Pulse: 77 75  84  Resp: 17 17  18   Temp: 97.7 F (36.5 C) 97.8 F (36.6 C)  98.4 F (36.9 C)  TempSrc:    Oral  SpO2: 100% 96%  99%  Weight:   50 kg   Height:       General.  Malnourished elderly lady, in no acute distress.  NG tube in place. Pulmonary.  Lungs clear bilaterally, normal respiratory effort. CV.  Regular rate and rhythm, No murmur. Abdomen.  BS positive, Mildly distended, No tenderness CNS.  Alert and oriented .  No focal neurologic deficit. Extremities.  No edema Psychiatry.  Judgment and insight appears normal.   Data Reviewed:  Prior data reviewed  Family Communication: Discussed with patient and a family friend at bedside  Disposition: Status is: Inpatient Remains inpatient appropriate  because: Severity of illness  Planned Discharge Destination: To be determined  DVT prophylaxis.  Lovenox  Time spent: 40 minutes  This record has been created using Conservation officer, historic buildings. Errors have been sought and corrected,but may not always be located. Such creation errors do not reflect on the standard of care.   Author: Elvan Sor, MD 10/05/2024 1:38 PM  For on call review [email or web address].

## 2024-10-05 NOTE — Plan of Care (Signed)

## 2024-10-05 NOTE — Progress Notes (Signed)
 Mobility Specialist Progress Note:    10/05/24 1611  Mobility  Activity Ambulated with assistance  Level of Assistance Standby assist, set-up cues, supervision of patient - no hands on  Assistive Device None  Distance Ambulated (ft) 1000 ft  Range of Motion/Exercises Active;All extremities  Activity Response Tolerated well  Mobility visit 1 Mobility  Mobility Specialist Start Time (ACUTE ONLY) 1556  Mobility Specialist Stop Time (ACUTE ONLY) 1611  Mobility Specialist Time Calculation (min) (ACUTE ONLY) 15 min   Pt received in chair, eager for mobility. Ambulated with SBA to medical mall and back. Tolerated well, asx throughout. Returned to room, left pt in chair with belongings in reach. All needs met.  Sherrilee Ditty Mobility Specialist Please contact via Special Educational Needs Teacher or  Rehab office at [phone]

## 2024-10-05 NOTE — Consult Note (Signed)
 PHARMACY - TOTAL PARENTERAL NUTRITION CONSULT NOTE   Indication: Prolonged ileus  Patient Measurements: Height: 5' 4 (162.6 cm) Weight: 50 kg (110 lb 3.7 oz) IBW/kg (Calculated) : 54.7 TPN AdjBW (KG): 51.8 Body mass index is 18.92 kg/m.  Assessment:  78yo female admitted with small bowel obstruction and cecal volvulus with gangrene.  s/p right hemicolectomy with primary anastomosis on November/16. PMH includes hypertension, dyslipidemia, and GERD. Pharmacy consulted to initiate TPN after prolonged ileus. Patient NPO for 5 days and unable to advanced diet.  Glucose / Insulin : 8u SSI last 24h and CBGs are at goal Electrolytes: Mild hypophosphatemia Renal: Scr ~ 0.5 Hepatic: Within normal limits Intake / Output; MIVF: I&O -1.9L GI Imaging: 11/18 -  Findings are concerning for small bowel obstruction. Postoperative ileus is also in the differential 11/20 - Persistent gaseous dilated bowel loops compatible with ileus 11/21 CTAP: Mildly dilated loops of small bowel concerning for ileus GI Surgeries / Procedures: 11/16-right hemicolectomy with primary anastomosis  Central access: 11/20 TPN start date: 11/20  Nutritional Goals: Goal TPN rate is 65 mL/hr (provides 85.8 g of protein and 1606 kcals per day)  RD Assessment: Estimated Needs Total Energy Estimated Needs: 1600-1800kcal/day Total Protein Estimated Needs: 80-90g/day Total Fluid Estimated Needs: 1.4-1.6L/day  Current Nutrition: NPO  Plan:  Continue TPN to target rate of  65 mL/hr at 1800 Electrolytes in TPN: Na 41mEq/L, K 50mEq/L, Ca 71mEq/L, Mg 39mEq/L, and Phos 15mmol/L. Cl:Ac 1:1 Give potassium phosphate  21 mmol IV x 1 Add standard MVI and trace elements to TPN Add thiamine 100mg  daily to bag x 7 days (Nov/22 - day 3) Continue Sensitive q6h SSI and adjust as needed  Monitor TPN labs on Mon/Thurs, daily until stable  Marolyn KATHEE Mare 10/05/2024 7:20 AM

## 2024-10-05 NOTE — Plan of Care (Signed)

## 2024-10-05 NOTE — Anesthesia Postprocedure Evaluation (Signed)
 Anesthesia Post Note  Patient: Kaytelyn Glore  Procedure(s) Performed: LAPAROTOMY, EXPLORATORY with COLECTOMY (Abdomen) APPLICATION, WOUND VAC (Abdomen)  Patient location during evaluation: PACU Anesthesia Type: General Level of consciousness: awake and alert Pain management: pain level controlled Vital Signs Assessment: post-procedure vital signs reviewed and stable Respiratory status: spontaneous breathing, nonlabored ventilation, respiratory function stable and patient connected to nasal cannula oxygen Cardiovascular status: blood pressure returned to baseline and stable Postop Assessment: no apparent nausea or vomiting Anesthetic complications: no   No notable events documented.   Last Vitals:  Vitals:   10/04/24 1945 10/05/24 0346  BP: (!) 148/79 (!) 141/78  Pulse: 77 75  Resp: 17 17  Temp: 36.5 C 36.6 C  SpO2: 100% 96%    Last Pain:  Vitals:   10/04/24 1945  TempSrc:   PainSc: 0-No pain                 Prentice Murphy

## 2024-10-05 NOTE — Progress Notes (Signed)
 Mobility Specialist Progress Note:    10/05/24 9187  Mobility  Activity Ambulated with assistance  Level of Assistance Standby assist, set-up cues, supervision of patient - no hands on  Assistive Device None  Distance Ambulated (ft) 960 ft  Range of Motion/Exercises Active;All extremities  Activity Response Tolerated well  Mobility visit 1 Mobility  Mobility Specialist Start Time (ACUTE ONLY) 0754  Mobility Specialist Stop Time (ACUTE ONLY) [id number]  Mobility Specialist Time Calculation (min) (ACUTE ONLY) 18 min   Pt received in chair, eager for mobility. Required SBA to stand and ambulate with no AD. Tolerated well, c/o feeling weak today. Returned to room, left pt in chair. Belongings in reach, all needs met.  Sherrilee Ditty Mobility Specialist Please contact via Special Educational Needs Teacher or  Rehab office at [phone]

## 2024-10-05 NOTE — Progress Notes (Signed)
 Patient ID: Anna Castaneda, female   DOB: 1946/08/22, 78 y.o.   MRN: [id number]     SURGICAL PROGRESS NOTE   Hospital Day(s): 6.   Interval History: Patient seen and examined, no acute events or new complaints overnight. Patient reports feeling well.  She denies any nausea or vomiting.  She endorses had a small bowel movement this morning.  NGT output continued decreasing trend despite patient getting ice chips  Vital signs in last 24 hours: [min-max] current  Temp:  [97.7 F (36.5 C)-98.4 F (36.9 C)] 98.4 F (36.9 C) (11/22 0837) Pulse Rate:  [74-84] 84 (11/22 0837) Resp:  [17-18] 18 (11/22 0837) BP: (127-152)/(71-79) 127/77 (11/22 0837) SpO2:  [96 %-100 %] 99 % (11/22 0837) Weight:  [50 kg] 50 kg (11/22 0536)     Height: 5' 4 (162.6 cm) Weight: 50 kg BMI (Calculated): 18.91   Physical Exam:  Constitutional: alert, cooperative and no distress  Respiratory: breathing non-labored at rest  Cardiovascular: regular rate and sinus rhythm  Gastrointestinal: soft, non-tender, and mild-distended  Labs:     Latest Ref Rng & Units 10/05/2024    3:49 AM 10/04/2024    3:30 AM 10/03/2024    5:44 AM  CBC  WBC 4.0 - 10.5 K/uL 5.8  7.2  5.6   Hemoglobin 12.0 - 15.0 g/dL 88.0  87.8  88.3   Hematocrit 36.0 - 46.0 % 34.1  34.8  34.5   Platelets 150 - 400 K/uL 298  312  342       Latest Ref Rng & Units 10/05/2024    3:49 AM 10/04/2024    3:30 AM 10/03/2024    5:44 AM  CMP  Glucose 70 - 99 mg/dL 853  835  67   BUN 8 - 23 mg/dL 11  9  11    Creatinine 0.44 - 1.00 mg/dL 9.56  9.46  9.38   Sodium 135 - 145 mmol/L 135  139  141   Potassium 3.5 - 5.1 mmol/L 3.6  3.5  3.3   Chloride 98 - 111 mmol/L 100  103  106   CO2 22 - 32 mmol/L 28  28  23    Calcium  8.9 - 10.3 mg/dL 7.7  7.5  7.5     Imaging studies: Abdominal x-ray shows improved small bowel dilation.  CT scan of the abdomen pelvis yesterday with oral contrast shows all contrast through the large intestine.  There was mild small bowel  dilation consistent with slow transit.  No sign of obstruction.  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  Abdominal exam with softer abdomen.  Still mild distention -Will try to clamp NGT today and give clear liquid diet trial -Will continue TPN for IV nutrition until we know that patient is tolerating adequate nutrition - Keep electrolytes within normal limits - Pain management - Encourage patient to ambulate.  Lucas Petrin, MD

## 2024-10-06 DIAGNOSIS — K56609 Unspecified intestinal obstruction, unspecified as to partial versus complete obstruction: Secondary | ICD-10-CM | POA: Diagnosis not present

## 2024-10-06 LAB — CBC
HCT: 34.4 % — ABNORMAL LOW (ref 36.0–46.0)
Hemoglobin: 12.1 g/dL (ref 12.0–15.0)
MCH: 31.6 pg (ref 26.0–34.0)
MCHC: 35.2 g/dL (ref 30.0–36.0)
MCV: 89.8 fL (ref 80.0–100.0)
Platelets: 278 K/uL (ref 150–400)
RBC: 3.83 MIL/uL — ABNORMAL LOW (ref 3.87–5.11)
RDW: 14.6 % (ref 11.5–15.5)
WBC: 6.1 K/uL (ref 4.0–10.5)
nRBC: 0 % (ref 0.0–0.2)

## 2024-10-06 LAB — GLUCOSE, CAPILLARY
Glucose-Capillary: 110 mg/dL — ABNORMAL HIGH (ref 70–99)
Glucose-Capillary: 118 mg/dL — ABNORMAL HIGH (ref 70–99)
Glucose-Capillary: 130 mg/dL — ABNORMAL HIGH (ref 70–99)
Glucose-Capillary: 144 mg/dL — ABNORMAL HIGH (ref 70–99)

## 2024-10-06 LAB — BASIC METABOLIC PANEL WITH GFR
Anion gap: 7 (ref 5–15)
BUN: 16 mg/dL (ref 8–23)
CO2: 25 mmol/L (ref 22–32)
Calcium: 8.4 mg/dL — ABNORMAL LOW (ref 8.9–10.3)
Chloride: 103 mmol/L (ref 98–111)
Creatinine, Ser: 0.5 mg/dL (ref 0.44–1.00)
GFR, Estimated: >60 mL/min (ref >60)
Glucose, Bld: 130 mg/dL — ABNORMAL HIGH (ref 70–99)
Potassium: 4.4 mmol/L (ref 3.5–5.1)
Sodium: 135 mmol/L (ref 135–145)

## 2024-10-06 LAB — PHOSPHORUS: Phosphorus: 3.2 mg/dL (ref 2.5–4.6)

## 2024-10-06 LAB — MAGNESIUM: Magnesium: 2 mg/dL (ref 1.7–2.4)

## 2024-10-06 MED ORDER — PANTOPRAZOLE SODIUM 40 MG PO TBEC
40.0000 mg | DELAYED_RELEASE_TABLET | Freq: Every day | ORAL | Status: DC
  Administered 2024-10-07: 40 mg via ORAL
  Filled 2024-10-06: qty 1

## 2024-10-06 NOTE — Progress Notes (Signed)
 Patient ID: Myrakle Wingler, female   DOB: [birth year], 78 y.o.   MRN: [id number]     SURGICAL PROGRESS NOTE   Hospital Day(s): 7.   Interval History: Patient seen and examined, no acute events or new complaints overnight. Patient reports feeling wonderful.  She endorses that everything is moving the right way.  She feels that her belly is much better.  She was able to tolerate full liquids.  She is asking for solid diet today.  Vital signs in last 24 hours: [min-max] current  Temp:  [97.4 F (36.3 C)-98.4 F (36.9 C)] 97.9 F (36.6 C) (11/23 0539) Pulse Rate:  [69-84] 69 (11/23 0539) Resp:  [16-18] 16 (11/23 0539) BP: (99-130)/(65-77) 118/69 (11/23 0539) SpO2:  [98 %-100 %] 98 % (11/23 0539) Weight:  [49 kg] 49 kg (11/23 0539)     Height: 5' 4 (162.6 cm) Weight: 49 kg BMI (Calculated): 18.53   Physical Exam:  Constitutional: alert, cooperative and no distress  Respiratory: breathing non-labored at rest  Cardiovascular: regular rate and sinus rhythm  Gastrointestinal: soft, non-tender, and mild-distended  Labs:     Latest Ref Rng & Units 10/06/2024    3:47 AM 10/05/2024    3:49 AM 10/04/2024    3:30 AM  CBC  WBC 4.0 - 10.5 K/uL 6.1  5.8  7.2   Hemoglobin 12.0 - 15.0 g/dL 87.8  88.0  87.8   Hematocrit 36.0 - 46.0 % 34.4  34.1  34.8   Platelets 150 - 400 K/uL 278  298  312       Latest Ref Rng & Units 10/06/2024    3:47 AM 10/05/2024    3:49 AM 10/04/2024    3:30 AM  CMP  Glucose 70 - 99 mg/dL 869  853  835   BUN 8 - 23 mg/dL 16  11  9    Creatinine 0.44 - 1.00 mg/dL 9.49  9.56  9.46   Sodium 135 - 145 mmol/L 135  135  139   Potassium 3.5 - 5.1 mmol/L 4.4  3.6  3.5   Chloride 98 - 111 mmol/L 103  100  103   CO2 22 - 32 mmol/L 25  28  28    Calcium  8.9 - 10.3 mg/dL 8.4  7.7  7.5     Imaging studies: No new pertinent imaging studies   Assessment/Plan:  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  Abdominal exam with  softer abdomen.  Still mild distention -She tolerated clamp trial and liquid diet.  She was advanced to full liquid last night.  She is asking for solid diet this morning.  Will advance to soft diet. -Will discontinue TPN today - Keep electrolytes within normal limits - Pain management - Encourage patient to ambulate.  Lucas Petrin, MD

## 2024-10-06 NOTE — Progress Notes (Signed)
 Mobility Specialist Progress Note:    10/06/24 0819  Mobility  Activity Ambulated with assistance  Level of Assistance Standby assist, set-up cues, supervision of patient - no hands on  Assistive Device None  Distance Ambulated (ft) 1000 ft  Range of Motion/Exercises Active;All extremities  Activity Response Tolerated well  Mobility visit 1 Mobility  Mobility Specialist Start Time (ACUTE ONLY) 0757  Mobility Specialist Stop Time (ACUTE ONLY) [id number]  Mobility Specialist Time Calculation (min) (ACUTE ONLY) 22 min   Pt received in bed, eager for mobility. Required supervision to stand and ambulate with no AD. Tolerated well, asx throughout. Returned to room, left pt in chair. All needs met.  Sherrilee Ditty Mobility Specialist Please contact via Special Educational Needs Teacher or  Rehab office at [phone]

## 2024-10-06 NOTE — Progress Notes (Signed)
 PHARMACIST - PHYSICIAN COMMUNICATION  DR:   Von  CONCERNING: IV to Oral Route Change Policy  RECOMMENDATION: This patient is receiving pantoprazole  by the intravenous route.  Based on criteria approved by the Pharmacy and Therapeutics Committee, the intravenous medication(s) is/are being converted to the equivalent oral dose form(s).   DESCRIPTION: These criteria include: The patient is eating (either orally or via tube) and/or has been taking other orally administered medications for a least 24 hours The patient has no evidence of active gastrointestinal bleeding or impaired GI absorption (gastrectomy, short bowel, patient on TNA or NPO).  If you have questions about this conversion, please contact the Pharmacy Department   Kayla JULIANNA Blew, [facility] 10/06/2024 11:48 AM

## 2024-10-06 NOTE — Progress Notes (Signed)
 Progress Note   Patient: Anna Castaneda FMW:[id number] DOB: 1946/04/28 DOA: 09/29/2024     7 DOS: the patient was seen and examined on 10/06/2024   Brief hospital course: Partly taken from prior notes.   Anna Castaneda is a 78 y.o. female with medical history significant of HTN, HLD, CVA, chronic hyponatremia, anxiety/depression, MI, coming in with worsening of abdominal pain and feeling nausea and bloating.  No history of significant abdominal surgeries.  On presentation hemodynamically stable, labs pertinent for hyponatremia with sodium at 121, CT abdomen and pelvis with concern of SBO, markedly distended stomach and suspicious for atypical cecal volvulus.  General surgery was consulted, continue to get worse abdominal distention and pain despite having NG tube so she was taken to the OR s/p right hemicolectomy with primary anastomosis.  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  11/17: Vitals and hemoglobin stable, sodium at 126, seems like having chronic hyponatremia.  Hyponatremia labs with hypotonic hyponatremia, urine osmolality was normal with urine sodium of 66. Still no bowel function and NG tube with significant amount of bilious secretions.  11/18: Hemodynamically stable with improving sodium now at 131, still no bowel function.  Surgery is planning Gastrografin  studies.  Assessment and Plan:  # SBO (small bowel obstruction) 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 Patient still no bowel function and significant bilious secretions with NG tube. General surgery is on board. -11/18 s/p Gastrografin  studies, possible ileus versus SBO -s/p IV fluid -PT and OT evaluation 11/19 NG tube clamped, x-ray ordered and started on clear liquid diet as per general surgery. 11/20 postop ileus, started TPN, n.p.o. and continued NG tube with intermittent suction as per general surgery. 11/21 xray kub: Persistent ileus and  pneumoperitoneum-postop 11/23 started soft diet diet by general surgery.  NG tube and TPN Dc'd.  DC plan tomorrow a.m.  # Hyponatremia: Resolved Seems like acute on chronic hyponatremia.  Na 141 -Continue with normal saline -Monitor sodium  # Hypokalemia, potassium repleted.  Resolved # Hypophosphatemia, Phos repleted.  Resolved Monitor electrolytes and replete as needed.  # Hypoglycemia on 11/20 Follow hypoglycemia protocol S/p TPN by general surgery, DC'd today  Hypertension Blood pressure currently within goal. -Holding home lisinopril as patient is n.p.o. -As needed IV hydralazine   Hyperlipidemia - Holding home statin as she is n.p.o.  GERD (gastroesophageal reflux disease) - On IV Protonix   # Iron deficiency, Tsat 9% Start oral iron supplement with vitamin C on discharge Follow-up with PCP to repeat iron profile after 3 to 6 months  11/20 s/p PICC line inserted for TPN  Subjective: No significant overnight events.  Patient denied any abdominal pain, tolerated diet well, moved bowels.  Diet was advanced, discontinued NG tube.  TPN will be discontinued today evening. Most likely patient will be discharged tomorrow a.m. as per general surgery.     Physical Exam: Vitals:   10/05/24 1445 10/05/24 2045 10/06/24 0539 10/06/24 1130  BP: 130/77 99/65 118/69 97/63  Pulse: 81 81 69 87  Resp: 16 16 16 18   Temp: 97.8 F (36.6 C) (!) 97.4 F (36.3 C) 97.9 F (36.6 C) 97.8 F (36.6 C)  TempSrc:  Oral Oral   SpO2: 100% 99% 98% 99%  Weight:   49 kg   Height:       General.  Malnourished elderly lady, in no acute distress.  NG tube in place. Pulmonary.  Lungs clear bilaterally, normal respiratory effort. CV.  Regular rate and rhythm, No murmur. Abdomen.  BS positive, Mildly distended, No tenderness CNS.  Alert and oriented .  No focal neurologic deficit. Extremities.  No edema Psychiatry.  Judgment and insight appears normal.   Data Reviewed:  Prior data  reviewed  Family Communication: Discussed with patient and a family friend at bedside  Disposition: Status is: Inpatient Remains inpatient appropriate because: Severity of illness  Planned Discharge Destination: To be determined  DVT prophylaxis.  Lovenox  Time spent: 35 minutes  This record has been created using Conservation officer, historic buildings. Errors have been sought and corrected,but may not always be located. Such creation errors do not reflect on the standard of care.   Author: Elvan Sor, MD 10/06/2024 1:50 PM  For on call review [email or web address].

## 2024-10-07 LAB — BASIC METABOLIC PANEL WITH GFR
Anion gap: 9 (ref 5–15)
BUN: 17 mg/dL (ref 8–23)
CO2: 25 mmol/L (ref 22–32)
Calcium: 8.6 mg/dL — ABNORMAL LOW (ref 8.9–10.3)
Chloride: 101 mmol/L (ref 98–111)
Creatinine, Ser: 0.64 mg/dL (ref 0.44–1.00)
GFR, Estimated: >60 mL/min (ref >60)
Glucose, Bld: 105 mg/dL — ABNORMAL HIGH (ref 70–99)
Potassium: 4.5 mmol/L (ref 3.5–5.1)
Sodium: 135 mmol/L (ref 135–145)

## 2024-10-07 LAB — CBC
HCT: 37.3 % (ref 36.0–46.0)
Hemoglobin: 12.7 g/dL (ref 12.0–15.0)
MCH: 31.1 pg (ref 26.0–34.0)
MCHC: 34 g/dL (ref 30.0–36.0)
MCV: 91.2 fL (ref 80.0–100.0)
Platelets: 320 K/uL (ref 150–400)
RBC: 4.09 MIL/uL (ref 3.87–5.11)
RDW: 14.7 % (ref 11.5–15.5)
WBC: 8.2 K/uL (ref 4.0–10.5)
nRBC: 0 % (ref 0.0–0.2)

## 2024-10-07 LAB — GLUCOSE, CAPILLARY: Glucose-Capillary: 114 mg/dL — ABNORMAL HIGH (ref 70–99)

## 2024-10-07 LAB — MAGNESIUM: Magnesium: 1.9 mg/dL (ref 1.7–2.4)

## 2024-10-07 LAB — PHOSPHORUS: Phosphorus: 4.1 mg/dL (ref 2.5–4.6)

## 2024-10-07 NOTE — Progress Notes (Signed)
 [facility]- General Surgery  SURGICAL PROGRESS NOTE  Hospital Day(s): 8.   Post op day(s): 8 Days Post-Op.   Interval History:  Patient seen and examined. No acute events or new complaints overnight.  Doing well this morning, sitting on recliner comfortably.  Reports having scrambled eggs, pancakes, and spaghetti yesterday.  Has tolerated well.  Denies nausea or vomiting. Denies any abdominal discomfort.  Vital signs in last 24 hours: [min-max] current  Temp:  [97.6 F (36.4 C)-98.2 F (36.8 C)] 98.2 F (36.8 C) (11/24 0730) Pulse Rate:  [80-87] 80 (11/24 0730) Resp:  [15-18] 15 (11/24 0730) BP: (97-118)/(60-76) 118/76 (11/24 0730) SpO2:  [99 %-100 %] 99 % (11/24 0730)     Height: 5' 4 (162.6 cm) Weight: 49 kg BMI (Calculated): 18.53   Intake/Output last 2 shifts:  11/23 0701 - 11/24 0700 In: 1570.9 [P.O.:960; I.V.:610.9] Out: -    Physical Exam:  Constitutional: alert, cooperative and no distress  Respiratory: breathing non-labored at rest  Cardiovascular: regular rate and sinus rhythm  Gastrointestinal: soft, non-tender, and mildly distended, Prevena VAC was removed, midline incision is clean and dry, no drainage or signs of infection.  Skin staples are intact.  Labs:     Latest Ref Rng & Units 10/07/2024    4:31 AM 10/06/2024    3:47 AM 10/05/2024    3:49 AM  CBC  WBC 4.0 - 10.5 K/uL 8.2  6.1  5.8   Hemoglobin 12.0 - 15.0 g/dL 87.2  87.8  88.0   Hematocrit 36.0 - 46.0 % 37.3  34.4  34.1   Platelets 150 - 400 K/uL 320  278  298       Latest Ref Rng & Units 10/07/2024    4:31 AM 10/06/2024    3:47 AM 10/05/2024    3:49 AM  CMP  Glucose 70 - 99 mg/dL 894  869  853   BUN 8 - 23 mg/dL 17  16  11    Creatinine 0.44 - 1.00 mg/dL 9.35  9.49  9.56   Sodium 135 - 145 mmol/L 135  135  135   Potassium 3.5 - 5.1 mmol/L 4.5  4.4  3.6   Chloride 98 - 111 mmol/L 101  103  100   CO2 22 - 32 mmol/L 25  25  28    Calcium  8.9 - 10.3 mg/dL 8.6  8.4  7.7     Imaging  studies: No new pertinent imaging studies   Assessment/Plan:  77 y.o. female with cecal volvulus 8 Days Post-Op s/p s/p right hemicolectomy with primary anastomosis, complicated by pertinent comorbidities including essential hypertension, dyslipidemia, and GERD.    - Resolved postop ileus. Was advance to soft diet yesterday and has tolerated it well. Ambulating well. Denies any abdominal discomfort. Improving abdominal distention.  - Patient is clear from surgical standpoint.  No contraindication for discharge from our standpoint. May resume to her regular diet. Will follow-up with Dr. Rodolph outpatient in two weeks.   -- Gilmer Cea PA-C

## 2024-10-07 NOTE — Discharge Instructions (Signed)
  Diet: Resume home heart healthy regular diet.   Activity: No heavy lifting >20 pounds (children, pets, laundry, garbage) or strenuous activity until follow-up, but light activity and walking are encouraged. Do not drive or drink alcohol if taking narcotic pain medications.  Wound care: May shower with soapy water and pat dry (do not rub incision), but no baths or submerging incision underwater until follow-up. (no swimming)   Medications: Resume all home medications. For mild to moderate pain: acetaminophen  (Tylenol ) or ibuprofen (if no kidney disease). Combining Tylenol  with alcohol can substantially increase your risk of causing liver disease. Narcotic pain medications, if prescribed, can be used for severe pain, though may cause nausea, constipation, and drowsiness. Do not combine Tylenol  and Norco within a 6 hour period as Norco contains Tylenol . If you do not need the narcotic pain medication, you do not need to fill the prescription.  Call office [phone]) at any time if any questions, worsening pain, fevers/chills, bleeding, drainage from incision site, or other concerns.

## 2024-10-07 NOTE — Discharge Summary (Signed)
 [facility] Discharge Summary   Patient: Anna Castaneda FMW:[id number]  PCP: Sadie Manna, MD  Date of admission: 09/29/2024   Date of discharge:  10/07/2024     Discharge Diagnoses:  Principal Problem:   SBO (small bowel obstruction) (HCC) Active Problems:   Cecal volvulus (HCC)   Hyponatremia   Hypertension   Hyperlipidemia   GERD (gastroesophageal reflux disease)   Protein-calorie malnutrition, severe   Admitted From: Home Disposition:  Home   Recommendations for Outpatient Follow-up:  F/u with PCP in 1 wk F/u with Gen surgery in 2 wks Follow up LABS/TEST:     Follow-up Information     Rodolph Romano, MD Follow up in 2 week(s).   Specialty: General Surgery Why: 2 weeks post-op cecal volvulus (remove skin staples) Contact information: [address] [place] [address] [phone]                Diet recommendation: low fiber soft diet  Activity: The patient is advised to gradually reintroduce usual activities, as tolerated  Discharge Condition: stable  Code Status: Full code   History of present illness: As per the H and P dictated on admission.  Hospital Course:  Partly taken from prior notes.   Anna Castaneda is a 78 y.o. female with medical history significant of HTN, HLD, CVA, chronic hyponatremia, anxiety/depression, MI, coming in with worsening of abdominal pain and feeling nausea and bloating.  No history of significant abdominal surgeries.   On presentation hemodynamically stable, labs pertinent for hyponatremia with sodium at 121, CT abdomen and pelvis with concern of SBO, markedly distended stomach and suspicious for atypical cecal volvulus.   General surgery was consulted, continue to get worse abdominal distention and pain despite having NG tube so she was taken to the OR s/p right hemicolectomy with primary anastomosis.  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    Assessment and Plan:   # SBO (small  bowel obstruction): Resolved # Cecal volvulus with gangrene: Resolved General surgery was on board. S/p right hemicolectomy with primary anastomosis. NG tube with intermittent suction was continued for few days as patient developed postop ileus which resolved after few days. -11/18 s/p Gastrografin  studies, possible ileus versus SBO. -s/p IV fluid -PT and OT evaluation 11/19 NG tube clamped, x-ray ordered and started on clear liquid diet as per general surgery. 11/20 postop ileus, started TPN, n.p.o. and continued NG tube with intermittent suction as per general surgery. 11/21 xray kub: Persistent ileus and pneumoperitoneum-postop 11/23 started soft diet diet by general surgery.  NG tube and TPN Dc'd. 11/24 patient tolerated diet well.  Seen by general surgery and cleared for discharge to follow-up as an outpatient in 2 weeks.   # Hyponatremia: Resolved Seems like acute on chronic hyponatremia. Na 141 # Hypokalemia, potassium repleted.  Resolved # Hypophosphatemia, Phos repleted.  Resolved # Hypoglycemia on 11/20, s/p Hypoglycemia protocol S/p TPN by general surgery, DC'd today # Hypertension: BP remained stable, resumed home dose lisinopril on discharge.   # Hyperlipidemia: Resumed Lipitor 20 mg p.o. daily home dose on discharge. # GERD (gastroesophageal reflux disease): On IV Protonix  # Iron deficiency, Tsat 9%: Resumed oral iron supplement with vitamin C. on discharge. Follow-up with PCP to repeat iron profile after 3 to 6 months   11/20 s/p PICC line inserted for TPN, which was discontinued before discharge on 11/24.   Body mass index is 18.54 kg/m.  Nutrition Problem: Severe Malnutrition Etiology: social / environmental circumstances Nutrition Interventions:  Patient  was ambulatory without any assistance.  On the day of the discharge the patient's vitals were stable, and no other acute medical condition were reported by patient. the patient was felt safe to be discharge at  Home.  Consultants: General surgery Procedures: s/p ex lap due to cecal volvulus causing SBO.  Discharge Exam: General: Appear in no distress, Oral Mucosa Clear, moist. Cardiovascular: S1 and S2 Present, no Murmur, Respiratory: normal respiratory effort, Bilateral Air entry present and no Crackles, no wheezes Abdomen: Bowel Sound present, Soft and no tenderness. Prevena VAC was removed, midline incision is clean and dry, no drainage or signs of infection. Skin staples are intact.  Extremities: no Pedal edema, no calf tenderness Neurology: alert and oriented to time, place, and person affect appropriate.   Filed Weights   10/04/24 0337 10/05/24 0536 10/06/24 0539  Weight: 50 kg 50 kg 49 kg   Vitals:   10/06/24 2021 10/07/24 0730  BP: 116/60 118/76  Pulse: 83 80  Resp: 16 15  Temp: 97.6 F (36.4 C) 98.2 F (36.8 C)  SpO2: 100% 99%    DISCHARGE MEDICATION: Allergies as of 10/07/2024   No Active Allergies      Medication List     STOP taking these medications    potassium chloride  20 MEQ packet Commonly known as: KLOR-CON        TAKE these medications    ALPHA-D-GALACTOSIDASE PO Take 300 Units by mouth.   aluminum hydroxide-magnesium carbonate 95-358 mg/15 mL Susp Commonly known as: GAVISCON Take by mouth every evening. nightly   aspirin  EC 81 MG tablet Take 81 mg by mouth daily.   atorvastatin  20 MG tablet Commonly known as: LIPITOR Take 20 mg by mouth daily.   Biotin  5 MG Tabs Take 1 tablet (5 mg total) by mouth daily.   bismuth subsalicylate 262 MG/15ML suspension Commonly known as: PEPTO BISMOL Take by mouth.   CALCIUM  LACTATE PO Take by mouth.   cetirizine 10 MG tablet Commonly known as: ZYRTEC Take 10 mg by mouth daily as needed for allergies.   clobetasol cream 0.05 % Commonly known as: TEMOVATE Apply 1 Application topically 2 (two) times daily.   co-enzyme Q-10 50 MG capsule Take 200 mg by mouth daily. Ubidecarenone (Co-Enzyme Q-10,  Ubiquinone) 200 tablet   cyanocobalamin 500 MCG tablet Commonly known as: VITAMIN B12 Take 1,000 mcg by mouth daily.   denosumab 60 MG/ML Sosy injection Commonly known as: PROLIA Inject 60 mg into the skin every 6 (six) months.   FERROUS FUM-IRON POLYSACCH PO Take by mouth. Iron fum, poly #1-vitC-L.casei 130 mg iron-25 mg-30 mg Cap   ketoconazole 2 % cream Commonly known as: NIZORAL Apply 1 Application topically 2 (two) times daily.   lisinopril 10 MG tablet Commonly known as: ZESTRIL Take 10 mg by mouth daily.   MULTIVITAMIN PO Take by mouth. Multivitamin with B complex-vitamin C (FARBEE WITH C) tablet   multivitamin-lutein Caps capsule Take 1 capsule by mouth 2 (two) times daily.   niacinamide 500 MG tablet Take 500 mg by mouth daily.   NON FORMULARY Take by mouth gelatin sponge, absorb/porcine (Gelatine Absorbable MISC)   NON FORMULARY Take by mouth C, E, zinc, copper 11/omega3s/lut (OCUVITE ADULT50+ ORAL)   NON FORMULARY Benefiber, Guar gum, Oral   NON FORMULARY Calm with Calcium    NON FORMULARY CocoaVia   pantoprazole  20 MG tablet Commonly known as: PROTONIX  Take 20 mg by mouth daily.   pimecrolimus 1 % cream Commonly known as: ELIDEL Apply 1 Application  topically 2 (two) times daily.   protein supplement Powd Take 2 Scoops by mouth 2 (two) times daily.   saline Gel Place 1 Application into both nostrils every 4 (four) hours as needed.   SIMETHICONE PO Take 1 tablet by mouth every 6 (six) hours as needed (flutulence). GAS-X ORAL)   vitamin C 1000 MG tablet Take 1,000 mg by mouth daily.   Vitamin D3 50 MCG (2000 UT) capsule Take 2,000 Units by mouth daily.               Discharge Care Instructions  (From admission, onward)           Start     Ordered   10/07/24 0000  Discharge wound care:       Comments: As per surgery team   10/07/24 1045           No Active Allergies Discharge Instructions     Call MD for:   difficulty breathing, headache or visual disturbances   Complete by: As directed    Call MD for:  extreme fatigue   Complete by: As directed    Call MD for:  persistant dizziness or light-headedness   Complete by: As directed    Call MD for:  persistant nausea and vomiting   Complete by: As directed    Call MD for:  severe uncontrolled pain   Complete by: As directed    Call MD for:  temperature >100.4   Complete by: As directed    Diet general   Complete by: As directed    Discharge instructions   Complete by: As directed    F/u with PCP in 1 wk F/u with Gen surgery in 2 wks   Discharge wound care:   Complete by: As directed    As per surgery team   Increase activity slowly   Complete by: As directed        The results of significant diagnostics from this hospitalization (including imaging, microbiology, ancillary and laboratory) are listed below for reference.    Significant Diagnostic Studies: CT ABDOMEN PELVIS W CONTRAST Result Date: 10/04/2024 EXAM: CT ABDOMEN AND PELVIS WITH CONTRAST 10/04/2024 02:41:11 PM TECHNIQUE: CT of the abdomen and pelvis was performed with the administration of 80 mL of iohexol  (OMNIPAQUE ) 300 MG/ML solution. Multiplanar reformatted images are provided for review. Automated exposure control, iterative reconstruction, and/or weight-based adjustment of the mA/kV was utilized to reduce the radiation dose to as low as reasonably achievable. COMPARISON: CT from 09/29/2024, plain film from earlier in the same day. CLINICAL HISTORY: Abdominal pain, post-op. FINDINGS: LOWER CHEST: Lung bases are well aerated with some mild emphysematous changes. LIVER: The liver is unremarkable. GALLBLADDER AND BILE DUCTS: Gallbladder is unremarkable. No biliary ductal dilatation. SPLEEN: No acute abnormality. PANCREAS: No acute abnormality. ADRENAL GLANDS: No acute abnormality. KIDNEYS, URETERS AND BLADDER: Kidneys demonstrate a normal enhancement pattern. Some mild scarring in  the left kidney is noted with decreased volume similar to that seen on the prior exam. Stable cysts are noted in the left kidney. No further follow-up is recommended. No stones in the kidneys or ureters. The bladder is well distended. GI AND BOWEL: The stomach is partially decompressed by the gastric catheter. Changes of recent cecectomy are noted. Contrast material is noted throughout the colon. Contrast material passes from the small bowel to the colon without difficulty. Mildly dilated loops of small bowel are noted, which may represent a small bowel ileus, postoperative in nature. PERITONEUM AND RETROPERITONEUM: Free  air is noted in the abdomen, although given the recent laparotomy this is felt to be within normal limits. No significant free fluid is noted. VASCULATURE: Aorta demonstrates atherosclerotic calcification without aneurysmal dilatation. LYMPH NODES: No lymphadenopathy. REPRODUCTIVE ORGANS: The uterus and adnexa are within normal limits. BONES AND SOFT TISSUES: No acute osseous abnormality. IMPRESSION: 1. Mildly dilated loops of small bowel, possibly representing a postoperative ileus. No true obstructive changes Electronically signed by: Oneil Devonshire MD 10/04/2024 03:03 PM EST RP Workstation: [id number] Views Result Date: 10/04/2024 EXAM: 2 VIEW XRAY OF THE ABDOMEN 10/04/2024 08:28:00 AM COMPARISON: 10/02/2024 CLINICAL HISTORY: Ileus following gastrointestinal surgery (HCC) FINDINGS: LINES, TUBES AND DEVICES: Enteric tube stable in gastric body. Midline skin staples noted. BOWEL: Persistent gas-filled dilated loops of bowel consistent with possible ileus. SOFT TISSUES: No opaque urinary calculi. BONES: No acute osseous abnormality. DIAPHRAGMS: Free air under right hemidiaphragm. HEART AND MEDIASTINUM: Stable cardiomegaly. IMPRESSION: 1. Persistent gas-filled dilated loops of bowel consistent with ileus. 2. Persistent signs of pneumoperitoneum in the early postoperative time frame  Electronically signed by: Waddell Calk MD 10/04/2024 12:56 PM EST RP Workstation: [id number]   US  EKG SITE RITE Result Date: 10/03/2024 If Site Rite image not attached, placement could not be confirmed due to current cardiac rhythm.  DG Abd 2 Views Result Date: 10/02/2024 EXAM: 2 VIEW XRAY OF THE ABDOMEN 10/02/2024 10:42:02 AM COMPARISON: 10/01/2024 CLINICAL HISTORY: Ileus following gastrointestinal surgery (HCC) FINDINGS: LINES, TUBES AND DEVICES: Enteric tube in place with tip in left mid abdomen. BOWEL: Persistent gaseous dilated loops of bowel loops compatible with ileus. SOFT TISSUES: Skin staples noted. Free air under right hemidiaphragm, likely postsurgical. No opaque urinary calculi. BONES: No acute osseous abnormality. IMPRESSION: 1. Persistent gaseous dilated bowel loops compatible with ileus. 2. Free air under the right hemidiaphragm, likely postoperative. Electronically signed by: Waddell Calk MD 10/02/2024 01:50 PM EST RP Workstation: [id number]   DG Abd Portable 1V-Small Bowel Obstruction Protocol-initial, 8 hr delay Result Date: 10/01/2024 CLINICAL DATA:  Small-bowel obstruction EXAM: PORTABLE ABDOMEN - 1 VIEW COMPARISON:  Abdominal x-ray 09/29/2024, CT abdomen and pelvis 09/29/2024. FINDINGS: Enteric tube tip is in the region of the presumed gastric body. Gaseous distention of the stomach has decreased. There are new midline surgical clips present. There are multiple dilated loops of small bowel measuring up to 4 cm containing contrast within the lower abdomen. No significant colonic gas identified. Free air is difficult to exclude on a supine view. IMPRESSION: 1. Multiple dilated loops of small bowel measuring up to 4 cm containing contrast within the lower abdomen. No significant colonic gas identified. Findings are concerning for small bowel obstruction. Postoperative ileus is also in the differential. 2. Enteric tube tip is in the region of the presumed gastric body. Electronically  Signed   By: Greig Pique M.D.   On: 10/01/2024 23:30   DG Abd Portable 1 View Result Date: 09/29/2024 EXAM: 1 VIEW XRAY OF THE ABDOMEN 09/29/2024 10:37:00 AM COMPARISON: Study performed earlier today. CLINICAL HISTORY: Verify placement of NG tube. FINDINGS: LIMITATIONS/ARTIFACTS: The patient appears rotated. LINES, TUBES AND DEVICES: Enteric tube is identified with tip just below the level of the hemidiaphragms and side port above the level of the GE junction. BOWEL: Persistent gaseous distention of the gastric lumen. Dilated loops of bowel within the imaged portions of the abdomen is again noted and appears unchanged from study performed earlier today. SOFT TISSUES: No opaque urinary calculi. BONES: No acute osseous abnormality. IMPRESSION: 1. Enteric  tube with tip just below the hemidiaphragms and side port above the GE junction, recommend advancement so the side port lies within the stomach or distal to the GE junction. 2. Persistent gaseous distention of the stomach. 3. Dilated bowel loops within the imaged abdomen, unchanged from earlier today. Electronically signed by: Waddell Calk MD 09/29/2024 11:09 AM EST RP Workstation: [id number]   DG Abd Portable 1 View Result Date: 09/29/2024 CLINICAL DATA:  NG tube placement. EXAM: PORTABLE ABDOMEN - 1 VIEW COMPARISON:  11/03/2023, CT today. FINDINGS: Elevation of the left hemidiaphragm with moderate gastric distention unchanged. Nasogastric tube demonstrates a curvilinear course over the mediastinum right of midline with tip over the level of the medial right lung base. The NG tube tip may be within patient's known hiatal hernia, although endobronchial placement is possible. Mild cardiomegaly. Lung bases are clear. Multiple air-filled bowel loops incompletely evaluated but without significant change compared to recent CT. IMPRESSION: 1. Nasogastric tube with a curvilinear course over the mediastinum right of midline with tip over the medial right lung base.  The NG tube tip may be within patient's known hiatal hernia, although endobronchial placement is possible. Recommend repositioning. 2. Stable elevation of the left hemidiaphragm with moderate gastric distention. These results were called by telephone at the time of interpretation on 09/29/2024 at 9:46 am to provider [facility] , who verbally acknowledged these results. Electronically Signed   By: Toribio Agreste M.D.   On: 09/29/2024 09:49   CT ABDOMEN PELVIS W CONTRAST Result Date: 09/29/2024 EXAM: CT ABDOMEN AND PELVIS WITH CONTRAST 09/29/2024 07:57:24 AM TECHNIQUE: CT of the abdomen and pelvis was performed with the administration of 100 mL of iohexol  (OMNIPAQUE ) 300 MG/ML solution. Multiplanar reformatted images are provided for review. Automated exposure control, iterative reconstruction, and/or weight-based adjustment of the mA/kV was utilized to reduce the radiation dose to as low as reasonably achievable. COMPARISON: None available. CLINICAL HISTORY: Bowel obstruction suspected. FINDINGS: LOWER CHEST: No acute abnormality. LIVER: The liver is unremarkable. GALLBLADDER AND BILE DUCTS: The gallbladder is either contracted or absent. No biliary ductal dilatation. SPLEEN: The spleen is diminutive. PANCREAS: No acute abnormality. ADRENAL GLANDS: No acute abnormality. KIDNEYS, URETERS AND BLADDER: There are small simple cysts arising from the superior pole of the left kidney, which is atrophic. Per consensus, no follow-up is needed for simple Bosniak type 1 and 2 renal cysts, unless the patient has a malignancy history or risk factors. No stones in the kidneys or ureters. No hydronephrosis. No perinephric or periureteral stranding. Urinary bladder is unremarkable. GI AND BOWEL: The stomach is markedly distended with fluid and air, measuring up to 13 cm in diameter. The cecum is also abnormally distended, measuring up to 9 cm in diameter. There is formed fecal material present in what appears to be the distal  small bowel, suggestive of obstruction. PERITONEUM AND RETROPERITONEUM: No ascites. No free air. VASCULATURE: The abdominal aorta is normal in caliber and demonstrates mild-to-moderate calcific atheromatous disease. LYMPH NODES: No lymphadenopathy. REPRODUCTIVE ORGANS: No acute abnormality. BONES AND SOFT TISSUES: There is mild levoscoliosis of the thoracolumbar spine. No acute osseous abnormality. No focal soft tissue abnormality. IMPRESSION: 1. Findings concerning for bowel obstruction with markedly distended stomach (up to 13 cm) and cecum (up to 9 cm) and fecalized distal small bowel contents; recommend urgent clinical and surgical evaluation. This is concerning for an atypical appearance of cecal volvulus. Surgery consultation is recommended. 2. Possible gastric outlet obstruction. 3. Small simple-appearing renal cysts in an atrophic left kidney;  benign Bosniak I/II cysts with no follow-up imaging recommended Electronically signed by: Evalene Coho MD 09/29/2024 08:16 AM EST RP Workstation: [id number]    Microbiology: No results found for this or any previous visit (from the past 240 hours).   Labs: CBC: Recent Labs  Lab 10/03/24 0544 10/04/24 0330 10/05/24 0349 10/06/24 0347 10/07/24 0431  WBC 5.6 7.2 5.8 6.1 8.2  HGB 11.6* 12.1 11.9* 12.1 12.7  HCT 34.5* 34.8* 34.1* 34.4* 37.3  MCV 91.5 91.1 89.7 89.8 91.2  PLT 342 312 298 278 320   Basic Metabolic Panel: Recent Labs  Lab 10/03/24 0544 10/04/24 0330 10/05/24 0349 10/06/24 0347 10/07/24 0431  NA 141 139 135 135 135  K 3.3* 3.5 3.6 4.4 4.5  CL 106 103 100 103 101  CO2 23 28 28 25 25   GLUCOSE 67* 164* 146* 130* 105*  BUN 11 9 11 16 17   CREATININE 0.61 0.53 0.43* 0.50 0.64  CALCIUM  7.5* 7.5* 7.7* 8.4* 8.6*  MG 2.4 2.2 2.1 2.0 1.9  PHOS 1.5* 1.6* 1.9* 3.2 4.1   Liver Function Tests: Recent Labs  Lab 10/01/24 0404  ALBUMIN 3.1*   No results for input(s): LIPASE, AMYLASE in the last 168 hours. No results for  input(s): AMMONIA in the last 168 hours. Cardiac Enzymes: No results for input(s): CKTOTAL, CKMB, CKMBINDEX, TROPONINI in the last 168 hours. BNP (last 3 results) No results for input(s): BNP in the last 8760 hours. CBG: Recent Labs  Lab 10/06/24 0015 10/06/24 0540 10/06/24 1131 10/06/24 1628 10/07/24 0941  GLUCAP 130* 144* 110* 118* 114*    Time spent: 35 minutes  Signed:  Elvan Sor  [facility] 10/07/2024 10:47 AM

## 2024-10-07 NOTE — Progress Notes (Signed)
 Mobility Specialist Progress Note:    10/07/24 0810  Mobility  Activity Ambulated with assistance  Level of Assistance Modified independent, requires aide device or extra time  Assistive Device None  Distance Ambulated (ft) 1000 ft  Range of Motion/Exercises Active;All extremities  Activity Response Tolerated well  Mobility visit 1 Mobility  Mobility Specialist Start Time (ACUTE ONLY) 0750  Mobility Specialist Stop Time (ACUTE ONLY) 0804  Mobility Specialist Time Calculation (min) (ACUTE ONLY) 14 min     Anna Castaneda Mobility Specialist Please contact via Special Educational Needs Teacher or  Rehab office at [phone]

## 2024-10-07 NOTE — TOC Transition Note (Signed)
 Transition of Care [facility]) - Discharge Note   Patient Details  Name: Anna Castaneda MRN: [id number] Date of Birth: 07-08-1946  [facility] CM/SW Contact:  Corean ONEIDA Haddock, RN Phone Number: 10/07/2024, 11:00 AM   Clinical Narrative:     Patient to return to [facility] independent living No IP Care Management needs identified. Patient has progressed with therapy and is ambulating 1000 feet or more daily     Barriers to Discharge: Continued Medical Work up   Patient Goals and CMS Choice            Discharge Placement                       Discharge Plan and Services Additional resources added to the After Visit Summary for     Discharge Planning Services: CM Consult                                 Social Drivers of Health (SDOH) Interventions SDOH Screenings   Food Insecurity: No Food Insecurity (09/29/2024)  Housing: Low Risk  (09/29/2024)  Transportation Needs: No Transportation Needs (09/29/2024)  Utilities: Not At Risk (09/29/2024)  Alcohol Screen: Low Risk  (08/22/2023)  Financial Resource Strain: Low Risk  (06/07/2024)   Received from [facility]  Physical Activity: Sufficiently Active (08/22/2023)  Social Connections: Moderately Integrated (09/29/2024)  Stress: No Stress Concern Present (08/22/2023)  Tobacco Use: Medium Risk (09/29/2024)     Readmission Risk Interventions     No data to display

## 2024-10-22 NOTE — Progress Notes (Signed)
 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  She is about three weeks post right hemicolectomy with primary anastomosis on September 29, 2024, for a cecal volvulus. She was discharged on October 07, 2024.  She has no abdominal pain. She eats and drinks without difficulty. Admits she's eating about 5 small meals a day.  Bowel movements are regular.  Has noticed some mild oozing from her midline incision. Denies any drainage for the past few days. She cleans the area with soap and water. She has no nausea or vomiting.   Current Medications: has a current medication list which includes the following prescription(s): ascorbic acid (vitamin c), atorvastatin , atorvastatin , biotin , bismuth subsalicylate, calcium  lactate, cetirizine, cholecalciferol, clobetasol, cyanocobalamin, denosumab, desonide, ferrous sulfate, glucosamine/condroitin/herb182, glutamine (bulk), hydrocortisone, ketoconazole, krill-om-3-dha-epa-phospho-ast, lactulose , lisinopril, mag carb/aluminum hydrox/algin, niacinamide, NON FORMULARY, NON FORMULARY, NON FORMULARY, NON FORMULARY, NON FORMULARY, NON FORMULARY, NON FORMULARY, NON FORMULARY, NON FORMULARY, pantoprazole , pantoprazole , pimecrolimus, tretinoin, and ocuvite lutein and zeaxanthin.  Allergies:  No Known Allergies  ROS: Pertinent positives and negatives noted in HPI    Objective:     Ht 162.6 cm (5' 4)   Wt 52.6 kg (116 lb)   BMI 19.91 kg/m   Constitutional :  Alert, no distress, cooperative  Gastrointestinal: soft, non-tender; removed skin staples. Midline incision has healed as expected. No drainage or other signs of infection. Applied a few steri-strips.   Musculoskeletal: Steady gait and movement   LABS:  N/A   RADS: N/A  Surgical pathology:  SURGICAL PATHOLOGY  [facility]  [address], [address]  [place], [place] [address]  Telephone [phone] or [phone] Fax [phone]   REPORT OF SURGICAL PATHOLOGY    Accession #: [id number]  Patient Name: ADRIAUNA, CAMPTON  Visit # : [id number]   MRN: [id number]  Physician: Rodolph Romano  DOB/Age Feb 07, 1946 (Age: 52) Gender: F  Collected Date: 09/29/2024  Received Date: 09/30/2024   FINAL DIAGNOSIS        1. 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       APPENDIX WITH LUMINAL FIBROSIS.       ONE BENIGN LYMPH NODE (0/1).    DATE SIGNED OUT: 10/01/2024  ELECTRONIC SIGNATURE : Belvie Come, John, Pathologist, Electronic Signature   MICROSCOPIC DESCRIPTION   CASE COMMENTS  STAINS USED IN DIAGNOSIS:  H&E  H&E  H&E  H&E  H&E  H&E  H&E     CLINICAL HISTORY   SPECIMEN(S) OBTAINED  1. Colon, segmental resection, Right   SPECIMEN COMMENTS:  SPECIMEN CLINICAL INFORMATION:  1. Cecal volvulus   Gross Description  1. Specimen: terminal ileum, cecum, appendix, ascending colon       Length:       Ileum - 10 cm long, circumference 3.0 to 7.0 cm       Colon - 32 cm long, cicrcumference 7.1 to 24 cm       Serosa: dusky, red-brown at the dilated areas. Mesentery is diffusely edematou,       no thrombi in vasculature.       Contents: liquid fecal material       Mucosa/Wall: ileum and colon mucosa range from tan with normal folds at marginal       aspects to red-brown and  granular with fold attenuation at dilated areas.       Lymph nodes: multiple lymph nodes up to 0.7 cm.       Appendix: 3.9 cm long, 0.3 cm diameter, disrupted. mucosa and lumen are       unremarkable distally, hemorrhagic proximally.       Block Summary:       1A - ileum margin       1B - colon margin       1C - dilated colon       1D - dilated ileum       1E - 1 lymph node, sectioned       50F - mesentery       1G - appendix 1/2 tip  and cross sections       mb 09-30-24    Report signed out from the following location(s)  [place] [place]  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 Normal bowel function. Incision healing well. Minor skin separation at midline incision. No recent oozing. Healing well. - No dietary restrictions  - Applied Steri strips to incision site. - Allow showering with soap and water, avoid scrubbing incision  - Limit to 10 lb weights until follow-up - Follow-up in 4 weeks

## 2024-10-23 ENCOUNTER — Inpatient Hospital Stay
Admission: EM | Admit: 2024-10-23 | Discharge: 2024-10-26 | DRG: 389 | Disposition: A | Discharge status: Skilled Nursing Facility | Source: Ambulatory Visit | Attending: Internal Medicine | Admitting: Internal Medicine

## 2024-10-23 ENCOUNTER — Inpatient Hospital Stay

## 2024-10-23 ENCOUNTER — Emergency Department

## 2024-10-23 ENCOUNTER — Other Ambulatory Visit: Payer: Self-pay

## 2024-10-23 DIAGNOSIS — Z8673 Personal history of transient ischemic attack (TIA), and cerebral infarction without residual deficits: Secondary | ICD-10-CM | POA: Diagnosis not present

## 2024-10-23 DIAGNOSIS — Z9842 Cataract extraction status, left eye: Secondary | ICD-10-CM | POA: Diagnosis not present

## 2024-10-23 DIAGNOSIS — E871 Hypo-osmolality and hyponatremia: Secondary | ICD-10-CM | POA: Diagnosis present

## 2024-10-23 DIAGNOSIS — Z9049 Acquired absence of other specified parts of digestive tract: Secondary | ICD-10-CM | POA: Diagnosis not present

## 2024-10-23 DIAGNOSIS — E860 Dehydration: Secondary | ICD-10-CM | POA: Diagnosis present

## 2024-10-23 DIAGNOSIS — R569 Unspecified convulsions: Secondary | ICD-10-CM

## 2024-10-23 DIAGNOSIS — I10 Essential (primary) hypertension: Secondary | ICD-10-CM | POA: Diagnosis present

## 2024-10-23 DIAGNOSIS — I252 Old myocardial infarction: Secondary | ICD-10-CM | POA: Diagnosis not present

## 2024-10-23 DIAGNOSIS — K219 Gastro-esophageal reflux disease without esophagitis: Secondary | ICD-10-CM | POA: Diagnosis present

## 2024-10-23 DIAGNOSIS — K56609 Unspecified intestinal obstruction, unspecified as to partial versus complete obstruction: Secondary | ICD-10-CM | POA: Diagnosis present

## 2024-10-23 DIAGNOSIS — Z7982 Long term (current) use of aspirin: Secondary | ICD-10-CM | POA: Diagnosis not present

## 2024-10-23 DIAGNOSIS — K5669 Other partial intestinal obstruction: Secondary | ICD-10-CM | POA: Diagnosis present

## 2024-10-23 DIAGNOSIS — Z87891 Personal history of nicotine dependence: Secondary | ICD-10-CM | POA: Diagnosis not present

## 2024-10-23 DIAGNOSIS — E785 Hyperlipidemia, unspecified: Secondary | ICD-10-CM | POA: Diagnosis present

## 2024-10-23 DIAGNOSIS — Z79899 Other long term (current) drug therapy: Secondary | ICD-10-CM | POA: Diagnosis not present

## 2024-10-23 DIAGNOSIS — Z9841 Cataract extraction status, right eye: Secondary | ICD-10-CM | POA: Diagnosis not present

## 2024-10-23 DIAGNOSIS — R748 Abnormal levels of other serum enzymes: Secondary | ICD-10-CM | POA: Diagnosis present

## 2024-10-23 LAB — COMPREHENSIVE METABOLIC PANEL WITH GFR
ALT: 14 U/L (ref 0–44)
AST: 22 U/L (ref 15–41)
Albumin: 4.3 g/dL (ref 3.5–5.0)
Alkaline Phosphatase: 51 U/L (ref 38–126)
Anion gap: 12 (ref 5–15)
BUN: 11 mg/dL (ref 8–23)
CO2: 26 mmol/L (ref 22–32)
Calcium: 9.5 mg/dL (ref 8.9–10.3)
Chloride: 93 mmol/L — ABNORMAL LOW (ref 98–111)
Creatinine, Ser: 0.83 mg/dL (ref 0.44–1.00)
GFR, Estimated: >60 mL/min (ref >60)
Glucose, Bld: 127 mg/dL — ABNORMAL HIGH (ref 70–99)
Potassium: 4.4 mmol/L (ref 3.5–5.1)
Sodium: 131 mmol/L — ABNORMAL LOW (ref 135–145)
Total Bilirubin: 0.3 mg/dL (ref 0.0–1.2)
Total Protein: 7.2 g/dL (ref 6.5–8.1)

## 2024-10-23 LAB — CBC
HCT: 36.7 % (ref 36.0–46.0)
Hemoglobin: 12.4 g/dL (ref 12.0–15.0)
MCH: 31.5 pg (ref 26.0–34.0)
MCHC: 33.8 g/dL (ref 30.0–36.0)
MCV: 93.1 fL (ref 80.0–100.0)
Platelets: 407 K/uL — ABNORMAL HIGH (ref 150–400)
RBC: 3.94 MIL/uL (ref 3.87–5.11)
RDW: 15.7 % — ABNORMAL HIGH (ref 11.5–15.5)
WBC: 9.1 K/uL (ref 4.0–10.5)
nRBC: 0 % (ref 0.0–0.2)

## 2024-10-23 LAB — MAGNESIUM: Magnesium: 1.9 mg/dL (ref 1.7–2.4)

## 2024-10-23 LAB — LIPASE, BLOOD: Lipase: 198 U/L — ABNORMAL HIGH (ref 11–51)

## 2024-10-23 MED ORDER — ONDANSETRON HCL 4 MG PO TABS: 4.0000 mg | ORAL_TABLET | Freq: Four times a day (QID) | ORAL | Status: DC | PRN

## 2024-10-23 MED ORDER — LACTATED RINGERS IV BOLUS
1000.0000 mL | Freq: Once | INTRAVENOUS | Status: AC
  Administered 2024-10-23: 1000 mL via INTRAVENOUS

## 2024-10-23 MED ORDER — SODIUM CHLORIDE 0.9% FLUSH
3.0000 mL | Freq: Two times a day (BID) | INTRAVENOUS | Status: DC
  Administered 2024-10-23 – 2024-10-26 (×7): 3 mL via INTRAVENOUS

## 2024-10-23 MED ORDER — ACETAMINOPHEN 650 MG RE SUPP: 650.0000 mg | Freq: Four times a day (QID) | RECTAL | Status: DC | PRN

## 2024-10-23 MED ORDER — ACETAMINOPHEN 325 MG PO TABS: 650.0000 mg | ORAL_TABLET | Freq: Four times a day (QID) | ORAL | Status: DC | PRN

## 2024-10-23 MED ORDER — ONDANSETRON HCL 4 MG/2ML IJ SOLN: 4.0000 mg | Freq: Four times a day (QID) | INTRAMUSCULAR | Status: DC | PRN

## 2024-10-23 MED ORDER — LACTATED RINGERS IV SOLN: INTRAVENOUS | Status: AC

## 2024-10-23 MED ORDER — HEPARIN SODIUM (PORCINE) 5000 UNIT/ML IJ SOLN
5000.0000 [IU] | Freq: Three times a day (TID) | INTRAMUSCULAR | Status: DC
  Administered 2024-10-23 – 2024-10-26 (×8): 5000 [IU] via SUBCUTANEOUS
  Filled 2024-10-23 (×8): qty 1

## 2024-10-23 MED ORDER — IOHEXOL 300 MG/ML  SOLN
100.0000 mL | Freq: Once | INTRAMUSCULAR | Status: AC | PRN
  Administered 2024-10-23: 100 mL via INTRAVENOUS

## 2024-10-23 NOTE — ED Provider Notes (Signed)
 [facility] Provider Note    Event Date/Time   First MD Initiated Contact with Patient 10/23/24 1144     (approximate)   History   Chief Complaint: Abdominal Pain   HPI  Anna Castaneda is a 78 y.o. female with a history of GERD, SBO, hypertension, cecal volvulus post right hemicolectomy on 09/29/2024 who comes ED complaining of abdominal distention and worsening since yesterday.  She attributes this to having to fast for 2 days in a row in advance of clinic follow-up.  Denies fever.  No chest pain or shortness of breath        Past Medical History:  Diagnosis Date   Arthritis 2018   GERD (gastroesophageal reflux disease) 2005   Hyperlipidemia    Hypertension    Myocardial infarction (HCC) 02/01/2016   Broken Heart Syndrome   Osteoporosis 2018    Current Outpatient Rx   Order #: [id number] Class: Historical Med   Order #: [id number] Class: Historical Med   Order #: [id number] Class: Historical Med   Order #: [id number] Class: Historical Med   Order #: [id number] Class: Historical Med   Order #: [id number] Class: No Print   Order #: [id number] Class: Historical Med   Order #: [id number] Class: Historical Med   Order #: [id number] Class: Historical Med   Order #: [id number] Class: Historical Med   Order #: [id number] Class: Historical Med   Order #: [id number] Class: Historical Med   Order #: [id number] Class: Historical Med   Order #: [id number] Class: Historical Med   Order #: [id number] Class: Historical Med   Order #: [id number] Class: Historical Med   Order #: [id number] Class: Historical Med   Order #: [id number] Class: Historical Med   Order #: [id number] Class: Historical Med   Order #: [id number] Class: Historical Med   Order #: [id number] Class: Historical Med   Order #: [id number] Class: Historical Med   Order #: [id number] Class: Historical Med   Order #: [id number] Class: Historical Med   Order #: [id number] Class: Historical Med   Order #: [id number] Class: Historical Med    Order #: [id number] Class: Historical Med   Order #: [id number] Class: Historical Med   Order #: [id number] Class: Normal   Order #: [id number] Class: Historical Med    Past Surgical History:  Procedure Laterality Date   APPLICATION OF WOUND VAC  09/29/2024   Procedure: APPLICATION, WOUND VAC;  Surgeon: Rodolph Romano, MD;  Location: [facility] ORS;  Service: General;;   CATARACT EXTRACTION Bilateral 2004   COLONOSCOPY WITH PROPOFOL  N/A 09/02/2019   Procedure: COLONOSCOPY WITH PROPOFOL ;  Surgeon: Toledo, Ladell POUR, MD;  Location: [facility];  Service: Gastroenterology;  Laterality: N/A;   LAPAROTOMY N/A 09/29/2024   Procedure: LAPAROTOMY, EXPLORATORY with COLECTOMY;  Surgeon: Rodolph Romano, MD;  Location: [facility] OR[facility];  Service: General;  Laterality: N/A;   TONSILLECTOMY Bilateral 1954   TUBAL LIGATION  1980    Physical Exam   Triage Vital Signs: ED Triage Vitals  Encounter Vitals Group     BP 10/23/24 1056 138/74     Girls Systolic BP Percentile --      Girls Diastolic BP Percentile --      Boys Systolic BP Percentile --      Boys Diastolic BP Percentile --      Pulse Rate 10/23/24 1056 91     Resp 10/23/24 1056 17     Temp 10/23/24 1056 98 F (36.7 C)     Temp src --      SpO2 10/23/24 1056 100 %     Weight 10/23/24 1054 113 lb (51.3 kg)     Height  10/23/24 1054 5' 4 (1.626 m)     Head Circumference --      Peak Flow --      Pain Score 10/23/24 1054 3     Pain Loc --      Pain Education --      Exclude from Growth Chart --     Most recent vital signs: Vitals:   10/23/24 1056  BP: 138/74  Pulse: 91  Resp: 17  Temp: 98 F (36.7 C)  SpO2: 100%    General: Awake, no distress.  CV:  Good peripheral perfusion.  Regular rate and rhythm Resp:  Normal effort.  Clear lungs Abd:  Moderately distended with tympany to percussion.  Nontender Other:  Moist oral mucosa   ED Results / Procedures / Treatments   Labs (all labs ordered are listed, but only  abnormal results are displayed) Labs Reviewed  LIPASE, BLOOD - Abnormal; Notable for the following components:      Result Value   Lipase 198 (*)    All other components within normal limits  COMPREHENSIVE METABOLIC PANEL WITH GFR - Abnormal; Notable for the following components:   Sodium 131 (*)    Chloride 93 (*)    Glucose, Bld 127 (*)    All other components within normal limits  CBC - Abnormal; Notable for the following components:   RDW 15.7 (*)    Platelets 407 (*)    All other components within normal limits  URINALYSIS, ROUTINE W REFLEX MICROSCOPIC     EKG    RADIOLOGY CT abdomen pelvis interpreted by me, shows markedly distended stomach and small bowel with fecalization, concerning for SBO.  Radiology report reviewed   PROCEDURES:  Procedures   MEDICATIONS ORDERED IN ED: Medications  lactated ringers  bolus 1,000 mL (has no administration in time range)  iohexol  (OMNIPAQUE ) 300 MG/ML solution 100 mL (100 mLs Intravenous Contrast Given 10/23/24 1236)     IMPRESSION / MDM / ASSESSMENT AND PLAN / ED COURSE  I reviewed the triage vital signs and the nursing notes.  DDx: Bowel obstruction, GI perforation, intestinal gas bloating, constipation, AKI, electrolyte derangement  Patient's presentation is most consistent with acute presentation with potential threat to life or bodily function.  Patient presents with generalized abdominal pain and distention 3 weeks after right hemicolectomy for cecal volvulus.  Nontoxic, not in severe pain.  Vital signs are normal.  Will need CT to evaluate for SBO or other potential postsurgical complication.   Clinical Course as of 10/23/24 1417  Wed Oct 23, 2024  1415 CT concerning for SBO.  Also has elevated lipase.  Presentation not consistent with choledocholithiasis.  Discussed with surgery team, PA is at bedside evaluating patient.  Will plan to admit to hospitalist, continue IV fluids for hydration, NG tube, especially with CT  evidence of possible aspiration.  Patient has no respiratory symptoms. [PS]    Clinical Course User Index [PS] Viviann Pastor, MD     FINAL CLINICAL IMPRESSION(S) / ED DIAGNOSES   Final diagnoses:  SBO (small bowel obstruction) (HCC)     Rx / DC Orders   ED Discharge Orders     None        Note:  This document was prepared using Dragon voice recognition software and may include unintentional dictation errors.   Viviann Pastor, MD 10/23/24 [phone]

## 2024-10-23 NOTE — H&P (Addendum)
 History and Physical    Anna Castaneda FMW:[id number] DOB: 1945-12-10 DOA: 10/23/2024  DOS: the patient was seen and examined on 10/23/2024  PCP: Sadie Manna, MD   Patient coming from: Home  I have personally briefly reviewed patient's old medical records in [facility] and CareEverywhere  HPI:   [name] is a 78 y.o. year old female with medical history of hypertension, hyperlipidemia, history of TIA, history of small bowel infection secondary to cecal volvulus status post right hemicolectomy presented to the ED with worsening abdominal distention.  Patient states symptoms started overnight with worsening abdominal distention.  She also has been constipated for the last few days.  She denies any URI symptoms and any fever or chills.   On arrival to the ED patient was noted to be HDS stable.  Lab work and imaging obtained.  CBC with no leukocytosis and normal hemoglobin with mild thrombocytosis.  BMP with mild hyponatremia and hyperglycemia.  Lipase slightly elevated at 128.  CT abdomen pelvis shows concern for small bowel obstruction versus ileus.  Given this, [facility] contacted for admission.  Review of Systems: As mentioned in the history of present illness. All other systems reviewed and are negative.   Past Medical History:  Diagnosis Date   Arthritis 2018   GERD (gastroesophageal reflux disease) 2005   Hyperlipidemia    Hypertension    Myocardial infarction (HCC) 02/01/2016   Broken Heart Syndrome   Osteoporosis 2018    Past Surgical History:  Procedure Laterality Date   APPLICATION OF WOUND VAC  09/29/2024   Procedure: APPLICATION, WOUND VAC;  Surgeon: Rodolph Romano, MD;  Location: [facility];  Service: General;;   CATARACT EXTRACTION Bilateral 2004   COLONOSCOPY WITH PROPOFOL  N/A 09/02/2019   Procedure: COLONOSCOPY WITH PROPOFOL ;  Surgeon: Toledo, Ladell POUR, MD;  Location: [facility];  Service: Gastroenterology;  Laterality: N/A;   LAPAROTOMY N/A  09/29/2024   Procedure: LAPAROTOMY, EXPLORATORY with COLECTOMY;  Surgeon: Rodolph Romano, MD;  Location: [facility];  Service: General;  Laterality: N/A;   TONSILLECTOMY Bilateral 1954   TUBAL LIGATION  1980     No Active Allergies  Family History  Problem Relation Age of Onset   Breast cancer Neg Hx     Prior to Admission medications   Medication Sig Start Date End Date Taking? Authorizing Provider  ALPHA-D-GALACTOSIDASE PO Take 300 Units by mouth.    [provider]  aluminum hydroxide-magnesium carbonate (GAVISCON) 95-358 mg/15 mL SUSP Take by mouth every evening. nightly    [provider]  Ascorbic Acid (VITAMIN C) 1000 MG tablet Take 1,000 mg by mouth daily.    [provider]  aspirin  EC 81 MG tablet Take 81 mg by mouth daily.    [provider]  atorvastatin  (LIPITOR) 20 MG tablet Take 20 mg by mouth daily.    [provider]  Biotin  5 MG TABS Take 1 tablet (5 mg total) by mouth daily. 11/14/23   Darci Pore, MD  bismuth subsalicylate (PEPTO BISMOL) 262 MG/15ML suspension Take by mouth.    [provider]  CALCIUM  LACTATE PO Take by mouth.    [provider]  cetirizine (ZYRTEC) 10 MG tablet Take 10 mg by mouth daily as needed for allergies.     [provider]  Cholecalciferol (VITAMIN D3) 50 MCG (2000 UT) capsule Take 2,000 Units by mouth daily.    [provider]  clobetasol cream (TEMOVATE) 0.05 % Apply 1 Application topically 2 (two) times daily. 09/23/24  [provider]  co-enzyme Q-10 50 MG capsule Take 200 mg by mouth daily. Ubidecarenone (Co-Enzyme Q-10, Ubiquinone) 200 tablet    [provider]  cyanocobalamin (VITAMIN B12) 500 MCG tablet Take 1,000 mcg by mouth daily.    [provider]  denosumab (PROLIA) 60 MG/ML SOSY injection Inject 60 mg into the skin every 6 (six) months.    [provider]  FERROUS FUM-IRON POLYSACCH PO Take by  mouth. Iron fum, poly #1-vitC-L.casei 130 mg iron-25 mg-30 mg Cap    [provider]  ketoconazole (NIZORAL) 2 % cream Apply 1 Application topically 2 (two) times daily. 10/05/23   [provider]  lisinopril (ZESTRIL) 10 MG tablet Take 10 mg by mouth daily. 10/23/23   [provider]  Multiple Vitamin (MULTIVITAMIN PO) Take by mouth. Multivitamin with B complex-vitamin C (FARBEE WITH C) tablet    [provider]  multivitamin-lutein (OCUVITE-LUTEIN) CAPS capsule Take 1 capsule by mouth 2 (two) times daily.    [provider]  niacinamide 500 MG tablet Take 500 mg by mouth daily.    [provider]  NON FORMULARY Take by mouth gelatin sponge, absorb/porcine (Gelatine Absorbable MISC)    [provider]  NON FORMULARY Take by mouth C, E, zinc, copper 11/omega3s/lut (OCUVITE ADULT50+ ORAL)    [provider]  NON FORMULARY Benefiber, Guar gum, Oral    [provider]  NON FORMULARY Calm with Calcium     [provider]  NON FORMULARY CocoaVia    [provider]  pantoprazole  (PROTONIX ) 20 MG tablet Take 20 mg by mouth daily.    [provider]  pimecrolimus (ELIDEL) 1 % cream Apply 1 Application topically 2 (two) times daily.    [provider]  protein supplement (RESOURCE BENEPROTEIN) POWD Take 2 Scoops by mouth 2 (two) times daily.    [provider]  saline (AYR) GEL Place 1 Application into both nostrils every 4 (four) hours as needed. 08/23/23   Abdul Fine, MD  SIMETHICONE PO Take 1 tablet by mouth every 6 (six) hours as needed (flutulence). GAS-X ORAL)     [provider]    Social History:  reports that she quit smoking about 35 years ago. Her smoking use included cigarettes. She started smoking about 50 years ago. She has a 15 pack-year smoking history. She has never used smokeless tobacco. She reports current alcohol use of about 7.0 standard drinks of  alcohol per week. She reports that she does not use drugs.    Physical Exam: Vitals:   10/23/24 1054 10/23/24 1056  BP:  138/74  Pulse:  91  Resp:  17  Temp:  98 F (36.7 C)  SpO2:  100%  Weight: 51.3 kg   Height: 5' 4 (1.626 m)     Gen: NAD HENT: NCAT, dry MM CV: normal heart sounds Lung: CTAB Abd: Mild TTP, diminished bowel sounds MSK: No asymmetry, good bulk and tone Neuro: alert and oriented   Labs on Admission: I have personally reviewed following labs and imaging studies  CBC: Recent Labs  Lab 10/23/24 1057  WBC 9.1  HGB 12.4  HCT 36.7  MCV 93.1  PLT 407*   Basic Metabolic Panel: Recent Labs  Lab 10/23/24 1057  NA 131*  K 4.4  CL 93*  CO2 26  GLUCOSE 127*  BUN 11  CREATININE 0.83  CALCIUM  9.5   GFR: Estimated Creatinine Clearance: 45.2 mL/min (by C-G formula based on SCr of 0.83  mg/dL). Liver Function Tests: Recent Labs  Lab 10/23/24 1057  AST 22  ALT 14  ALKPHOS 51  BILITOT 0.3  PROT 7.2  ALBUMIN 4.3   Recent Labs  Lab 10/23/24 1057  LIPASE 198*   No results for input(s): AMMONIA in the last 168 hours. Coagulation Profile: No results for input(s): INR, PROTIME in the last 168 hours. Cardiac Enzymes: No results for input(s): CKTOTAL, CKMB, CKMBINDEX, TROPONINI, TROPONINIHS in the last 168 hours. BNP (last 3 results) No results for input(s): BNP in the last 8760 hours. HbA1C: No results for input(s): HGBA1C in the last 72 hours. CBG: No results for input(s): GLUCAP in the last 168 hours. Lipid Profile: No results for input(s): CHOL, HDL, LDLCALC, TRIG, CHOLHDL, LDLDIRECT in the last 72 hours. Thyroid Function Tests: No results for input(s): TSH, T4TOTAL, FREET4, T3FREE, THYROIDAB in the last 72 hours. Anemia Panel: No results for input(s): VITAMINB12, FOLATE, FERRITIN, TIBC, IRON, RETICCTPCT in the last 72 hours. Urine analysis:    Component Value Date/Time    COLORURINE STRAW (A) 11/11/2023 1615   APPEARANCEUR CLEAR (A) 11/11/2023 1615   LABSPEC 1.006 11/11/2023 1615   PHURINE 8.0 11/11/2023 1615   GLUCOSEU NEGATIVE 11/11/2023 1615   HGBUR MODERATE (A) 11/11/2023 1615   BILIRUBINUR NEGATIVE 11/11/2023 1615   KETONESUR 5 (A) 11/11/2023 1615   PROTEINUR 100 (A) 11/11/2023 1615   NITRITE NEGATIVE 11/11/2023 1615   LEUKOCYTESUR NEGATIVE 11/11/2023 1615    Radiological Exams on Admission: I have personally reviewed images CT ABDOMEN PELVIS W CONTRAST Result Date: 10/23/2024 EXAM: CT ABDOMEN AND PELVIS WITH CONTRAST 10/23/2024 01:00:37 PM TECHNIQUE: CT of the abdomen and pelvis was performed with the administration of 100 mL iohexol  (OMNIPAQUE ) 300 MG/ML solution. Multiplanar reformatted images are provided for review. Automated exposure control, iterative reconstruction, and/or weight-based adjustment of the mA/kV was utilized to reduce the radiation dose to as low as reasonably achievable. COMPARISON: 19 days ago. CLINICAL HISTORY: Bowel obstruction suspected. FINDINGS: LOWER CHEST: Possible aspirated material seen in left lower lobe bronchi. LIVER: The liver is unremarkable. GALLBLADDER AND BILE DUCTS: Gallbladder is unremarkable. No biliary ductal dilatation. SPLEEN: No acute abnormality. PANCREAS: No acute abnormality. ADRENAL GLANDS: No acute abnormality. KIDNEYS, URETERS AND BLADDER: Left renal atrophy is noted with multiple cysts and scarring. No stones in the kidneys or ureters. No hydronephrosis. No perinephric or periureteral stranding. Urinary bladder is unremarkable. GI AND BOWEL: Moderately dilated esophagus is noted. Moderate gastric distention. Mildly dilated large and small bowel loops are noted suggesting possible ileus or obstruction. Postsurgical changes are seen involving small bowel loops. Stool is seen in multiple large bowel loops. There may be some fecalization seen within small bowel loops suggesting obstruction. Possibility of some  degree of malrotation cannot be excluded. PERITONEUM AND RETROPERITONEUM: No ascites. No free air. VASCULATURE: Aorta is normal in caliber. Aortic atherosclerosis. LYMPH NODES: No lymphadenopathy. REPRODUCTIVE ORGANS: No acute abnormality. BONES AND SOFT TISSUES: No acute osseous abnormality. No focal soft tissue abnormality. IMPRESSION: 1. Mildly dilated large and small bowel loops with possible fecalization, suggesting ileus or obstruction. 2. Surgical changes involving small bowel loops. 3. Possible malrotation. 4. Possible aspirated material seen in left lower lobe bronchi. Electronically signed by: Lynwood Seip MD 10/23/2024 01:42 PM EST RP Workstation: [id number]    EKG: My personal interpretation of EKG shows: Pending  Assessment/Plan Principal Problem:   Small bowel obstruction (HCC) Active Problems:   Essential hypertension   GERD (gastroesophageal reflux disease)   Seizures (HCC)   History  of TIA (transient ischemic attack)   SBO vs ileus.  Pt with symptoms of abdominal distention, nausea and  imaging concern of SBO vs ileius. Pt is currently NPO and has NG tube placed for decomopression. No current need for surgical intervention per general surgery.  Will perform serial abdominal exam. There is also concern of malrotation vs gastric outlet obstruction given her image findings and general surgery has ordered upper GI radiography.   Hypertension: Holding p.o. meds given patient is n.p.o. and is normotensive.  Hyponatremia: Continue to monitor.  This is likely secondary to decreased oral intake given hypochloremia as well.  She is on IVF.  Lipase elevation: Likely secondary to dehydration as pancreas is unremarkable on CT imaging.  Hyperlipidemia/history of TIA: Will continue statin as patient is tolerating p.o. but holding aspirin  in case he needs surgical intervention.  History of seizures: Discussed thought to be provoked secondary to severe hyponatremia.  Patient was on Keppra  but  this was weaned off by neurology.  Will add seizure precautions.  VTE prophylaxis:  SQ Heparin   Diet: N.p.o. Code Status:  Full Code Telemetry:  Admission status: Inpatient, Progressive Patient is from: Home Anticipated d/c is to: Home Anticipated d/c is in: 2-3 days   Family Communication: Updated at bedside  Consults called: None   Severity of Illness: The appropriate patient status for this patient is INPATIENT. Inpatient status is judged to be reasonable and necessary in order to provide the required intensity of service to ensure the patient's safety. The patient's presenting symptoms, physical exam findings, and initial radiographic and laboratory data in the context of their chronic comorbidities is felt to place them at high risk for further clinical deterioration. Furthermore, it is not anticipated that the patient will be medically stable for discharge from the hospital within 2 midnights of admission.   * I certify that at the point of admission it is my clinical judgment that the patient will require inpatient hospital care spanning beyond 2 midnights from the point of admission due to high intensity of service, high risk for further deterioration and high frequency of surveillance required.DEWAINE Morene Bathe, MD [place] DEL. [facility]

## 2024-10-23 NOTE — ED Notes (Signed)
 Called [facility] to verify they have this pt on their monitor and it was confirmed that it was.

## 2024-10-23 NOTE — Progress Notes (Signed)
 Patient brought in from triage.  She is approximately 2 weeks postop for volvulus status post colectomy.  Presenting with malaise and abdominal distention today.  Concern for recurrent obstruction.  Transferring to the emergency department for comprehensive evaluation.

## 2024-10-23 NOTE — ED Triage Notes (Signed)
 Pt comes with c/o belly pain following a recent surgery. Pt states she had to fast yesterday but then the clinic wasn't open and so now today she is all messed up. Pt states her belly is distended.

## 2024-10-23 NOTE — Consult Note (Signed)
 [facility]-General Surgery  SURGICAL CONSULTATION NOTE    HISTORY OF PRESENT ILLNESS (HPI):  78 y.o. female presented to [facility] ED today for evaluation of abdominal distention. 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  She developed a postop ileus, no other complications and was discharged postop day 8. Patient has been experiencing worsening abdominal distention since yesterday. She had an outpatient visit with us  yesterday to have skin staples removed with no complaints. Reports having a bowel movement 2 days ago but feels constipated since. After taking lactulose  she had a very small bowel movement this morning.  Patient denies any fevers, chills, abdominal pain, nausea or vomiting.  In the ED, patient was normotensive, afebrile with HR of 91.  Overall stable, no signs of acute abdomen on exam.  No leukocytosis indicated.  Normal LFTs, alkaline phos and total bili.  Noted some electrolyte disturbance. Lipase elevated 198.  CT of abdomen and pelvis indicated dilated large and small bowel with possible fecalization suggesting ileus or obstruction.  Malrotation could not be ruled out on imaging.  Surgery is consulted by Dr. Viviann in this context for evaluation and management partial small bowel obstruction versus ileus.  PAST MEDICAL HISTORY (PMH):  Past Medical History:  Diagnosis Date   Arthritis 2018   GERD (gastroesophageal reflux disease) 2005   Hyperlipidemia    Hypertension    Myocardial infarction (HCC) 02/01/2016   Broken Heart Syndrome   Osteoporosis 2018     PAST SURGICAL HISTORY (PSH):  Past Surgical History:  Procedure Laterality Date   APPLICATION OF WOUND VAC  09/29/2024   Procedure: APPLICATION, WOUND VAC;  Surgeon: Rodolph Romano, MD;  Location: [facility] ORS;  Service: General;;   CATARACT EXTRACTION Bilateral 2004   COLONOSCOPY WITH PROPOFOL  N/A 09/02/2019   Procedure: COLONOSCOPY WITH  PROPOFOL ;  Surgeon: Toledo, Ladell POUR, MD;  Location: [facility] ENDOSCOPY;  Service: Gastroenterology;  Laterality: N/A;   LAPAROTOMY N/A 09/29/2024   Procedure: LAPAROTOMY, EXPLORATORY with COLECTOMY;  Surgeon: Rodolph Romano, MD;  Location: [facility] ORS;  Service: General;  Laterality: N/A;   TONSILLECTOMY Bilateral 1954   TUBAL LIGATION  1980     MEDICATIONS:  Prior to Admission medications   Medication Sig Start Date End Date Taking? Authorizing Provider  ALPHA-D-GALACTOSIDASE PO Take 300 Units by mouth.    [provider]  aluminum hydroxide-magnesium carbonate (GAVISCON) 95-358 mg/15 mL SUSP Take by mouth every evening. nightly    [provider]  Ascorbic Acid (VITAMIN C) 1000 MG tablet Take 1,000 mg by mouth daily.    [provider]  aspirin  EC 81 MG tablet Take 81 mg by mouth daily.    [provider]  atorvastatin  (LIPITOR) 20 MG tablet Take 20 mg by mouth daily.    [provider]  Biotin  5 MG TABS Take 1 tablet (5 mg total) by mouth daily. 11/14/23   Darci Pore, MD  bismuth subsalicylate (PEPTO BISMOL) 262 MG/15ML suspension Take by mouth.    [provider]  CALCIUM  LACTATE PO Take by mouth.    [provider]  cetirizine (ZYRTEC) 10 MG tablet Take 10 mg by mouth daily as needed for allergies.     [provider]  Cholecalciferol (VITAMIN D3) 50 MCG (2000 UT) capsule Take 2,000 Units by mouth daily.    [provider]  clobetasol cream (TEMOVATE) 0.05 % Apply 1 Application topically 2 (two) times daily. 09/23/24   [provider]  co-enzyme  Q-10 50 MG capsule Take 200 mg by mouth daily. Ubidecarenone (Co-Enzyme Q-10, Ubiquinone) 200 tablet    [provider]  cyanocobalamin (VITAMIN B12) 500 MCG tablet Take 1,000 mcg by mouth daily.    [provider]  denosumab (PROLIA) 60 MG/ML SOSY injection Inject 60 mg into the skin every 6 (six) months.    [provider]  FERROUS FUM-IRON POLYSACCH PO Take by mouth. Iron fum, poly #1-vitC-L.casei 130 mg iron-25 mg-30 mg Cap    [provider]  ketoconazole (NIZORAL) 2 % cream Apply 1 Application topically 2 (two) times daily. 10/05/23   [provider]  lisinopril (ZESTRIL) 10 MG tablet Take 10 mg by mouth daily. 10/23/23   [provider]  Multiple Vitamin (MULTIVITAMIN PO) Take by mouth. Multivitamin with B complex-vitamin C (FARBEE WITH C) tablet    [provider]  multivitamin-lutein (OCUVITE-LUTEIN) CAPS capsule Take 1 capsule by mouth 2 (two) times daily.    [provider]  niacinamide 500 MG tablet Take 500 mg by mouth daily.    [provider]  NON FORMULARY Take by mouth gelatin sponge, absorb/porcine (Gelatine Absorbable MISC)    [provider]  NON FORMULARY Take by mouth C, E, zinc, copper 11/omega3s/lut (OCUVITE ADULT50+ ORAL)    [provider]  NON FORMULARY Benefiber, Guar gum, Oral    [provider]  NON FORMULARY Calm with Calcium     [provider]  NON FORMULARY CocoaVia    [provider]  pantoprazole  (PROTONIX ) 20 MG tablet Take 20 mg by mouth daily.    [provider]  pimecrolimus (ELIDEL) 1 % cream Apply 1 Application topically 2 (two) times daily.    [provider]  protein supplement (RESOURCE BENEPROTEIN) POWD Take 2 Scoops by mouth 2 (two) times daily.    [provider]  saline (AYR) GEL Place 1 Application into both nostrils every 4 (four) hours as needed. 08/23/23   Abdul Fine, MD  SIMETHICONE PO Take 1 tablet by mouth every 6 (six) hours as needed (flutulence). GAS-X ORAL)     [provider]     ALLERGIES:  No Active Allergies   SOCIAL HISTORY:  Social History   Socioeconomic History   Marital status: Widowed    Spouse name: Not on file   Number of children: Not on file   Years of education: Not on file   Highest  education level: Bachelor's degree (e.g., BA, AB, BS)  Occupational History   Not on file  Tobacco Use   Smoking status: Former    Current packs/day: 0.00    Average packs/day: 1 pack/day for 15.0 years (15.0 ttl pk-yrs)    Types: Cigarettes    Start date: 04/23/1974    Quit date: 04/23/1989    Years since quitting: 35.5   Smokeless tobacco: Never   Tobacco comments:    smoker: 07/03/1974 - 04/23/1989  Vaping Use   Vaping status: Never Used  Substance and Sexual Activity   Alcohol use: Yes    Alcohol/week: 7.0 standard drinks of alcohol    Types: 7 Glasses of wine per week   Drug use: Never   Sexual activity: Not Currently  Other Topics Concern   Not on file  Social History Narrative   Not on file   Social Drivers of Health   Financial Resource Strain: Low Risk  (06/07/2024)   Received from [facility]   Overall Financial Resource Strain (CARDIA)  Difficulty of Paying Living Expenses: Not hard at all  Food Insecurity: No Food Insecurity (09/29/2024)   Hunger Vital Sign    Worried About Running Out of Food in the Last Year: Never true    Ran Out of Food in the Last Year: Never true  Transportation Needs: No Transportation Needs (09/29/2024)   PRAPARE - Administrator, Civil Service (Medical): No    Lack of Transportation (Non-Medical): No  Physical Activity: Sufficiently Active (08/22/2023)   Exercise Vital Sign    Days of Exercise per Week: 7 days    Minutes of Exercise per Session: 60 min  Stress: No Stress Concern Present (08/22/2023)   [employer] of Occupational Health - Occupational Stress Questionnaire    Feeling of Stress : Not at all  Social Connections: Moderately Integrated (09/29/2024)   Social Connection and Isolation Panel    Frequency of Communication with Friends and Family: More than three times a week    Frequency of Social Gatherings with Friends and Family: More than three times a week    Attends Religious Services:  1 to 4 times per year    Active Member of [employer] or Organizations: Yes    Attends Banker Meetings: More than 4 times per year    Marital Status: Widowed  Intimate Partner Violence: Not At Risk (09/29/2024)   Humiliation, Afraid, Rape, and Kick questionnaire    Fear of Current or Ex-Partner: No    Emotionally Abused: No    Physically Abused: No    Sexually Abused: No     FAMILY HISTORY:  Family History  Problem Relation Age of Onset   Breast cancer Neg Hx       REVIEW OF SYSTEMS:  Review of Systems  Constitutional:  Negative for chills and fever.  Respiratory:  Negative for cough and wheezing.   Cardiovascular:  Negative for chest pain and palpitations.  Gastrointestinal:  Positive for constipation. Negative for abdominal pain, nausea and vomiting.    VITAL SIGNS:  Temp:  [98 F (36.7 C)] 98 F (36.7 C) (12/10 1056) Pulse Rate:  [91] 91 (12/10 1056) Resp:  [17] 17 (12/10 1056) BP: (138)/(74) 138/74 (12/10 1056) SpO2:  [100 %] 100 % (12/10 1056) Weight:  [51.3 kg] 51.3 kg (12/10 1054)     Height: 5' 4 (162.6 cm) Weight: 51.3 kg BMI (Calculated): 19.39   INTAKE/OUTPUT:  No intake/output data recorded.  PHYSICAL EXAM:  Physical Exam Constitutional:      Appearance: She is well-developed.  HENT:     Head: Normocephalic and atraumatic.  Cardiovascular:     Rate and Rhythm: Normal rate and regular rhythm.  Pulmonary:     Effort: Pulmonary effort is normal.     Breath sounds: Normal breath sounds.  Abdominal:     General: Abdomen is flat. Bowel sounds are normal. There is distension.     Palpations: Abdomen is soft.     Tenderness: There is no abdominal tenderness. There is no guarding.     Comments: Tympanic on exam  Neurological:     Mental Status: She is alert.     Labs:     Latest Ref Rng & Units 10/23/2024   10:57 AM 10/07/2024    4:31 AM 10/06/2024    3:47 AM  CBC  WBC 4.0 - 10.5 K/uL 9.1  8.2  6.1   Hemoglobin 12.0 - 15.0 g/dL 87.5   87.2  87.8   Hematocrit 36.0 - 46.0 % 36.7  37.3  34.4   Platelets 150 - 400 K/uL 407  320  278       Latest Ref Rng & Units 10/23/2024   10:57 AM 10/07/2024    4:31 AM 10/06/2024    3:47 AM  CMP  Glucose 70 - 99 mg/dL 872  894  869   BUN 8 - 23 mg/dL 11  17  16    Creatinine 0.44 - 1.00 mg/dL 9.16  9.35  9.49   Sodium 135 - 145 mmol/L 131  135  135   Potassium 3.5 - 5.1 mmol/L 4.4  4.5  4.4   Chloride 98 - 111 mmol/L 93  101  103   CO2 22 - 32 mmol/L 26  25  25    Calcium  8.9 - 10.3 mg/dL 9.5  8.6  8.4   Total Protein 6.5 - 8.1 g/dL 7.2     Total Bilirubin 0.0 - 1.2 mg/dL 0.3     Alkaline Phos 38 - 126 U/L 51     AST 15 - 41 U/L 22     ALT 0 - 44 U/L 14       Imaging studies:  EXAM: CT ABDOMEN AND PELVIS WITH CONTRAST 10/23/2024 01:00:37 PM   TECHNIQUE: CT of the abdomen and pelvis was performed with the administration of 100 mL iohexol  (OMNIPAQUE ) 300 MG/ML solution. Multiplanar reformatted images are provided for review. Automated exposure control, iterative reconstruction, and/or weight-based adjustment of the mA/kV was utilized to reduce the radiation dose to as low as reasonably achievable.   COMPARISON: 19 days ago.   CLINICAL HISTORY: Bowel obstruction suspected.   FINDINGS:   LOWER CHEST: Possible aspirated material seen in left lower lobe bronchi.   LIVER: The liver is unremarkable.   GALLBLADDER AND BILE DUCTS: Gallbladder is unremarkable. No biliary ductal dilatation.   SPLEEN: No acute abnormality.   PANCREAS: No acute abnormality.   ADRENAL GLANDS: No acute abnormality.   KIDNEYS, URETERS AND BLADDER: Left renal atrophy is noted with multiple cysts and scarring. No stones in the kidneys or ureters. No hydronephrosis. No perinephric or periureteral stranding. Urinary bladder is unremarkable.   GI AND BOWEL: Moderately dilated esophagus is noted. Moderate gastric distention. Mildly dilated large and small bowel loops are noted suggesting  possible ileus or obstruction. Postsurgical changes are seen involving small bowel loops. Stool is seen in multiple large bowel loops. There may be some fecalization seen within small bowel loops suggesting obstruction. Possibility of some degree of malrotation cannot be excluded.   PERITONEUM AND RETROPERITONEUM: No ascites. No free air.   VASCULATURE: Aorta is normal in caliber. Aortic atherosclerosis.   LYMPH NODES: No lymphadenopathy.   REPRODUCTIVE ORGANS: No acute abnormality.   BONES AND SOFT TISSUES: No acute osseous abnormality. No focal soft tissue abnormality.   IMPRESSION: 1. Mildly dilated large and small bowel loops with possible fecalization, suggesting ileus or obstruction. 2. Surgical changes involving small bowel loops. 3. Possible malrotation. 4. Possible aspirated material seen in left lower lobe bronchi.   Electronically signed by: Lynwood Seip MD 10/23/2024 01:42 PM EST RP Workstation: [id number]   Assessment/Plan: 78 y.o. female with partial small bowel obstruction versus ileus, complicated by pertinent comorbidities including 3 weeks status post right hemicolectomy with primary anastomosis, essential hypertension, dyslipidemia, and GERD.   - Stable vital signs, no abdominal pain or signs of acute abdomen on exam. No leukocytosis.No postop complications indicated on imaging.   - Will manage conservatively for now with NG tube, NPO and IV  fluids.   - Continue pain management as needed   - DVT prophylaxis   Thank you for the opportunity to participate in this patient's care.   -- Gilmer Cea PA-C

## 2024-10-24 ENCOUNTER — Inpatient Hospital Stay

## 2024-10-24 LAB — CBC
HCT: 33.7 % — ABNORMAL LOW (ref 36.0–46.0)
Hemoglobin: 11.5 g/dL — ABNORMAL LOW (ref 12.0–15.0)
MCH: 31.6 pg (ref 26.0–34.0)
MCHC: 34.1 g/dL (ref 30.0–36.0)
MCV: 92.6 fL (ref 80.0–100.0)
Platelets: 374 K/uL (ref 150–400)
RBC: 3.64 MIL/uL — ABNORMAL LOW (ref 3.87–5.11)
RDW: 15.7 % — ABNORMAL HIGH (ref 11.5–15.5)
WBC: 5.8 K/uL (ref 4.0–10.5)
nRBC: 0 % (ref 0.0–0.2)

## 2024-10-24 LAB — CBG MONITORING, ED
Glucose-Capillary: 88 mg/dL (ref 70–99)
Glucose-Capillary: 86 mg/dL (ref 70–99)

## 2024-10-24 LAB — BASIC METABOLIC PANEL WITH GFR
Anion gap: 9 (ref 5–15)
BUN: 9 mg/dL (ref 8–23)
CO2: 26 mmol/L (ref 22–32)
Calcium: 8.7 mg/dL — ABNORMAL LOW (ref 8.9–10.3)
Chloride: 100 mmol/L (ref 98–111)
Creatinine, Ser: 0.7 mg/dL (ref 0.44–1.00)
GFR, Estimated: >60 mL/min (ref >60)
Glucose, Bld: 90 mg/dL (ref 70–99)
Potassium: 4.2 mmol/L (ref 3.5–5.1)
Sodium: 135 mmol/L (ref 135–145)

## 2024-10-24 MED ORDER — LACTATED RINGERS IV SOLN: INTRAVENOUS | Status: DC

## 2024-10-24 MED ORDER — IOHEXOL 300 MG/ML  SOLN
150.0000 mL | Freq: Once | INTRAMUSCULAR | Status: AC | PRN
  Administered 2024-10-24: 150 mL

## 2024-10-24 MED ORDER — IOHEXOL 300 MG/ML  SOLN
50.0000 mL | Freq: Once | INTRAMUSCULAR | Status: AC | PRN
  Administered 2024-10-24: 50 mL

## 2024-10-24 NOTE — Progress Notes (Signed)
° ° °  PROCEDURAL EXPEDITER PROGRESS NOTE  Patient Name: Anna Castaneda  DOB:December 15, 1945 Date of Admission: 10/23/2024  Date of Assessment:10/24/2024   ------------------------------------------------------------------------------------------------------------------- Dr. Jerelene responded back via secure chat, ok waiting to see what surgrey recommends   -------------------------------------------------------------------------------------------------------------------  [facility] Expediter, Ronal DELENA Bald Please contact us  directly via secure chat (search for [facility]) or by calling us  at [phone] [facility]).

## 2024-10-24 NOTE — Progress Notes (Signed)
 PROGRESS NOTE    Anna Castaneda  FMW:[id number] DOB: 06-05-1946 DOA: 10/23/2024 PCP: Sadie Manna, MD  Chief Complaint  Patient presents with   Abdominal Pain    Hospital Course:  Anna Castaneda is a 78 y.o. 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 Patient states symptoms started overnight with worsening abdominal distention.  She also has been constipated for the last few days.  Admitted for possible SBO vs ileus  Subjective: Patient was examined at the bedside, new to me today. Denies any complaints today, abdomen appears mildly distended Surgery following, has NGT for decompression   Objective: Vitals:   10/24/24 0200 10/24/24 0530 10/24/24 0601 10/24/24 0800  BP: 117/70 110/64  111/66  Pulse: 65 85  77  Resp: 12 20  11   Temp:   98.1 F (36.7 C)   TempSrc:   Oral   SpO2: 95% 96%  98%  Weight:      Height:       No intake or output data in the 24 hours ending 10/24/24 9078 Filed Weights   10/23/24 1054  Weight: 51.3 kg    Examination: Gen: NAD HENT: NCAT, dry MM CV: normal heart sounds Lung: CTAB Abd: non tender, mildly distended, diminished bowel sounds MSK: No asymmetry, good bulk and tone Neuro: alert and oriented  Assessment & Plan:  SBO vs ileus - Presented with abdominal distention, nausea and imaging concern of SBO vs ileus - NPO, IV fluids - NGT for decompression, if improving plan to clamp NGT tomorrow - General Surgery following, appreciate recs.  No need for urgent surgical intervention - Also concern of malrotation vs gastric outlet obstruction given her image findings, ruled out with upper GI series   Hypertension: controlled Lisinopril held   Hyponatremia - Resolved This is likely secondary to decreased oral intake S/p IV fluids    Lipase elevation: Likely secondary to dehydration as pancreas is unremarkable on CT imaging.   Hyperlipidemia/history of TIA: resume meds when po intake ok per surgery   History of seizures: Patient was on Keppra  but this was weaned off by neurology. seizure precautions    DVT prophylaxis: Heparin  SQ   Code Status: Full Code Disposition:  TBD  Consultants:  Treatment Team:  Consulting Physician: Rodolph Romano, MD  Procedures:  None  Antimicrobials:  Anti-infectives (From admission, onward)    None       Data Reviewed: I have personally reviewed following labs and imaging studies CBC: Recent Labs  Lab 10/23/24 1057 10/24/24 0333  WBC 9.1 5.8  HGB 12.4 11.5*  HCT 36.7 33.7*  MCV 93.1 92.6  PLT 407* 374   Basic Metabolic Panel: Recent Labs  Lab 10/23/24 1057 10/24/24 0333  NA 131* 135  K 4.4 4.2  CL 93* 100  CO2 26 26  GLUCOSE 127* 90  BUN 11 9  CREATININE 0.83 0.70  CALCIUM  9.5 8.7*  MG 1.9  --    GFR: Estimated Creatinine Clearance: 46.9 mL/min (by C-G formula based on SCr of 0.7 mg/dL). Liver Function Tests: Recent Labs  Lab 10/23/24 1057  AST 22  ALT 14  ALKPHOS 51  BILITOT 0.3  PROT 7.2  ALBUMIN 4.3   CBG: Recent Labs  Lab 10/24/24 0737  GLUCAP 88    No results found for this or any previous visit (from the past  240 hours).   Radiology Studies: DG Abd Portable 1 View Result Date: 10/23/2024 EXAM: 1 VIEW XRAY OF THE ABDOMEN 10/23/2024 03:23:02 PM COMPARISON: 19 days ago. CLINICAL HISTORY: Confirm NG tube placement. FINDINGS: LINES, TUBES AND DEVICES: Distal tip of the nasogastric tube is seen in proximal stomach. BOWEL: Nonobstructive bowel gas pattern. SOFT TISSUES: No abnormal calcifications. BONES: No acute fracture. IMPRESSION: 1. Distal tip of the nasogastric tube is in the proximal stomach. Electronically signed by: Lynwood Seip MD 10/23/2024 03:42 PM EST RP Workstation: [id number]   CT ABDOMEN PELVIS W CONTRAST Result  Date: 10/23/2024 EXAM: CT ABDOMEN AND PELVIS WITH CONTRAST 10/23/2024 01:00:37 PM TECHNIQUE: CT of the abdomen and pelvis was performed with the administration of 100 mL iohexol  (OMNIPAQUE ) 300 MG/ML solution. Multiplanar reformatted images are provided for review. Automated exposure control, iterative reconstruction, and/or weight-based adjustment of the mA/kV was utilized to reduce the radiation dose to as low as reasonably achievable. COMPARISON: 19 days ago. CLINICAL HISTORY: Bowel obstruction suspected. FINDINGS: LOWER CHEST: Possible aspirated material seen in left lower lobe bronchi. LIVER: The liver is unremarkable. GALLBLADDER AND BILE DUCTS: Gallbladder is unremarkable. No biliary ductal dilatation. SPLEEN: No acute abnormality. PANCREAS: No acute abnormality. ADRENAL GLANDS: No acute abnormality. KIDNEYS, URETERS AND BLADDER: Left renal atrophy is noted with multiple cysts and scarring. No stones in the kidneys or ureters. No hydronephrosis. No perinephric or periureteral stranding. Urinary bladder is unremarkable. GI AND BOWEL: Moderately dilated esophagus is noted. Moderate gastric distention. Mildly dilated large and small bowel loops are noted suggesting possible ileus or obstruction. Postsurgical changes are seen involving small bowel loops. Stool is seen in multiple large bowel loops. There may be some fecalization seen within small bowel loops suggesting obstruction. Possibility of some degree of malrotation cannot be excluded. PERITONEUM AND RETROPERITONEUM: No ascites. No free air. VASCULATURE: Aorta is normal in caliber. Aortic atherosclerosis. LYMPH NODES: No lymphadenopathy. REPRODUCTIVE ORGANS: No acute abnormality. BONES AND SOFT TISSUES: No acute osseous abnormality. No focal soft tissue abnormality. IMPRESSION: 1. Mildly dilated large and small bowel loops with possible fecalization, suggesting ileus or obstruction. 2. Surgical changes involving small bowel loops. 3. Possible malrotation.  4. Possible aspirated material seen in left lower lobe bronchi. Electronically signed by: Lynwood Seip MD 10/23/2024 01:42 PM EST RP Workstation: [id number]    Scheduled Meds:  heparin   5,000 Units Subcutaneous Q8H   sodium chloride  flush  3 mL Intravenous Q12H   Continuous Infusions:  lactated ringers  75 mL/hr at 10/24/24 0407     LOS: 1 day  MDM: Patient is high risk for one or more organ failure.  They necessitate ongoing hospitalization for continued IV therapies and subsequent lab monitoring. Total time spent interpreting labs and vitals, reviewing the medical record, coordinating care amongst consultants and care team members, directly assessing and discussing care with the patient and/or family: 55 min Laree Lock, MD [facility]  To contact the attending physician between 7A-7P please use Epic Chat. To contact the covering physician during after hours 7P-7A, please review Amion.  10/24/2024, 9:21 AM   *This document has been created with the assistance of dictation software. Please excuse typographical errors. *

## 2024-10-24 NOTE — Progress Notes (Signed)
° ° °  PROCEDURAL EXPEDITER PROGRESS NOTE  Patient Name: Anna Castaneda  DOB:[birth year] Date of Admission: 10/23/2024  Date of Assessment:10/24/2024   -------------------------------------------------------------------------------------------------------------------   Brief clinical summary: Pt has been here 19+ hrs reached out to the attending to see if pt could be downgraded to a tele bed from Progressive.  VSS, Hgb stable.   Reached Dr. Laree Lock, attending by secure chat.   -------------------------------------------------------------------------------------------------------------------  [facility] Expediter, Ronal DELENA Bald Please contact us  directly via secure chat (search for [facility]) or by calling us  at [phone] [facility]).

## 2024-10-24 NOTE — Progress Notes (Signed)
 Patient ID: Anna Castaneda, female   DOB: 08/11/1946, 78 y.o.   MRN: [id number]     SURGICAL PROGRESS NOTE   Hospital Day(s): 1.   Interval History: Patient seen and examined, no acute events or new complaints overnight. Patient reports feeling better and endorses passing gas.  She denies any nausea or vomiting but she has NGT in place.  She denies any significant abdominal pain.  No paralyzation.  Patient can identify any alleviating or aggravating factors.  Patient had a upper GI series that shows no sign of gastric outlet obstruction.  No malrotation was identified.  Vital signs in last 24 hours: [min-max] current  Temp:  [98 F (36.7 C)-98.7 F (37.1 C)] 98.7 F (37.1 C) (12/11 1047) Pulse Rate:  [65-85] 80 (12/11 1100) Resp:  [9-20] 14 (12/11 1100) BP: (110-126)/(63-104) 118/104 (12/11 1100) SpO2:  [93 %-99 %] 99 % (12/11 1100)     Height: 5' 4 (162.6 cm) Weight: 51.3 kg BMI (Calculated): 19.39   Physical Exam:  Constitutional: alert, cooperative and no distress  Respiratory: breathing non-labored at rest  Cardiovascular: regular rate and sinus rhythm  Gastrointestinal: soft, non-tender, and mild-distended  Labs:     Latest Ref Rng & Units 10/24/2024    3:33 AM 10/23/2024   10:57 AM 10/07/2024    4:31 AM  CBC  WBC 4.0 - 10.5 K/uL 5.8  9.1  8.2   Hemoglobin 12.0 - 15.0 g/dL 88.4  87.5  87.2   Hematocrit 36.0 - 46.0 % 33.7  36.7  37.3   Platelets 150 - 400 K/uL 374  407  320       Latest Ref Rng & Units 10/24/2024    3:33 AM 10/23/2024   10:57 AM 10/07/2024    4:31 AM  CMP  Glucose 70 - 99 mg/dL 90  872  894   BUN 8 - 23 mg/dL 9  11  17    Creatinine 0.44 - 1.00 mg/dL 9.29  9.16  9.35   Sodium 135 - 145 mmol/L 135  131  135   Potassium 3.5 - 5.1 mmol/L 4.2  4.4  4.5   Chloride 98 - 111 mmol/L 100  93  101   CO2 22 - 32 mmol/L 26  26  25    Calcium  8.9 - 10.3 mg/dL 8.7  9.5  8.6   Total Protein 6.5 - 8.1 g/dL  7.2    Total Bilirubin 0.0 - 1.2 mg/dL  0.3    Alkaline  Phos 38 - 126 U/L  51    AST 15 - 41 U/L  22    ALT 0 - 44 U/L  14      Imaging studies: Upper GI series today shows no gastric outlet obstruction, normal rotation.  Still with distended small and large intestine consistent with ileus.   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  Patient with history of partial colectomy almost 4 weeks ago.  On physical exam she has always seems distended.  She has been eating and having bowel movement at home.  She was feeling a little bit more uncomfortable 2 days ago and decided to come to the ED and CT scan shows persistent small and large bowel dilation with concern of ileus versus obstruction.  There was also concern of gastric outlet obstruction, possible malrotation due to severe dilation of the stomach.  This was ruled out today with upper GI series.  Due to the persistent dilation, will keep NGT  in place for today.  Will reevaluate tomorrow with new abdominal x-ray.  If patient continue clinically improving will also consider clamping NGT for trial.  Currently with normal electrolytes.  No white blood cell count.  Stable vital signs.  Abdominal exam with mild distention but no tender to palpation.  No indication for urgent surgical intervention.  Lucas Petrin, MD

## 2024-10-25 ENCOUNTER — Inpatient Hospital Stay

## 2024-10-25 LAB — CBC
HCT: 34.8 % — ABNORMAL LOW (ref 36.0–46.0)
Hemoglobin: 11.7 g/dL — ABNORMAL LOW (ref 12.0–15.0)
MCH: 31.4 pg (ref 26.0–34.0)
MCHC: 33.6 g/dL (ref 30.0–36.0)
MCV: 93.3 fL (ref 80.0–100.0)
Platelets: 344 K/uL (ref 150–400)
RBC: 3.73 MIL/uL — ABNORMAL LOW (ref 3.87–5.11)
RDW: 15.9 % — ABNORMAL HIGH (ref 11.5–15.5)
WBC: 7.6 K/uL (ref 4.0–10.5)
nRBC: 0 % (ref 0.0–0.2)

## 2024-10-25 LAB — CBG MONITORING, ED
Glucose-Capillary: 52 mg/dL — ABNORMAL LOW (ref 70–99)
Glucose-Capillary: 55 mg/dL — ABNORMAL LOW (ref 70–99)
Glucose-Capillary: 173 mg/dL — ABNORMAL HIGH (ref 70–99)
Glucose-Capillary: 136 mg/dL — ABNORMAL HIGH (ref 70–99)

## 2024-10-25 LAB — BASIC METABOLIC PANEL WITH GFR
Anion gap: 16 — ABNORMAL HIGH (ref 5–15)
BUN: 13 mg/dL (ref 8–23)
CO2: 23 mmol/L (ref 22–32)
Calcium: 7.6 mg/dL — ABNORMAL LOW (ref 8.9–10.3)
Chloride: 99 mmol/L (ref 98–111)
Creatinine, Ser: 0.65 mg/dL (ref 0.44–1.00)
GFR, Estimated: >60 mL/min (ref >60)
Glucose, Bld: 52 mg/dL — ABNORMAL LOW (ref 70–99)
Potassium: 4.1 mmol/L (ref 3.5–5.1)
Sodium: 138 mmol/L (ref 135–145)

## 2024-10-25 LAB — MAGNESIUM: Magnesium: 2.1 mg/dL (ref 1.7–2.4)

## 2024-10-25 LAB — GLUCOSE, CAPILLARY: Glucose-Capillary: 82 mg/dL (ref 70–99)

## 2024-10-25 MED ORDER — DEXTROSE 50 % IV SOLN
1.0000 | Freq: Once | INTRAVENOUS | Status: AC
  Administered 2024-10-25: 50 mL via INTRAVENOUS

## 2024-10-25 MED ORDER — DEXTROSE 50 % IV SOLN
INTRAVENOUS | Status: AC
  Filled 2024-10-25: qty 50

## 2024-10-25 NOTE — ED Notes (Signed)
NG tube removed by provider

## 2024-10-25 NOTE — ED Notes (Addendum)
 4oz orange juice to pt, NG tube suction paused for absorption. Mansy MD and Rodolph MD notified.

## 2024-10-25 NOTE — ED Notes (Signed)
 Pt walk the area of cpod. No complaints at this time.

## 2024-10-25 NOTE — ED Notes (Signed)
 Pt unhooked from monitoring equipment at this time to use the restroom. Pt ambulates on own to restroom down the hall

## 2024-10-25 NOTE — Progress Notes (Addendum)
° °  Brief Progress Note   _____________________________________________________________________________________________________________  Patient Name: Anna Castaneda Patient DOB: 03-29-1946 Date: 10/25/2024     Data: Patient being admitted SBO. Patient is currently Progressive level of care. Patient has been waiting for Progressive bed since 12/10.    Action: Reached out to Dr Tobie to see if patient could be downgraded.    Response:  Awaiting response from Dr Tobie.  ADDENDUM: Dr Tobie placed order for level of care change to Med Surg.  _____________________________________________________________________________________________________________  [facility] RN Expeditor Anna Castaneda Please contact us  directly via secure chat (search for [facility]) or by calling us  at [phone] [facility]).

## 2024-10-25 NOTE — ED Notes (Signed)
 The pt had consumed her fluid diet order before I was able to turn her suction off to her ng tube. Approx. 800 ml's of gastric contents present in suction canister.

## 2024-10-25 NOTE — Progress Notes (Signed)
 Patient ID: Anna Castaneda, female   DOB: 14-Oct-1946, 77 y.o.   MRN: [id number]     SURGICAL PROGRESS NOTE   Hospital Day(s): 2.   Interval History: Patient seen and examined, no acute events or new complaints overnight. Patient reports feeling better this morning.  She endorses that she had a nice bowel movement.  She denies any nausea or vomiting.  She had an abdominal x-ray this morning that shows improved small bowel dilation, no significant stomach dilation.  All oral contrast seen in the descending, sigmoid and rectum.  Vital signs in last 24 hours: [min-max] current  Temp:  [97.6 F (36.4 C)-98.6 F (37 C)] 97.6 F (36.4 C) (12/12 0751) Pulse Rate:  [69-84] 81 (12/12 0700) Resp:  [13-23] 16 (12/12 0700) BP: (102-123)/(51-78) 111/60 (12/12 0700) SpO2:  [94 %-100 %] 98 % (12/12 0757)     Height: 5' 4 (162.6 cm) Weight: 51.3 kg BMI (Calculated): 19.39   Physical Exam:  Constitutional: alert, cooperative and no distress  Respiratory: breathing non-labored at rest  Cardiovascular: regular rate and sinus rhythm  Gastrointestinal: soft, non-tender, and mild-distended  Labs:     Latest Ref Rng & Units 10/25/2024    5:02 AM 10/24/2024    3:33 AM 10/23/2024   10:57 AM  CBC  WBC 4.0 - 10.5 K/uL 7.6  5.8  9.1   Hemoglobin 12.0 - 15.0 g/dL 88.2  88.4  87.5   Hematocrit 36.0 - 46.0 % 34.8  33.7  36.7   Platelets 150 - 400 K/uL 344  374  407       Latest Ref Rng & Units 10/25/2024    5:02 AM 10/24/2024    3:33 AM 10/23/2024   10:57 AM  CMP  Glucose 70 - 99 mg/dL 52  90  872   BUN 8 - 23 mg/dL 13  9  11    Creatinine 0.44 - 1.00 mg/dL 9.34  9.29  9.16   Sodium 135 - 145 mmol/L 138  135  131   Potassium 3.5 - 5.1 mmol/L 4.1  4.2  4.4   Chloride 98 - 111 mmol/L 99  100  93   CO2 22 - 32 mmol/L 23  26  26    Calcium  8.9 - 10.3 mg/dL 7.6  8.7  9.5   Total Protein 6.5 - 8.1 g/dL   7.2   Total Bilirubin 0.0 - 1.2 mg/dL   0.3   Alkaline Phos 38 - 126 U/L   51   AST 15 - 41 U/L   22    ALT 0 - 44 U/L   14     Imaging studies: Abdominal x-ray this morning shows mild small bowel dilation but all contrast seen in the descending and rectum.   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   - Improved clinically - Patient having bowel movements - She tolerated clear liquid diet - NGT removed.  If she tolerated liquid diet for lunch she will plan to advance to full liquid for supper - Patient ambulating - No indication for urgent surgical intervention at this moment.  Will continue to follow.  Lucas Petrin, MD

## 2024-10-25 NOTE — Progress Notes (Signed)
 [facility]  - [place] at [facility]   PATIENT NAME: Anna Castaneda    MR#:  [id number]  DATE OF BIRTH:  1946-07-17  SUBJECTIVE:  no family at bedside. Patient had a bowel movement today and two bowel movements yesterday. Tolerating clear liquid diet. Seen by general surgery Dr. Rodolph and NG removed. Patient wants to try to ambulate.    VITALS:  Blood pressure (!) 141/66, pulse 78, temperature 97.6 F (36.4 C), temperature source Oral, resp. rate 17, height 5' 4 (1.626 m), weight 51.3 kg, SpO2 99%.  PHYSICAL EXAMINATION:   GENERAL:  79 y.o.-year-old patient with no acute distress.  LUNGS: Normal breath sounds bilaterally, no wheezing CARDIOVASCULAR: S1, S2 normal. No murmur   ABDOMEN: Soft, nontender, nondistended. Bowel sounds present.  EXTREMITIES: No  edema b/l.    NEUROLOGIC: nonfocal  patient is alert and awake SKIN: No obvious rash, lesion, or ulcer.   LABORATORY PANEL:  CBC Recent Labs  Lab 10/25/24 0502  WBC 7.6  HGB 11.7*  HCT 34.8*  PLT 344    Chemistries  Recent Labs  Lab 10/23/24 1057 10/24/24 0333 10/25/24 0502  NA 131*   < > 138  K 4.4   < > 4.1  CL 93*   < > 99  CO2 26   < > 23  GLUCOSE 127*   < > 52*  BUN 11   < > 13  CREATININE 0.83   < > 0.65  CALCIUM  9.5   < > 7.6*  MG 1.9  --  2.1  AST 22  --   --   ALT 14  --   --   ALKPHOS 51  --   --   BILITOT 0.3  --   --    < > = values in this interval not displayed.   Cardiac Enzymes No results for input(s): TROPONINI in the last 168 hours. RADIOLOGY:  DG Abd 2 Views Result Date: 10/25/2024 CLINICAL DATA:  Small bowel obstruction.  Ileus. EXAM: ABDOMEN - 2 VIEW COMPARISON:  10/23/2024 FINDINGS: Upright film shows no evidence for intraperitoneal free air. NG tube tip is in the stomach. Proximal side port projects in the region of the GE junction on the upright film but is below the GE junction on the lateral projection. Dilated small bowel loops in the right lower  quadrant are opacified with enteric contrast. There is contrast material visible in the left colon and rectum. IMPRESSION: Dilated small bowel loops in the right lower quadrant are opacified with enteric contrast. There is contrast material visible in the left colon and rectum. Electronically Signed   By: Camellia Candle M.D.   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 Patient presented to the ED on 10/23/2024 with worsening abdominal distension and discomfort. NGT placed for gastric decompression. Request for UGI to evaluate for possible gastric outlet obstruction versus malrotation. EXAM: DG UGI W SINGLE CM TECHNIQUE: Single contrast examination was then performed using 200 mL of Omnipaque  300. This exam was performed by [facility] PA-C, and was supervised and interpreted by Dr. Harrietta Sherry. FLUOROSCOPY: Radiation Exposure Index (as provided by the fluoroscopic device): 22.7 mGy Kerma COMPARISON:  CT ABDOMEN PELVIS W CONTRAST-10/23/2024 FINDINGS: Evaluation is slightly limited due to limited positioning secondary to patient condition. Esophagus:  Nasogastric  tube in place. Gastroesophageal reflux:  None visualized. Stomach: Normal appearance. No hiatal hernia identified. Gastric emptying: Unremarkable. Duodenum: Unremarkable. No definite evidence of malrotation identified on this exam. Other:  Persistent gas-filled loops of bowel are again noted. IMPRESSION: 1. Unremarkable upper GI exam. 2. Persistent gas-filled dilated loops of bowel, suggesting ileus or obstruction. Electronically Signed   By: Harrietta Sherry M.D.   On: 10/24/2024 12:14   DG Abd Portable 1 View Result Date: 10/23/2024 EXAM: 1 VIEW XRAY OF THE ABDOMEN 10/23/2024 03:23:02 PM COMPARISON: 19 days ago. CLINICAL HISTORY: Confirm NG tube  placement. FINDINGS: LINES, TUBES AND DEVICES: Distal tip of the nasogastric tube is seen in proximal stomach. BOWEL: Nonobstructive bowel gas pattern. SOFT TISSUES: No abnormal calcifications. BONES: No acute fracture. IMPRESSION: 1. Distal tip of the nasogastric tube is in the proximal stomach. Electronically signed by: Lynwood Seip MD 10/23/2024 03:42 PM EST RP Workstation: [id number]    Assessment and Plan  Anna Castaneda is a 77 y.o. 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 Patient states symptoms started overnight with worsening abdominal distention.  She also has been constipated for the last few days.  Admitted for possible SBO vs ileus  SBO vs ileus - Presented with abdominal distention, nausea and imaging concern of SBO vs ileus - NPO, IV fluids - NGT for decompression, if improving plan to clamp NGT tomorrow - General Surgery following, appreciate recs.  No need for urgent surgical intervention - 12/12--Removed NG tube. Tolerating CLD. Pt and couple BM's   Hypertension: controlled Lisinopril held   Hyponatremia - Resolved This is likely secondary to decreased oral intake S/p IV fluids   Lipase elevation: Likely secondary to dehydration as pancreas is unremarkable on CT imaging.   Hyperlipidemia/history of TIA: resume meds when po intake ok per surgery   History of seizures: Patient was on Keppra  but this was weaned off by neurology. seizure precautions  Procedures: Family communication :none today Consults :Gen surgery CODE STATUS: FULL DVT Prophylaxis :heparin  Level of care: Med-Surg Status is: Inpatient Remains inpatient appropriate because: SBO    TOTAL TIME TAKING CARE OF THIS PATIENT: 35 minutes.  >50% time spent on counselling and coordination of care  Note:  This dictation was prepared with Dragon dictation along with smaller phrase technology. Any transcriptional errors that result from this process are unintentional.  Leita Blanch M.D    [facility]   CC: Primary care physician; Sadie Manna, MD

## 2024-10-26 LAB — GLUCOSE, CAPILLARY
Glucose-Capillary: 74 mg/dL (ref 70–99)
Glucose-Capillary: 89 mg/dL (ref 70–99)

## 2024-10-26 MED ORDER — POLYETHYLENE GLYCOL 3350 17 G PO PACK
17.0000 g | PACK | Freq: Two times a day (BID) | ORAL | Status: DC
  Filled 2024-10-26: qty 1

## 2024-10-26 NOTE — Progress Notes (Signed)
 Patient ID: Anna Castaneda, female   DOB: 06/19/1946, 78 y.o.   MRN: [id number]     SURGICAL PROGRESS NOTE   Hospital Day(s): 3.   Interval History: Patient seen and examined, no acute events or new complaints overnight. Patient reports feeling great this morning.  She was sitting down in the chair endorses that she is feeling great that her intestines are moving well having a large bowel movement this morning and passing gas.  She denies any nausea or vomiting.  She tolerated full liquid diet last night.  Vital signs in last 24 hours: [min-max] current  Temp:  [96 F (35.6 C)-98 F (36.7 C)] 97.5 F (36.4 C) (12/13 0830) Pulse Rate:  [64-81] 69 (12/13 0830) Resp:  [16-17] 16 (12/13 0830) BP: (112-141)/(55-66) 113/61 (12/13 0830) SpO2:  [95 %-100 %] 100 % (12/13 0830) Weight:  [53.3 kg] 53.3 kg (12/13 0609)     Height: 5' 4 (162.6 cm) Weight: 53.3 kg BMI (Calculated): 20.16   Physical Exam:  Constitutional: alert, cooperative and no distress  Respiratory: breathing non-labored at rest  Cardiovascular: regular rate and sinus rhythm  Gastrointestinal: soft, non-tender, and mild-distended  Labs:     Latest Ref Rng & Units 10/25/2024    5:02 AM 10/24/2024    3:33 AM 10/23/2024   10:57 AM  CBC  WBC 4.0 - 10.5 K/uL 7.6  5.8  9.1   Hemoglobin 12.0 - 15.0 g/dL 88.2  88.4  87.5   Hematocrit 36.0 - 46.0 % 34.8  33.7  36.7   Platelets 150 - 400 K/uL 344  374  407       Latest Ref Rng & Units 10/25/2024    5:02 AM 10/24/2024    3:33 AM 10/23/2024   10:57 AM  CMP  Glucose 70 - 99 mg/dL 52  90  872   BUN 8 - 23 mg/dL 13  9  11    Creatinine 0.44 - 1.00 mg/dL 9.34  9.29  9.16   Sodium 135 - 145 mmol/L 138  135  131   Potassium 3.5 - 5.1 mmol/L 4.1  4.2  4.4   Chloride 98 - 111 mmol/L 99  100  93   CO2 22 - 32 mmol/L 23  26  26    Calcium  8.9 - 10.3 mg/dL 7.6  8.7  9.5   Total Protein 6.5 - 8.1 g/dL   7.2   Total Bilirubin 0.0 - 1.2 mg/dL   0.3   Alkaline Phos 38 - 126 U/L   51   AST  15 - 41 U/L   22   ALT 0 - 44 U/L   14     Imaging studies: No new pertinent imaging studies   Assessment/Plan:  78 y.o. 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    -Continue adequate recovery - She tolerated full liquid diet.  No changes on physical exam.  No nausea or vomiting - Will advance to soft diet - Patient is eager to go home today.  If she tolerates diet for breakfast and lunch and continue passing gas she will be able to be discharged in the afternoon.  Lucas Petrin, MD

## 2024-10-26 NOTE — Plan of Care (Signed)

## 2024-10-26 NOTE — Progress Notes (Signed)
 Discharge instructions were reviewed with patient and questions were encouraged/answered. IV was removed. Belongings collected by pt.

## 2024-10-26 NOTE — Plan of Care (Signed)

## 2024-10-26 NOTE — Discharge Summary (Signed)
 Physician Discharge Summary   Patient: Anna Castaneda MRN: [id number] DOB: 08-Mar-1946  Admit date:     10/23/2024  Discharge date: 10/26/2024  Discharge Physician: Leita Blanch   PCP: Sadie Manna, MD   Recommendations at discharge:    F/u PCP in 1-2 weeks F/u Dr Rodolph in 1-2 weeks or if s/s worsen come to ER  Discharge Diagnoses: Principal Problem:   Small bowel obstruction (HCC) Active Problems:   SBO (small bowel obstruction) (HCC)   Essential hypertension   GERD (gastroesophageal reflux disease)   Seizures (HCC)   History of TIA (transient ischemic attack)  Anna Castaneda is a 78 y.o. 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 Patient states symptoms started overnight with worsening abdominal distention.  She also has been constipated for the last few days.  Admitted for possible SBO vs ileus   SBO vs ileus - Presented with abdominal distention, nausea and imaging concern of SBO vs ileus - NPO, IV fluids - NGT for decompression, if improving plan to clamp NGT tomorrow - General Surgery following, appreciate recs.  No need for urgent surgical intervention - 12/12--Removed NG tube. Tolerating CLD. Pt and couple BM's --12/13--tolerated soft idet. Ambulating in the hallways. Ok to d/c home from Gen surgery   Hypertension: controlled Lisinopril resumed at d/c   Hyponatremia - Resolved This is likely secondary to decreased oral intake S/p IV fluids   Lipase elevation: Likely secondary to dehydration as pancreas is unremarkable on CT imaging.   Hyperlipidemia/history of TIA: resume meds when po intake ok per surgery   History of seizures: Patient was on Keppra  but this was weaned off by neurology. seizure precautions  Improving clinically. D/c home        Pain control - [place]  Controlled Substance Reporting System database was reviewed. and patient was instructed, not to drive, operate heavy machinery, perform activities at heights, swimming or participation in water activities or provide baby-sitting services while on Pain, Sleep and Anxiety Medications; until their outpatient Physician has advised to do so again. Also recommended to not to take more than prescribed Pain, Sleep and Anxiety Medications.  Consultants: Gen surgery Disposition: HOme Diet recommendation:   DISCHARGE MEDICATION: Allergies as of 10/26/2024   No Active Allergies      Medication List     STOP taking these medications    NON FORMULARY       TAKE these medications    ALPHA-D-GALACTOSIDASE PO Take 300 Units by mouth.   aluminum hydroxide-magnesium carbonate 95-358 mg/15 mL Susp Commonly known as: GAVISCON Take by mouth every evening. nightly   aspirin  EC 81 MG tablet Take 81 mg by mouth daily.   atorvastatin  20 MG tablet Commonly known as: LIPITOR Take 20 mg by mouth daily.   Biotin  5 MG Tabs Take 1 tablet (5 mg total) by mouth daily.   bismuth subsalicylate 262 MG/15ML suspension Commonly known as: PEPTO BISMOL Take by mouth.   CALCIUM  LACTATE PO Take by mouth.   cetirizine 10 MG tablet Commonly known as: ZYRTEC Take 10 mg by mouth daily as needed for allergies.   clobetasol cream 0.05 % Commonly known as: TEMOVATE Apply 1 Application topically 2 (two) times daily.   co-enzyme Q-10 50 MG capsule Take 200 mg by mouth daily. Ubidecarenone (Co-Enzyme Q-10, Ubiquinone) 200 tablet   cyanocobalamin 500 MCG  tablet Commonly known as: VITAMIN B12 Take 1,000 mcg by mouth daily.   denosumab 60 MG/ML Sosy injection Commonly known as: PROLIA Inject 60 mg into the skin every 6 (six) months.   FERROUS FUM-IRON POLYSACCH PO Take by mouth. Iron fum, poly #1-vitC-L.casei 130 mg iron-25 mg-30 mg Cap   ketoconazole 2 %  cream Commonly known as: NIZORAL Apply 1 Application topically 2 (two) times daily.   lisinopril 10 MG tablet Commonly known as: ZESTRIL Take 10 mg by mouth daily. What changed: Another medication with the same name was removed. Continue taking this medication, and follow the directions you see here.   MULTIVITAMIN PO Take by mouth. Multivitamin with B complex-vitamin C (FARBEE WITH C) tablet   multivitamin-lutein Caps capsule Take 1 capsule by mouth 2 (two) times daily.   niacinamide 500 MG tablet Take 500 mg by mouth daily.   NON FORMULARY Take by mouth gelatin sponge, absorb/porcine (Gelatine Absorbable MISC)   NON FORMULARY Take by mouth C, E, zinc, copper 11/omega3s/lut (OCUVITE ADULT50+ ORAL)   NON FORMULARY Calm with Calcium    NON FORMULARY CocoaVia   pantoprazole  20 MG tablet Commonly known as: PROTONIX  Take 20 mg by mouth daily.   pimecrolimus 1 % cream Commonly known as: ELIDEL Apply 1 Application topically 2 (two) times daily.   protein supplement Powd Take 2 Scoops by mouth 2 (two) times daily.   saline Gel Place 1 Application into both nostrils every 4 (four) hours as needed.   SIMETHICONE PO Take 1 tablet by mouth every 6 (six) hours as needed (flutulence). GAS-X ORAL)   vitamin C 1000 MG tablet Take 1,000 mg by mouth daily.   Vitamin D3 50 MCG (2000 UT) capsule Take 2,000 Units by mouth daily.        Follow-up Information     Sadie Manna, MD. Schedule an appointment as soon as possible for a visit in 1 week(s).   Specialty: Internal Medicine Contact information: [address] [place] [address] [phone]         Rodolph Romano, MD. Schedule an appointment as soon as possible for a visit in 1 week(s).   Specialty: General Surgery Why: hospital f/u Contact information: [address] [place] [address] [phone]                Discharge Exam: Filed Weights    10/23/24 1054 10/26/24 0609  Weight: 51.3 kg 53.3 kg   GENERAL:  78 y.o.-year-old patient with no acute distress.  LUNGS: Normal breath sounds bilaterally, no wheezing CARDIOVASCULAR: S1, S2 normal. No murmur   ABDOMEN: Soft, nontender, nondistended. Surgical incision Ok EXTREMITIES: No  edema b/l.    NEUROLOGIC: nonfocal  patient is alert and awake  Condition at discharge: fair  The results of significant diagnostics from this hospitalization (including imaging, microbiology, ancillary and laboratory) are listed below for reference.   Imaging Studies: DG Abd 2 Views Result Date: 10/25/2024 CLINICAL DATA:  Small bowel obstruction.  Ileus. EXAM: ABDOMEN - 2 VIEW COMPARISON:  10/23/2024 FINDINGS: Upright film shows no evidence for intraperitoneal free air. NG tube tip is in the stomach. Proximal side port projects in the region of the GE junction on the upright film but is below the GE junction on the lateral projection. Dilated small bowel loops in the right lower quadrant are opacified with enteric contrast. There is contrast material visible in the left colon and rectum. IMPRESSION: Dilated small bowel loops in the right lower quadrant  are opacified with enteric contrast. There is contrast material visible in the left colon and rectum. Electronically Signed   By: Camellia Candle M.D.   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 Patient presented to the ED on 10/23/2024 with worsening abdominal distension and discomfort. NGT placed for gastric decompression. Request for UGI to evaluate for possible gastric outlet obstruction versus malrotation. EXAM: DG UGI W SINGLE CM TECHNIQUE: Single contrast examination was then performed using 200 mL of Omnipaque  300. This exam was performed by [facility] PA-C,  and was supervised and interpreted by Dr. Harrietta Sherry. FLUOROSCOPY: Radiation Exposure Index (as provided by the fluoroscopic device): 22.7 mGy Kerma COMPARISON:  CT ABDOMEN PELVIS W CONTRAST-10/23/2024 FINDINGS: Evaluation is slightly limited due to limited positioning secondary to patient condition. Esophagus:  Nasogastric tube in place. Gastroesophageal reflux:  None visualized. Stomach: Normal appearance. No hiatal hernia identified. Gastric emptying: Unremarkable. Duodenum: Unremarkable. No definite evidence of malrotation identified on this exam. Other:  Persistent gas-filled loops of bowel are again noted. IMPRESSION: 1. Unremarkable upper GI exam. 2. Persistent gas-filled dilated loops of bowel, suggesting ileus or obstruction. Electronically Signed   By: Harrietta Sherry M.D.   On: 10/24/2024 12:14   DG Abd Portable 1 View Result Date: 10/23/2024 EXAM: 1 VIEW XRAY OF THE ABDOMEN 10/23/2024 03:23:02 PM COMPARISON: 19 days ago. CLINICAL HISTORY: Confirm NG tube placement. FINDINGS: LINES, TUBES AND DEVICES: Distal tip of the nasogastric tube is seen in proximal stomach. BOWEL: Nonobstructive bowel gas pattern. SOFT TISSUES: No abnormal calcifications. BONES: No acute fracture. IMPRESSION: 1. Distal tip of the nasogastric tube is in the proximal stomach. Electronically signed by: Lynwood Seip MD 10/23/2024 03:42 PM EST RP Workstation: [id number]   CT ABDOMEN PELVIS W CONTRAST Result Date: 10/23/2024 EXAM: CT ABDOMEN AND PELVIS WITH CONTRAST 10/23/2024 01:00:37 PM TECHNIQUE: CT of the abdomen and pelvis was performed with the administration of 100 mL iohexol  (OMNIPAQUE ) 300 MG/ML solution. Multiplanar reformatted images are provided for review. Automated exposure control, iterative reconstruction, and/or weight-based adjustment of the mA/kV was utilized to reduce the radiation dose to as low as reasonably achievable. COMPARISON: 19 days ago. CLINICAL HISTORY: Bowel obstruction suspected. FINDINGS:  LOWER CHEST: Possible aspirated material seen in left lower lobe bronchi. LIVER: The liver is unremarkable. GALLBLADDER AND BILE DUCTS: Gallbladder is unremarkable. No biliary ductal dilatation. SPLEEN: No acute abnormality. PANCREAS: No acute abnormality. ADRENAL GLANDS: No acute abnormality. KIDNEYS, URETERS AND BLADDER: Left renal atrophy is noted with multiple cysts and scarring. No stones in the kidneys or ureters. No hydronephrosis. No perinephric or periureteral stranding. Urinary bladder is unremarkable. GI AND BOWEL: Moderately dilated esophagus is noted. Moderate gastric distention. Mildly dilated large and small bowel loops are noted suggesting possible ileus or obstruction. Postsurgical changes are seen involving small bowel loops. Stool is seen in multiple large bowel loops. There may be some fecalization seen within small bowel loops suggesting obstruction. Possibility of some degree of malrotation cannot be excluded. PERITONEUM AND RETROPERITONEUM: No ascites. No free air. VASCULATURE: Aorta is normal in caliber. Aortic atherosclerosis. LYMPH NODES: No lymphadenopathy. REPRODUCTIVE ORGANS: No acute abnormality. BONES AND SOFT TISSUES: No acute osseous abnormality. No focal soft tissue abnormality. IMPRESSION: 1. Mildly dilated large and small bowel loops with possible fecalization, suggesting ileus or obstruction. 2. Surgical changes involving  small bowel loops. 3. Possible malrotation. 4. Possible aspirated material seen in left lower lobe bronchi. Electronically signed by: Lynwood Seip MD 10/23/2024 01:42 PM EST RP Workstation: [id number]   CT ABDOMEN PELVIS W CONTRAST Result Date: 10/04/2024 EXAM: CT ABDOMEN AND PELVIS WITH CONTRAST 10/04/2024 02:41:11 PM TECHNIQUE: CT of the abdomen and pelvis was performed with the administration of 80 mL of iohexol  (OMNIPAQUE ) 300 MG/ML solution. Multiplanar reformatted images are provided for review. Automated exposure control, iterative reconstruction,  and/or weight-based adjustment of the mA/kV was utilized to reduce the radiation dose to as low as reasonably achievable. COMPARISON: CT from 09/29/2024, plain film from earlier in the same day. CLINICAL HISTORY: Abdominal pain, post-op. FINDINGS: LOWER CHEST: Lung bases are well aerated with some mild emphysematous changes. LIVER: The liver is unremarkable. GALLBLADDER AND BILE DUCTS: Gallbladder is unremarkable. No biliary ductal dilatation. SPLEEN: No acute abnormality. PANCREAS: No acute abnormality. ADRENAL GLANDS: No acute abnormality. KIDNEYS, URETERS AND BLADDER: Kidneys demonstrate a normal enhancement pattern. Some mild scarring in the left kidney is noted with decreased volume similar to that seen on the prior exam. Stable cysts are noted in the left kidney. No further follow-up is recommended. No stones in the kidneys or ureters. The bladder is well distended. GI AND BOWEL: The stomach is partially decompressed by the gastric catheter. Changes of recent cecectomy are noted. Contrast material is noted throughout the colon. Contrast material passes from the small bowel to the colon without difficulty. Mildly dilated loops of small bowel are noted, which may represent a small bowel ileus, postoperative in nature. PERITONEUM AND RETROPERITONEUM: Free air is noted in the abdomen, although given the recent laparotomy this is felt to be within normal limits. No significant free fluid is noted. VASCULATURE: Aorta demonstrates atherosclerotic calcification without aneurysmal dilatation. LYMPH NODES: No lymphadenopathy. REPRODUCTIVE ORGANS: The uterus and adnexa are within normal limits. BONES AND SOFT TISSUES: No acute osseous abnormality. IMPRESSION: 1. Mildly dilated loops of small bowel, possibly representing a postoperative ileus. No true obstructive changes Electronically signed by: Oneil Devonshire MD 10/04/2024 03:03 PM EST RP Workstation: [id number] Views Result Date: 10/04/2024 EXAM: 2 VIEW  XRAY OF THE ABDOMEN 10/04/2024 08:28:00 AM COMPARISON: 10/02/2024 CLINICAL HISTORY: Ileus following gastrointestinal surgery (HCC) FINDINGS: LINES, TUBES AND DEVICES: Enteric tube stable in gastric body. Midline skin staples noted. BOWEL: Persistent gas-filled dilated loops of bowel consistent with possible ileus. SOFT TISSUES: No opaque urinary calculi. BONES: No acute osseous abnormality. DIAPHRAGMS: Free air under right hemidiaphragm. HEART AND MEDIASTINUM: Stable cardiomegaly. IMPRESSION: 1. Persistent gas-filled dilated loops of bowel consistent with ileus. 2. Persistent signs of pneumoperitoneum in the early postoperative time frame Electronically signed by: Waddell Calk MD 10/04/2024 12:56 PM EST RP Workstation: [id number]   US  EKG SITE RITE Result Date: 10/03/2024 If Site Rite image not attached, placement could not be confirmed due to current cardiac rhythm.  DG Abd 2 Views Result Date: 10/02/2024 EXAM: 2 VIEW XRAY OF THE ABDOMEN 10/02/2024 10:42:02 AM COMPARISON: 10/01/2024 CLINICAL HISTORY: Ileus following gastrointestinal surgery (HCC) FINDINGS: LINES, TUBES AND DEVICES: Enteric tube in place with tip in left mid abdomen. BOWEL: Persistent gaseous dilated loops of bowel loops compatible with ileus. SOFT TISSUES: Skin staples noted. Free air under right hemidiaphragm, likely postsurgical. No opaque urinary calculi. BONES: No acute osseous abnormality. IMPRESSION: 1. Persistent gaseous dilated bowel loops compatible with ileus. 2. Free air under the right hemidiaphragm, likely postoperative. Electronically signed by: Waddell Calk MD 10/02/2024 01:50 PM EST  RP Workstation: [id number]   DG Abd Portable 1V-Small Bowel Obstruction Protocol-initial, 8 hr delay Result Date: 10/01/2024 CLINICAL DATA:  Small-bowel obstruction EXAM: PORTABLE ABDOMEN - 1 VIEW COMPARISON:  Abdominal x-ray 09/29/2024, CT abdomen and pelvis 09/29/2024. FINDINGS: Enteric tube tip is in the region of the presumed gastric  body. Gaseous distention of the stomach has decreased. There are new midline surgical clips present. There are multiple dilated loops of small bowel measuring up to 4 cm containing contrast within the lower abdomen. No significant colonic gas identified. Free air is difficult to exclude on a supine view. IMPRESSION: 1. Multiple dilated loops of small bowel measuring up to 4 cm containing contrast within the lower abdomen. No significant colonic gas identified. Findings are concerning for small bowel obstruction. Postoperative ileus is also in the differential. 2. Enteric tube tip is in the region of the presumed gastric body. Electronically Signed   By: Greig Pique M.D.   On: 10/01/2024 23:30   DG Abd Portable 1 View Result Date: 09/29/2024 EXAM: 1 VIEW XRAY OF THE ABDOMEN 09/29/2024 10:37:00 AM COMPARISON: Study performed earlier today. CLINICAL HISTORY: Verify placement of NG tube. FINDINGS: LIMITATIONS/ARTIFACTS: The patient appears rotated. LINES, TUBES AND DEVICES: Enteric tube is identified with tip just below the level of the hemidiaphragms and side port above the level of the GE junction. BOWEL: Persistent gaseous distention of the gastric lumen. Dilated loops of bowel within the imaged portions of the abdomen is again noted and appears unchanged from study performed earlier today. SOFT TISSUES: No opaque urinary calculi. BONES: No acute osseous abnormality. IMPRESSION: 1. Enteric tube with tip just below the hemidiaphragms and side port above the GE junction, recommend advancement so the side port lies within the stomach or distal to the GE junction. 2. Persistent gaseous distention of the stomach. 3. Dilated bowel loops within the imaged abdomen, unchanged from earlier today. Electronically signed by: Waddell Calk MD 09/29/2024 11:09 AM EST RP Workstation: [id number]   DG Abd Portable 1 View Result Date: 09/29/2024 CLINICAL DATA:  NG tube placement. EXAM: PORTABLE ABDOMEN - 1 VIEW COMPARISON:   11/03/2023, CT today. FINDINGS: Elevation of the left hemidiaphragm with moderate gastric distention unchanged. Nasogastric tube demonstrates a curvilinear course over the mediastinum right of midline with tip over the level of the medial right lung base. The NG tube tip may be within patient's known hiatal hernia, although endobronchial placement is possible. Mild cardiomegaly. Lung bases are clear. Multiple air-filled bowel loops incompletely evaluated but without significant change compared to recent CT. IMPRESSION: 1. Nasogastric tube with a curvilinear course over the mediastinum right of midline with tip over the medial right lung base. The NG tube tip may be within patient's known hiatal hernia, although endobronchial placement is possible. Recommend repositioning. 2. Stable elevation of the left hemidiaphragm with moderate gastric distention. These results were called by telephone at the time of interpretation on 09/29/2024 at 9:46 am to provider [facility] , who verbally acknowledged these results. Electronically Signed   By: Toribio Agreste M.D.   On: 09/29/2024 09:49   CT ABDOMEN PELVIS W CONTRAST Result Date: 09/29/2024 EXAM: CT ABDOMEN AND PELVIS WITH CONTRAST 09/29/2024 07:57:24 AM TECHNIQUE: CT of the abdomen and pelvis was performed with the administration of 100 mL of iohexol  (OMNIPAQUE ) 300 MG/ML solution. Multiplanar reformatted images are provided for review. Automated exposure control, iterative reconstruction, and/or weight-based adjustment of the mA/kV was utilized to reduce the radiation dose to as low as reasonably achievable. COMPARISON: None  available. CLINICAL HISTORY: Bowel obstruction suspected. FINDINGS: LOWER CHEST: No acute abnormality. LIVER: The liver is unremarkable. GALLBLADDER AND BILE DUCTS: The gallbladder is either contracted or absent. No biliary ductal dilatation. SPLEEN: The spleen is diminutive. PANCREAS: No acute abnormality. ADRENAL GLANDS: No acute abnormality.  KIDNEYS, URETERS AND BLADDER: There are small simple cysts arising from the superior pole of the left kidney, which is atrophic. Per consensus, no follow-up is needed for simple Bosniak type 1 and 2 renal cysts, unless the patient has a malignancy history or risk factors. No stones in the kidneys or ureters. No hydronephrosis. No perinephric or periureteral stranding. Urinary bladder is unremarkable. GI AND BOWEL: The stomach is markedly distended with fluid and air, measuring up to 13 cm in diameter. The cecum is also abnormally distended, measuring up to 9 cm in diameter. There is formed fecal material present in what appears to be the distal small bowel, suggestive of obstruction. PERITONEUM AND RETROPERITONEUM: No ascites. No free air. VASCULATURE: The abdominal aorta is normal in caliber and demonstrates mild-to-moderate calcific atheromatous disease. LYMPH NODES: No lymphadenopathy. REPRODUCTIVE ORGANS: No acute abnormality. BONES AND SOFT TISSUES: There is mild levoscoliosis of the thoracolumbar spine. No acute osseous abnormality. No focal soft tissue abnormality. IMPRESSION: 1. Findings concerning for bowel obstruction with markedly distended stomach (up to 13 cm) and cecum (up to 9 cm) and fecalized distal small bowel contents; recommend urgent clinical and surgical evaluation. This is concerning for an atypical appearance of cecal volvulus. Surgery consultation is recommended. 2. Possible gastric outlet obstruction. 3. Small simple-appearing renal cysts in an atrophic left kidney; benign Bosniak I/II cysts with no follow-up imaging recommended Electronically signed by: Evalene Coho MD 09/29/2024 08:16 AM EST RP Workstation: [id number]    Microbiology: Results for orders placed or performed during the hospital encounter of 08/29/19  SARS CORONAVIRUS 2 (TAT 6-24 HRS) Nasopharyngeal Nasopharyngeal Swab     Status: None   Collection Time: 08/29/19 10:44 AM   Specimen: Nasopharyngeal Swab  Result  Value Ref Range Status   SARS Coronavirus 2 NEGATIVE NEGATIVE Final    Comment: (NOTE) SARS-CoV-2 target nucleic acids are NOT DETECTED. The SARS-CoV-2 RNA is generally detectable in upper and lower respiratory specimens during the acute phase of infection. Negative results do not preclude SARS-CoV-2 infection, do not rule out co-infections with other pathogens, and should not be used as the sole basis for treatment or other patient management decisions. Negative results must be combined with clinical observations, patient history, and epidemiological information. The expected result is Negative. Fact Sheet for Patients: hairslick.no Fact Sheet for Healthcare Providers: quierodirigir.com This test is not yet approved or cleared by the [place]  FDA and  has been authorized for detection and/or diagnosis of SARS-CoV-2 by FDA under an Emergency Use Authorization (EUA). This EUA will remain  in effect (meaning this test can be used) for the duration of the COVID-19 declaration under Section 56 4(b)(1) of the Act, 21 U.S.C. section 360bbb-3(b)(1), unless the authorization is terminated or revoked sooner. Performed at [facility], [address], [place], [place] [address]     Labs: CBC: Recent Labs  Lab 10/23/24 1057 10/24/24 0333 10/25/24 0502  WBC 9.1 5.8 7.6  HGB 12.4 11.5* 11.7*  HCT 36.7 33.7* 34.8*  MCV 93.1 92.6 93.3  PLT 407* 374 344   Basic Metabolic Panel: Recent Labs  Lab 10/23/24 1057 10/24/24 0333 10/25/24 0502  NA 131* 135 138  K 4.4 4.2 4.1  CL 93* 100  99  CO2 26 26 23   GLUCOSE 127* 90 52*  BUN 11 9 13   CREATININE 0.83 0.70 0.65  CALCIUM  9.5 8.7* 7.6*  MG 1.9  --  2.1   Liver Function Tests: Recent Labs  Lab 10/23/24 1057  AST 22  ALT 14  ALKPHOS 51  BILITOT 0.3  PROT 7.2  ALBUMIN 4.3   CBG: Recent Labs  Lab 10/25/24 0753 10/25/24 1146 10/25/24 1746 10/26/24 0058  10/26/24 0829  GLUCAP 173* 136* 82 89 74    Discharge time spent: greater than 30 minutes.  Signed: Leita Blanch, MD [facility] 10/26/2024

## 2024-11-09 ENCOUNTER — Emergency Department

## 2024-11-09 ENCOUNTER — Other Ambulatory Visit: Payer: Self-pay

## 2024-11-09 ENCOUNTER — Inpatient Hospital Stay
Admission: EM | Admit: 2024-11-09 | Discharge: 2024-12-15 | DRG: 329 | Disposition: E | Attending: Pulmonary Disease | Admitting: Pulmonary Disease

## 2024-11-09 DIAGNOSIS — K55019 Acute (reversible) ischemia of small intestine, extent unspecified: Secondary | ICD-10-CM | POA: Diagnosis present

## 2024-11-09 DIAGNOSIS — I10 Essential (primary) hypertension: Secondary | ICD-10-CM | POA: Diagnosis present

## 2024-11-09 DIAGNOSIS — Z87891 Personal history of nicotine dependence: Secondary | ICD-10-CM

## 2024-11-09 DIAGNOSIS — E875 Hyperkalemia: Secondary | ICD-10-CM | POA: Diagnosis present

## 2024-11-09 DIAGNOSIS — E871 Hypo-osmolality and hyponatremia: Secondary | ICD-10-CM | POA: Diagnosis present

## 2024-11-09 DIAGNOSIS — I96 Gangrene, not elsewhere classified: Secondary | ICD-10-CM | POA: Diagnosis present

## 2024-11-09 DIAGNOSIS — I351 Nonrheumatic aortic (valve) insufficiency: Secondary | ICD-10-CM | POA: Diagnosis present

## 2024-11-09 DIAGNOSIS — K219 Gastro-esophageal reflux disease without esophagitis: Secondary | ICD-10-CM | POA: Diagnosis present

## 2024-11-09 DIAGNOSIS — K7200 Acute and subacute hepatic failure without coma: Secondary | ICD-10-CM | POA: Diagnosis not present

## 2024-11-09 DIAGNOSIS — D75839 Thrombocytosis, unspecified: Secondary | ICD-10-CM | POA: Diagnosis present

## 2024-11-09 DIAGNOSIS — R636 Underweight: Secondary | ICD-10-CM | POA: Diagnosis present

## 2024-11-09 DIAGNOSIS — E861 Hypovolemia: Secondary | ICD-10-CM | POA: Diagnosis present

## 2024-11-09 DIAGNOSIS — Z515 Encounter for palliative care: Secondary | ICD-10-CM

## 2024-11-09 DIAGNOSIS — M810 Age-related osteoporosis without current pathological fracture: Secondary | ICD-10-CM | POA: Diagnosis present

## 2024-11-09 DIAGNOSIS — G9341 Metabolic encephalopathy: Secondary | ICD-10-CM | POA: Diagnosis not present

## 2024-11-09 DIAGNOSIS — K5652 Intestinal adhesions [bands] with complete obstruction: Principal | ICD-10-CM | POA: Diagnosis present

## 2024-11-09 DIAGNOSIS — Z66 Do not resuscitate: Secondary | ICD-10-CM | POA: Diagnosis present

## 2024-11-09 DIAGNOSIS — I2699 Other pulmonary embolism without acute cor pulmonale: Secondary | ICD-10-CM | POA: Diagnosis not present

## 2024-11-09 DIAGNOSIS — R188 Other ascites: Secondary | ICD-10-CM | POA: Diagnosis present

## 2024-11-09 DIAGNOSIS — I252 Old myocardial infarction: Secondary | ICD-10-CM

## 2024-11-09 DIAGNOSIS — R739 Hyperglycemia, unspecified: Secondary | ICD-10-CM | POA: Diagnosis present

## 2024-11-09 DIAGNOSIS — K56609 Unspecified intestinal obstruction, unspecified as to partial versus complete obstruction: Secondary | ICD-10-CM | POA: Diagnosis not present

## 2024-11-09 DIAGNOSIS — E785 Hyperlipidemia, unspecified: Secondary | ICD-10-CM | POA: Diagnosis present

## 2024-11-09 DIAGNOSIS — E8809 Other disorders of plasma-protein metabolism, not elsewhere classified: Secondary | ICD-10-CM | POA: Diagnosis present

## 2024-11-09 DIAGNOSIS — Z7901 Long term (current) use of anticoagulants: Secondary | ICD-10-CM

## 2024-11-09 DIAGNOSIS — R748 Abnormal levels of other serum enzymes: Secondary | ICD-10-CM | POA: Diagnosis present

## 2024-11-09 DIAGNOSIS — J9601 Acute respiratory failure with hypoxia: Secondary | ICD-10-CM | POA: Diagnosis not present

## 2024-11-09 DIAGNOSIS — Z681 Body mass index (BMI) 19 or less, adult: Secondary | ICD-10-CM

## 2024-11-09 DIAGNOSIS — D62 Acute posthemorrhagic anemia: Secondary | ICD-10-CM | POA: Diagnosis not present

## 2024-11-09 DIAGNOSIS — K56601 Complete intestinal obstruction, unspecified as to cause: Principal | ICD-10-CM

## 2024-11-09 DIAGNOSIS — R6521 Severe sepsis with septic shock: Secondary | ICD-10-CM | POA: Diagnosis not present

## 2024-11-09 DIAGNOSIS — K567 Ileus, unspecified: Secondary | ICD-10-CM | POA: Diagnosis not present

## 2024-11-09 DIAGNOSIS — E872 Acidosis, unspecified: Secondary | ICD-10-CM | POA: Diagnosis present

## 2024-11-09 DIAGNOSIS — Z79899 Other long term (current) drug therapy: Secondary | ICD-10-CM

## 2024-11-09 DIAGNOSIS — E860 Dehydration: Secondary | ICD-10-CM | POA: Diagnosis present

## 2024-11-09 DIAGNOSIS — Z8673 Personal history of transient ischemic attack (TIA), and cerebral infarction without residual deficits: Secondary | ICD-10-CM

## 2024-11-09 DIAGNOSIS — K562 Volvulus: Secondary | ICD-10-CM | POA: Diagnosis not present

## 2024-11-09 DIAGNOSIS — N170 Acute kidney failure with tubular necrosis: Secondary | ICD-10-CM | POA: Diagnosis not present

## 2024-11-09 DIAGNOSIS — A419 Sepsis, unspecified organism: Secondary | ICD-10-CM | POA: Diagnosis not present

## 2024-11-09 DIAGNOSIS — D696 Thrombocytopenia, unspecified: Secondary | ICD-10-CM | POA: Diagnosis present

## 2024-11-09 LAB — COMPREHENSIVE METABOLIC PANEL WITH GFR
ALT: 15 U/L (ref 0–44)
AST: 24 U/L (ref 15–41)
Albumin: 4.5 g/dL (ref 3.5–5.0)
Alkaline Phosphatase: 46 U/L (ref 38–126)
Anion gap: 14 (ref 5–15)
BUN: 14 mg/dL (ref 8–23)
CO2: 22 mmol/L (ref 22–32)
Calcium: 9.3 mg/dL (ref 8.9–10.3)
Chloride: 94 mmol/L — ABNORMAL LOW (ref 98–111)
Creatinine, Ser: 0.86 mg/dL (ref 0.44–1.00)
GFR, Estimated: >60 mL/min
Glucose, Bld: 166 mg/dL — ABNORMAL HIGH (ref 70–99)
Potassium: 4.2 mmol/L (ref 3.5–5.1)
Sodium: 129 mmol/L — ABNORMAL LOW (ref 135–145)
Total Bilirubin: 0.4 mg/dL (ref 0.0–1.2)
Total Protein: 7.2 g/dL (ref 6.5–8.1)

## 2024-11-09 LAB — CBC
HCT: 40.3 % (ref 36.0–46.0)
Hemoglobin: 13.3 g/dL (ref 12.0–15.0)
MCH: 31.5 pg (ref 26.0–34.0)
MCHC: 33 g/dL (ref 30.0–36.0)
MCV: 95.5 fL (ref 80.0–100.0)
Platelets: 447 K/uL — ABNORMAL HIGH (ref 150–400)
RBC: 4.22 MIL/uL (ref 3.87–5.11)
RDW: 16 % — ABNORMAL HIGH (ref 11.5–15.5)
WBC: 6.1 K/uL (ref 4.0–10.5)
nRBC: 0 % (ref 0.0–0.2)

## 2024-11-09 LAB — LIPASE, BLOOD: Lipase: 110 U/L — ABNORMAL HIGH (ref 11–51)

## 2024-11-09 LAB — LACTIC ACID, PLASMA
Lactic Acid, Venous: 2.1 mmol/L (ref 0.5–1.9)
Lactic Acid, Venous: 0.9 mmol/L (ref 0.5–1.9)

## 2024-11-09 MED ORDER — ONDANSETRON HCL 4 MG/2ML IJ SOLN
4.0000 mg | Freq: Four times a day (QID) | INTRAMUSCULAR | Status: DC | PRN
  Administered 2024-11-15: 4 mg via INTRAVENOUS

## 2024-11-09 MED ORDER — PANTOPRAZOLE SODIUM 40 MG IV SOLR
40.0000 mg | INTRAVENOUS | Status: DC
  Administered 2024-11-09 – 2024-11-17 (×9): 40 mg via INTRAVENOUS
  Filled 2024-11-09 (×3): qty 10

## 2024-11-09 MED ORDER — ONDANSETRON HCL 4 MG PO TABS: 4.0000 mg | ORAL_TABLET | Freq: Four times a day (QID) | ORAL | Status: DC | PRN

## 2024-11-09 MED ORDER — ENOXAPARIN SODIUM 40 MG/0.4ML IJ SOSY
40.0000 mg | PREFILLED_SYRINGE | INTRAMUSCULAR | Status: DC
  Administered 2024-11-09 – 2024-11-16 (×5): 40 mg via SUBCUTANEOUS
  Filled 2024-11-09 (×3): qty 0.4

## 2024-11-09 MED ORDER — LACTATED RINGERS IV BOLUS
1000.0000 mL | Freq: Once | INTRAVENOUS | Status: AC
  Administered 2024-11-09: 1000 mL via INTRAVENOUS

## 2024-11-09 MED ORDER — SODIUM CHLORIDE 0.9 % IV SOLN: INTRAVENOUS | Status: AC

## 2024-11-09 MED ORDER — ONDANSETRON HCL 4 MG/2ML IJ SOLN
4.0000 mg | INTRAMUSCULAR | Status: AC
  Administered 2024-11-09: 4 mg via INTRAVENOUS
  Filled 2024-11-09: qty 2

## 2024-11-09 MED ORDER — ACETAMINOPHEN 650 MG RE SUPP: 650.0000 mg | Freq: Four times a day (QID) | RECTAL | Status: DC | PRN

## 2024-11-09 MED ORDER — IOHEXOL 300 MG/ML  SOLN
100.0000 mL | Freq: Once | INTRAMUSCULAR | Status: AC | PRN
  Administered 2024-11-09: 100 mL via INTRAVENOUS

## 2024-11-09 MED ORDER — MORPHINE SULFATE (PF) 4 MG/ML IV SOLN
4.0000 mg | Freq: Once | INTRAVENOUS | Status: AC
  Administered 2024-11-09: 4 mg via INTRAVENOUS
  Filled 2024-11-09: qty 1

## 2024-11-09 MED ORDER — HYDROMORPHONE HCL 1 MG/ML IJ SOLN
0.5000 mg | INTRAMUSCULAR | Status: DC | PRN
  Administered 2024-11-09 – 2024-11-15 (×9): 1 mg via INTRAVENOUS
  Filled 2024-11-09 (×5): qty 1

## 2024-11-09 MED ORDER — HYDROMORPHONE HCL 1 MG/ML IJ SOLN: 0.5000 mg | INTRAMUSCULAR | Status: DC | PRN

## 2024-11-09 MED ORDER — ACETAMINOPHEN 325 MG PO TABS: 650.0000 mg | ORAL_TABLET | Freq: Four times a day (QID) | ORAL | Status: DC | PRN

## 2024-11-09 MED ORDER — KETOROLAC TROMETHAMINE 15 MG/ML IJ SOLN
15.0000 mg | Freq: Four times a day (QID) | INTRAMUSCULAR | Status: AC | PRN
  Administered 2024-11-09 – 2024-11-13 (×9): 15 mg via INTRAVENOUS
  Filled 2024-11-09 (×4): qty 1

## 2024-11-09 MED ORDER — HYDRALAZINE HCL 20 MG/ML IJ SOLN
5.0000 mg | Freq: Four times a day (QID) | INTRAMUSCULAR | Status: DC | PRN
  Administered 2024-11-09: 5 mg via INTRAVENOUS
  Filled 2024-11-09: qty 1

## 2024-11-09 NOTE — Consult Note (Signed)
 Patient ID: Anna Castaneda, female   DOB: 01/04/1946, 78 y.o.   MRN: [id number] CC: Abdominal Distention History of Present Illness Anna Castaneda is a 78 y.o. female with past medical history as below who presents in consultation for abdominal distention.  In summary the patient has had an eventful last 2 months.  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  At that time she had a right hemicolectomy with primary anastomosis.  She recovered well from this and was discharged home a few days later.  On 10 December she presented back to the hospital with abdominal distention and a CT scan was concerning for small bowel obstruction.  She had NG tube decompression at that time and started to have return of bowel function and was able to tolerate a diet and was discharged.  She said that she was doing well up until yesterday afternoon.  She said she had a last bowel movement yesterday morning and it was normal.  However starting after this she developed abdominal distention.  She says at home she had no episodes of vomiting but was gagging.  The distention worsened so she came to the emergency department for evaluation.  She reports that she has abdominal pain secondary to the pressure..  Past Medical History Past Medical History:  Diagnosis Date   Arthritis 2018   GERD (gastroesophageal reflux disease) 2005   Hyperlipidemia    Hypertension    Myocardial infarction (HCC) 02/01/2016   Broken Heart Syndrome   Osteoporosis 2018       Past Surgical History:  Procedure Laterality Date   APPLICATION OF WOUND VAC  09/29/2024   Procedure: APPLICATION, WOUND VAC;  Surgeon: Rodolph Romano, MD;  Location: [facility] ORS;  Service: General;;   CATARACT EXTRACTION Bilateral 2004   COLONOSCOPY WITH PROPOFOL  N/A 09/02/2019   Procedure: COLONOSCOPY WITH PROPOFOL ;  Surgeon: Toledo, Ladell POUR, MD;  Location: [facility];   Service: Gastroenterology;  Laterality: N/A;   LAPAROTOMY N/A 09/29/2024   Procedure: LAPAROTOMY, EXPLORATORY with COLECTOMY;  Surgeon: Rodolph Romano, MD;  Location: [facility] ORS;  Service: General;  Laterality: N/A;   TONSILLECTOMY Bilateral 1954   TUBAL LIGATION  1980    Allergies[1]  No current facility-administered medications for this encounter.   Current Outpatient Medications  Medication Sig Dispense Refill   ALPHA-D-GALACTOSIDASE PO Take 300 Units by mouth.     aluminum hydroxide-magnesium carbonate (GAVISCON) 95-358 mg/15 mL SUSP Take by mouth every evening. nightly     Ascorbic Acid (VITAMIN C) 1000 MG tablet Take 1,000 mg by mouth daily.     aspirin  EC 81 MG tablet Take 81 mg by mouth daily.     atorvastatin  (LIPITOR) 20 MG tablet Take 20 mg by mouth daily.     Biotin  5 MG TABS Take 1 tablet (5 mg total) by mouth daily.     bismuth subsalicylate (PEPTO BISMOL) 262 MG/15ML suspension Take by mouth.     CALCIUM  LACTATE PO Take by mouth.     cetirizine (ZYRTEC) 10 MG tablet Take 10 mg by mouth daily as needed for allergies.      Cholecalciferol (VITAMIN D3) 50 MCG (2000 UT) capsule Take 2,000 Units by mouth daily.     clobetasol cream (TEMOVATE) 0.05 % Apply 1 Application topically 2 (two) times daily.     co-enzyme Q-10 50 MG capsule Take 200 mg by mouth daily. Ubidecarenone (Co-Enzyme Q-10, Ubiquinone) 200 tablet  cyanocobalamin (VITAMIN B12) 500 MCG tablet Take 1,000 mcg by mouth daily.     denosumab (PROLIA) 60 MG/ML SOSY injection Inject 60 mg into the skin every 6 (six) months.     FERROUS FUM-IRON POLYSACCH PO Take by mouth. Iron fum, poly #1-vitC-L.casei 130 mg iron-25 mg-30 mg Cap     ketoconazole (NIZORAL) 2 % cream Apply 1 Application topically 2 (two) times daily.     lisinopril (ZESTRIL) 10 MG tablet Take 10 mg by mouth daily.     Multiple Vitamin (MULTIVITAMIN PO) Take by mouth. Multivitamin with B complex-vitamin C (FARBEE WITH C) tablet      multivitamin-lutein (OCUVITE-LUTEIN) CAPS capsule Take 1 capsule by mouth 2 (two) times daily.     niacinamide 500 MG tablet Take 500 mg by mouth daily.     NON FORMULARY Take by mouth gelatin sponge, absorb/porcine (Gelatine Absorbable MISC)     NON FORMULARY Take by mouth C, E, zinc, copper 11/omega3s/lut (OCUVITE ADULT50+ ORAL)     NON FORMULARY Calm with Calcium      NON FORMULARY CocoaVia     pantoprazole  (PROTONIX ) 20 MG tablet Take 20 mg by mouth daily.     pimecrolimus (ELIDEL) 1 % cream Apply 1 Application topically 2 (two) times daily.     protein supplement (RESOURCE BENEPROTEIN) POWD Take 2 Scoops by mouth 2 (two) times daily.     saline (AYR) GEL Place 1 Application into both nostrils every 4 (four) hours as needed. 14.1 g 0   SIMETHICONE PO Take 1 tablet by mouth every 6 (six) hours as needed (flutulence). GAS-X ORAL)       Family History Family History  Problem Relation Age of Onset   Breast cancer Neg Hx        Social History Social History[2]      ROS Full ROS of systems performed and is otherwise negative there than what is stated in the HPI  Physical Exam Blood pressure (!) 157/80, pulse 75, temperature (!) 97.5 F (36.4 C), temperature source Oral, height 5' 4 (1.626 m), weight 53.3 kg, SpO2 100%.  Alert and oriented x 3, normal work of breathing on room air, regular rate and rhythm, abdomen is markedly distended but is soft, she does have tenderness throughout but there is no guarding or rebound tenderness.  Her midline surgical wound has healed well without any erythema.  She has an NG tube in place which I troubleshoot out.  She has had approximately 500 cc out of gastric appearing liquid.  KUB performed during my exam and I did appear that the NG tube was in the stomach.  Data Reviewed CT scan reviewed.  It is difficult to traced the bowel but it does look like there is massively dilated stomach and small bowel.  In the pelvis I do see a little bit of free  fluid as well as some decompressed small bowel.  When comparing this to her previous CT scan done in the middle of December it does look similar with dilated loops of small bowel.  I can see where the anastomosis is in there does not look like there is any dilation at the anastomosis.  I do not appreciate any free air or pneumatosis.  I have personally reviewed the patient's imaging and medical records.    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  She is hemodynamically within normal limits and does not have peritonitis.  At this time we will continue NG tube decompression.  I did troubleshoot the NG tube to ensure that it was working.  During my exam I was able to make sure that it was working and got out about 500 cc of gastric appearing fluid.  Continue on low continuous wall suction.  Continue fluid resuscitation given lactic acidosis.  I will discuss case with Dr. Cesar this morning as he is quite familiar with this patient.  A total of 65 minutes was spent reviewing the patient's chart, performing history and physical and discussing treatment options with the patient   Jayson MALVA Endow 11/09/2024, 6:45 AM     [1] No Known Allergies [2]  Social History Tobacco Use   Smoking status: Former    Current packs/day: 0.00    Average packs/day: 1 pack/day for 15.0 years (15.0 ttl pk-yrs)    Types: Cigarettes    Start date: 04/23/1974    Quit date: 04/23/1989    Years since quitting: 35.5   Smokeless tobacco: Never   Tobacco comments:    smoker: 07/03/1974 - 04/23/1989  Vaping Use   Vaping status: Never Used  Substance Use Topics   Alcohol use: Yes    Alcohol/week: 7.0 standard drinks of alcohol    Types: 7 Glasses of wine per week   Drug use: Never

## 2024-11-09 NOTE — H&P (Signed)
 " History and Physical    Anna Castaneda FMW:[id number] DOB: 1946/04/07 DOA: 11/09/2024  PCP: Sadie Manna, MD (Confirm with patient/family/NH records and if not entered, this has to be entered at [facility] point of entry) Patient coming from: Home  I have personally briefly reviewed patient's old medical records in [facility]  Chief Complaint: Belly distended and hard  HPI: Anna Castaneda is a 78 y.o. female with medical history significant of recent thickened bolus status post right Hemi colectomy 6 weeks ago with recurrent SBO x 2 afterwards, HTN, HLD, TIA, presented with recurrent abdominal pain and distention.  Symptoms started last night, patient started to feel cramping like abdominal pain and distention.  She reported that she had a normal bowel movement yesterday morning.  Cramping-like pain became constant this morning and patient decided to come to the hospital.  Patient denied any chest pain shortness of breath, no fever or chills.  ED Course: Temperature 97.5 nontachycardic blood pressure 138/77 O2/99% on room air.  CT abdomen pelvis showed high-grade SBO.  NGT placed, patient was given multiple rounds of morphine  and Zofran .  Review of Systems: As per HPI otherwise 14 point review of systems negative.    Past Medical History:  Diagnosis Date   Arthritis 2018   GERD (gastroesophageal reflux disease) 2005   Hyperlipidemia    Hypertension    Myocardial infarction (HCC) 02/01/2016   Broken Heart Syndrome   Osteoporosis 2018    Past Surgical History:  Procedure Laterality Date   APPLICATION OF WOUND VAC  09/29/2024   Procedure: APPLICATION, WOUND VAC;  Surgeon: Rodolph Romano, MD;  Location: [facility] ORS;  Service: General;;   CATARACT EXTRACTION Bilateral 2004   COLONOSCOPY WITH PROPOFOL  N/A 09/02/2019   Procedure: COLONOSCOPY WITH PROPOFOL ;  Surgeon: Toledo, Ladell POUR, MD;  Location: [facility] END[facility];  Service: Gastroenterology;  Laterality: N/A;   LAPAROTOMY N/A  09/29/2024   Procedure: LAPAROTOMY, EXPLORATORY with COLECTOMY;  Surgeon: Rodolph Romano, MD;  Location: [facility] ORS;  Service: General;  Laterality: N/A;   TONSILLECTOMY Bilateral 1954   TUBAL LIGATION  1980     reports that she quit smoking about 35 years ago. Her smoking use included cigarettes. She started smoking about 50 years ago. She has a 15 pack-year smoking history. She has never used smokeless tobacco. She reports current alcohol use of about 7.0 standard drinks of alcohol per week. She reports that she does not use drugs.  Allergies[1]  Family History  Problem Relation Age of Onset   Breast cancer Neg Hx      Prior to Admission medications  Medication Sig Start Date End Date Taking? Authorizing Provider  aluminum hydroxide-magnesium carbonate (GAVISCON) 95-358 mg/15 mL SUSP Take by mouth every evening. nightly   Yes [provider]  Ascorbic Acid (VITAMIN C) 1000 MG tablet Take 1,000 mg by mouth daily.   Yes [provider]  atorvastatin  (LIPITOR) 20 MG tablet Take 20 mg by mouth daily.   Yes [provider]  CALCIUM  LACTATE PO Take by mouth.   Yes [provider]  cetirizine (ZYRTEC) 10 MG tablet Take 10 mg by mouth daily as needed for allergies.    Yes [provider]  Cholecalciferol (VITAMIN D3) 50 MCG (2000 UT) capsule Take 2,000 Units by mouth daily.   Yes [provider]  clobetasol cream (TEMOVATE) 0.05 % Apply 1 Application topically 2 (two) times daily. 09/23/24  Yes [provider]  co-enzyme Q-10 50 MG capsule Take 200 mg by mouth  daily. Ubidecarenone (Co-Enzyme Q-10, Ubiquinone) 200 tablet   Yes [provider]  cyanocobalamin (VITAMIN B12) 500 MCG tablet Take 1,000 mcg by mouth daily.   Yes [provider]  denosumab (PROLIA) 60 MG/ML SOSY injection Inject 60 mg into the skin every 6 (six) months.   Yes [provider]  doxycycline (VIBRA-TABS) 100 MG tablet Take 100 mg  by mouth daily. 10/28/24  Yes [provider]  FERROUS FUM-IRON POLYSACCH PO Take by mouth. Iron fum, poly #1-vitC-L.casei 130 mg iron-25 mg-30 mg Cap   Yes [provider]  ketoconazole (NIZORAL) 2 % cream Apply 1 Application topically 2 (two) times daily. 10/05/23  Yes [provider]  lisinopril (ZESTRIL) 10 MG tablet Take 10 mg by mouth daily. 10/23/23  Yes [provider]  multivitamin-lutein (OCUVITE-LUTEIN) CAPS capsule Take 1 capsule by mouth 2 (two) times daily.   Yes [provider]  niacinamide 500 MG tablet Take 500 mg by mouth daily.   Yes [provider]  NON FORMULARY Take by mouth gelatin sponge, absorb/porcine (Gelatine Absorbable MISC)   Yes [provider]  NON FORMULARY Take by mouth C, E, zinc, copper 11/omega3s/lut (OCUVITE ADULT50+ ORAL)   Yes [provider]  NON FORMULARY Calm with Calcium    Yes [provider]  NON FORMULARY CocoaVia   Yes [provider]  pantoprazole  (PROTONIX ) 20 MG tablet Take 20 mg by mouth daily.   Yes [provider]  pimecrolimus (ELIDEL) 1 % cream Apply 1 Application topically 2 (two) times daily.   Yes [provider]    Physical Exam: Vitals:   11/09/24 0500 11/09/24 0530 11/09/24 0630 11/09/24 0711  BP: (!) 175/83 (!) 157/80 138/77   Pulse: 84 75 79   Resp:   (!) 22   Temp:      TempSrc:      SpO2: 100% 100% 99% 99%  Weight:      Height:        Constitutional: NAD, calm, comfortable Vitals:   11/09/24 0500 11/09/24 0530 11/09/24 0630 11/09/24 0711  BP: (!) 175/83 (!) 157/80 138/77   Pulse: 84 75 79   Resp:   (!) 22   Temp:      TempSrc:      SpO2: 100% 100% 99% 99%  Weight:      Height:       Eyes: PERRL, lids and conjunctivae normal ENMT: Mucous membranes are moist. Posterior pharynx clear of any exudate or lesions.Normal dentition.  Neck: normal, supple, no masses, no thyromegaly Respiratory: clear to auscultation  bilaterally, no wheezing, no crackles. Normal respiratory effort. No accessory muscle use.  Cardiovascular: Regular rate and rhythm, no murmurs / rubs / gallops. No extremity edema. 2+ pedal pulses. No carotid bruits.  Abdomen: Distended, tenderness around the periumbilical area, hyperactive bowel sounds, no masses palpated. No hepatosplenomegaly.  Musculoskeletal: no clubbing / cyanosis. No joint deformity upper and lower extremities. Good ROM, no contractures. Normal muscle tone.  Skin: no rashes, lesions, ulcers. No induration Neurologic: CN 2-12 grossly intact. Sensation intact, DTR normal. Strength 5/5 in all 4.  Psychiatric: Normal judgment and insight. Alert and oriented x 3. Normal mood.     Labs on Admission: I have personally reviewed following labs and imaging studies  CBC: Recent Labs  Lab 11/09/24 0348  WBC 6.1  HGB 13.3  HCT 40.3  MCV 95.5  PLT 447*   Basic Metabolic Panel: Recent Labs  Lab 11/09/24 0348  NA 129*  K 4.2  CL 94*  CO2 22  GLUCOSE 166*  BUN 14  CREATININE 0.86  CALCIUM  9.3   GFR: Estimated Creatinine Clearance: 45.4 mL/min (by C-G formula based on SCr of 0.86 mg/dL). Liver Function Tests: Recent Labs  Lab 11/09/24 0348  AST 24  ALT 15  ALKPHOS 46  BILITOT 0.4  PROT 7.2  ALBUMIN 4.5   Recent Labs  Lab 11/09/24 0348  LIPASE 110*   No results for input(s): AMMONIA in the last 168 hours. Coagulation Profile: No results for input(s): INR, PROTIME in the last 168 hours. Cardiac Enzymes: No results for input(s): CKTOTAL, CKMB, CKMBINDEX, TROPONINI in the last 168 hours. BNP (last 3 results) No results for input(s): PROBNP in the last 8760 hours. HbA1C: No results for input(s): HGBA1C in the last 72 hours. CBG: No results for input(s): GLUCAP in the last 168 hours. Lipid Profile: No results for input(s): CHOL, HDL, LDLCALC, TRIG, CHOLHDL, LDLDIRECT in the last 72 hours. Thyroid Function Tests: No  results for input(s): TSH, T4TOTAL, FREET4, T3FREE, THYROIDAB in the last 72 hours. Anemia Panel: No results for input(s): VITAMINB12, FOLATE, FERRITIN, TIBC, IRON, RETICCTPCT in the last 72 hours. Urine analysis:    Component Value Date/Time   COLORURINE STRAW (A) 11/11/2023 1615   APPEARANCEUR CLEAR (A) 11/11/2023 1615   LABSPEC 1.006 11/11/2023 1615   PHURINE 8.0 11/11/2023 1615   GLUCOSEU NEGATIVE 11/11/2023 1615   HGBUR MODERATE (A) 11/11/2023 1615   BILIRUBINUR NEGATIVE 11/11/2023 1615   KETONESUR 5 (A) 11/11/2023 1615   PROTEINUR 100 (A) 11/11/2023 1615   NITRITE NEGATIVE 11/11/2023 1615   LEUKOCYTESUR NEGATIVE 11/11/2023 1615    Radiological Exams on Admission: DG Abd Portable 1 View Result Date: 11/09/2024 EXAM: 1 VIEW XRAY OF THE ABDOMEN 11/09/2024 05:59:00 AM COMPARISON: CT abdomen and pelvis 11/09/2024 00:00:00 AM. Prior XR 10/25/2024. CLINICAL HISTORY: 78 year old female, post nasogastric tube placement. FINDINGS: LINES, TUBES AND DEVICES: Enteric tube in place with tip and side port projecting over the stomach in the left upper quadrant. BOWEL: Extensive gas lucency in the visible abdomen appears related to the gas distended bowel loops demonstrated on CT this morning and stable from the CT scout view. BONES/OTHER: Stable lung bases. No acute fracture. IMPRESSION: 1. Satisfactory enteric tube placement into the stomach. 2. Diffuse gaseous distention of bowel stable from CT 0418 hours today. Electronically signed by: Helayne Hurst MD 11/09/2024 06:09 AM EST RP Workstation: [id number]   CT ABDOMEN PELVIS W CONTRAST Result Date: 11/09/2024 EXAM: CT ABDOMEN AND PELVIS WITH CONTRAST 11/09/2024 04:23:11 AM TECHNIQUE: CT of the abdomen and pelvis was performed with the administration of 100 mL of iohexol  (OMNIPAQUE ) 300 MG/ML solution. Multiplanar reformatted images are provided for review. Automated exposure control, iterative reconstruction, and/or weight-based  adjustment of the mA/kV was utilized to reduce the radiation dose to as low as reasonably achievable. COMPARISON: CT with IV contrast 08/23/2024 and 10/04/2024. CLINICAL HISTORY: Bowel obstruction suspected. Recent history of small bowel obstruction and volvulus. There is increasing tightness and distention of the patient's abdomen. Exploratory laparotomy was done 09/29/2024 with a partial colectomy. FINDINGS: LOWER CHEST: There is mild cardiomegaly, with a small chronic pericardial effusion. Cylindrical bronchiectasis with scattered bronchial impactions again noted in the posteromedial basal left lower lobe and probably reflects changes due to chronic aspiration. The lung bases are otherwise clear. 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 Small hiatal hernia. LIVER: The liver is unremarkable. GALLBLADDER AND BILE DUCTS: Single subcentimeter stone  noted in the gallbladder without wall thickening or biliary dilatation. SPLEEN: No acute abnormality. PANCREAS: No acute abnormality. ADRENAL GLANDS: There is no adrenal mass. KIDNEYS, URETERS AND BLADDER: No renal mass. Asymmetric atrophy, scarring, and cysts of the left kidney is again noted. No follow-up imaging is recommended. No stones in the kidneys or ureters. No hydronephrosis. No perinephric or periureteral stranding. Urinary bladder is unremarkable. GI AND BOWEL: It is difficult to trace the anatomy of the patient's bowel resections given the lack of enteric contrast, presence of ascites and limited peritoneal body fat. Undigested medication tablets are again scattered throughout the bowel. As best I can tell, she probably had a right hemicolectomy at least to the mid transverse colon with a primary anastomosis which is probably in the upper central pelvis. The stomach is dilated with fluid and air as on the last CT. There is small bowel dilation in the upper, mid and lower abdomen up to 3.7 cm with moderate retained stool in  the visualized colon. Multiple posterior pelvic small bowel loops are completely collapsed, consistent with high-grade obstruction but I could not identify the site of obstruction. At this point, the etiology could be an internal hernia or adhesive disease. Colonic segments in the left mid and lower abdomen are distended with stool, which was also seen previously. There is no bowel pneumatosis or portal venous gas. PERITONEUM AND RETROPERITONEUM: There is mild free ascites. No free air. No free hemorrhage is seen. No abdominal wall hernia is seen. VASCULATURE: Aorta is normal in caliber. There is patchy aortoiliac atherosclerosis without aneurysm. LYMPH NODES: No lymphadenopathy. REPRODUCTIVE ORGANS: No acute abnormality. BONES AND SOFT TISSUES: There are degenerative changes and mild levoscoliosis of the thoracic spine with osteopenia. No acute or significant osseous abnormality. No focal soft tissue abnormality. IMPRESSION: 1. High-grade small bowel obstruction with dilated stomach and small bowel loops up to 3.7 cm, and collapsed posterior pelvic small bowel loops, with the transition point not identified; etiology could be internal hernia or adhesive disease; no bowel pneumatosis or portal venous gas. 2. Surgical changes noted to both small and large bowel with indigested medication tablets scattered throughout the bowel 3.  No free air, bowel pneumatosis  or free hemorrhage. 4. Mild free ascites. 5. Left lower lobe bronchiectasis and mucus plugging , most likely indicating chronic aspiration. 6. Dilated esophagus compatible with chronic dysmotility. . . Electronically signed by: Francis Quam MD 11/09/2024 05:14 AM EST RP Workstation: [id number]    EKG: None   Assessment/Plan Principal Problem:   SBO (small bowel obstruction) (HCC)  (please populate well all problems here in Problem List. (For example, if patient is on BP meds at home and you resume or decide to hold them, it is a problem that needs to  be her. Same for CAD, COPD, HLD and so on)  Recurrent small bowel obstruction - Likely related to recent bowel surgery of hemicolectomy - General Surgeon consult appreciated - N.p.o. IV fluid - NGT in place for decompression. - Change p.o. meds to as needed - GI prophylaxis with PPI  HTN - As needed hydralazine   Hyponatremia - Hypovolemic likely secondary to SBO, GI symptoms - IV hydration  Lipase elevation - Pancreatitis ruled out.  Lipase elevation secondary to SBO    DVT prophylaxis: Lovenox  Code Status: Full code Family Communication: None at bedside Disposition Plan: Patient is sick with SBO requiring NGT n.p.o. IV fluid and inpatient surgical consultation, expect more than 2 midnight hospital stay. Consults called: General Surgery Admission  status: Telemetry admission   Cort ONEIDA Mana MD [facility] Pager [phone]  11/09/2024, 8:35 AM       [1] No Known Allergies  "

## 2024-11-09 NOTE — Progress Notes (Signed)
 Patient ID: Anna Castaneda, female   DOB: 1946-05-05, 78 y.o.   MRN: [id number]  Patient seen this morning.  Significant abdominal distention, tympanic but nontender to palpation.  No sign of peritonitis.  CT scan also reviewed.  Significant dilation of proximal small bowel and stomach.  Significant stool burden in the large intestine.  Agree with initial management with NGT for decompression and IV hydration due to dehydration.  Lactic acidosis already improved with IV fluids.  There is hyponatremia of 129 that show also improved with hydration.  Once patient hydrated will reassess tomorrow.  Due to quick recurrences of bowel obstruction we will have to discuss about continuing conservative management versus surgical intervention.  At this point since there is no indication for emergent surgery, we will continue with conservative management.  Will continue to follow closely.  Lucas Sjogren, MD

## 2024-11-09 NOTE — ED Provider Notes (Addendum)
 "  [facility] Provider Note    Event Date/Time   First MD Initiated Contact with Patient 11/09/24 [phone]     (approximate)   History   Abdominal Pain   HPI Anna Castaneda is a 78 y.o. female  with a history of volvulus about 6 weeks ago requiring emergency surgery and then about 2 weeks ago she had an episode of SBO/ileus.  Also required hospitalization. She presents tonight with abdominal distention, nausea, generalized pain, and not passing gas, feeling just like the time she had a volvulus.  I was the provider that saw her on that visit and her presentation tonight is similar.  She said that about 12 hours ago she felt like her abdomen shut down and nothing was moving.  The pain started a few hours ago.  She has had nausea but no vomiting.  Nothing in particular makes it better or worse including taking a couple of senna to try and prompt a bowel movement.  She says she has not passed gas in at least 12 hours.     Physical Exam   Triage Vital Signs: ED Triage Vitals  Encounter Vitals Group     BP 11/09/24 0338 (!) 156/77     Girls Systolic BP Percentile --      Girls Diastolic BP Percentile --      Boys Systolic BP Percentile --      Boys Diastolic BP Percentile --      Pulse Rate 11/09/24 0338 72     Resp --      Temp 11/09/24 0338 (!) 97.5 F (36.4 C)     Temp Source 11/09/24 0338 Oral     SpO2 11/09/24 0338 100 %     Weight 11/09/24 0339 53.3 kg (117 lb 8.1 oz)     Height 11/09/24 0339 1.626 m (5' 4)     Head Circumference --      Peak Flow --      Pain Score 11/09/24 0339 4     Pain Loc --      Pain Education --      Exclude from Growth Chart --     Most recent vital signs: Vitals:   11/09/24 0630 11/09/24 0711  BP: 138/77   Pulse: 79   Resp: (!) 22   Temp:    SpO2: 99% 99%    General: Awake, appears very uncomfortable but nontoxic. CV:  Good peripheral perfusion.  Regular rate and rhythm. Resp:  Normal effort. Speaking easily and  comfortably, no accessory muscle usage nor intercostal retractions.   Abd:  Distended abdomen, tight, tympanitic, tender to palpation throughout consistent with peritonitis.   ED Results / Procedures / Treatments   Labs (all labs ordered are listed, but only abnormal results are displayed) Labs Reviewed  LIPASE, BLOOD - Abnormal; Notable for the following components:      Result Value   Lipase 110 (*)    All other components within normal limits  COMPREHENSIVE METABOLIC PANEL WITH GFR - Abnormal; Notable for the following components:   Sodium 129 (*)    Chloride 94 (*)    Glucose, Bld 166 (*)    All other components within normal limits  CBC - Abnormal; Notable for the following components:   RDW 16.0 (*)    Platelets 447 (*)    All other components within normal limits  LACTIC ACID, PLASMA - Abnormal; Notable for the following components:   Lactic Acid, Venous 2.1 (*)  All other components within normal limits  LACTIC ACID, PLASMA  URINALYSIS, ROUTINE W REFLEX MICROSCOPIC       RADIOLOGY See ED course for details   PROCEDURES:  Critical Care performed: No  Procedures    IMPRESSION / MDM / ASSESSMENT AND PLAN / ED COURSE  I reviewed the triage vital signs and the nursing notes.                              Differential diagnosis includes, but is not limited to, volvulus, SBO/ileus, postoperative infection.  Patient's presentation is most consistent with acute presentation with potential threat to life or bodily function.  Labs/studies ordered: CBC, lactic acid, CMP, lipase, CT abdomen pelvis.  Interventions/Medications given:  Medications  iohexol  (OMNIPAQUE ) 300 MG/ML solution 100 mL (100 mLs Intravenous Contrast Given 11/09/24 0415)  lactated ringers  bolus 1,000 mL (0 mLs Intravenous Stopped 11/09/24 0515)  morphine  (PF) 4 MG/ML injection 4 mg (4 mg Intravenous Given 11/09/24 0701)  ondansetron  (ZOFRAN ) injection 4 mg (4 mg Intravenous Given 11/09/24  0659)    (Note:  hospital course my include additional interventions and/or labs/studies not listed above.)   Patient is obviously in pain but is refusing analgesia and antiemetics at this time.  Vital signs stable.  Lab work is pending but I ask her and she wants to proceed with the CT scan without checking labs.  She has no history of renal failure and given the possibility of volvulus timing is important.  We will proceed with the imaging.     Clinical Course as of 11/09/24 0729  Sat Nov 09, 2024  0403 Spoke with Darlyn the CT technologist and explained I would like to proceed with CT scan without awaiting lab results.  He says he understands and will come get her [CF]  0429 Slightly elevated lipase, slightly decreased sodium, stable renal function [CF]  0439 Lactic Acid, Venous(!!): 2.1 [CF]  0439 CT ABDOMEN PELVIS W CONTRAST I independently viewed and interpreted the patient's abd/pelvis CT, and I am very concerned about bowel obstruction.  Unclear if this may also represent a volvulus.  I am awaiting the official report. [CF]  0440 Lactic slightly elevated and I have ordered LR 1 L IV bolus to fluid resuscitate the patient [CF]  0451 Given the concerns over for possible recurrent volvulus, I am paging Dr. Marinda who is on-call for surgery for his emergent assessment of the patient. [CF]  [phone] Consulted with Dr. Marinda with surgery who is going to review the imaging and get back to me [CF]  0526 Dr. Marinda advised me that he has reviewed the imaging and he is coming to see the patient emergently.  Ordering NGT as per his request [CF]  0628 Dr. Marinda has evaluated the patient and advises that there is no need for emergent surgery at this time.  He recommends admission to the hospitalist team and surgery will follow. [CF]  [id number] I am consulting the hospitalist team for admission.   [CF]  0728 I consulted by phone with the admitting hospitalist, and they will admit the patient - Dr. Cort Mana. [CF]    Clinical Course User Index [CF] Gordan Huxley, MD     FINAL CLINICAL IMPRESSION(S) / ED DIAGNOSES   Final diagnoses:  Complete intestinal obstruction, unspecified cause (HCC)     Rx / DC Orders   ED Discharge Orders     None  Note:  This document was prepared using Dragon voice recognition software and may include unintentional dictation errors.   Gordan Huxley, MD 11/09/24 9370    Gordan Huxley, MD 11/09/24 [phone]  "

## 2024-11-09 NOTE — ED Triage Notes (Signed)
 Pt presents for abdominal pain and distention. Recent hx of SBO and volvulus. Abdomen does feel firm and distended. Last BM yesterday- no abnormalities. Denies nausea, vomiting, fevers. Denies passing gas today.

## 2024-11-09 NOTE — ED Notes (Signed)
 Surgery at bedside.

## 2024-11-09 NOTE — ED Notes (Signed)
 DR Ninfa at bedside, this tech will take pt up to inpt room after

## 2024-11-10 ENCOUNTER — Encounter: Admission: EM | Disposition: E | Payer: Self-pay | Source: Home / Self Care | Attending: Internal Medicine

## 2024-11-10 ENCOUNTER — Inpatient Hospital Stay: Admitting: General Practice

## 2024-11-10 ENCOUNTER — Inpatient Hospital Stay

## 2024-11-10 DIAGNOSIS — K56609 Unspecified intestinal obstruction, unspecified as to partial versus complete obstruction: Secondary | ICD-10-CM | POA: Diagnosis not present

## 2024-11-10 HISTORY — PX: LAPAROTOMY: SHX154

## 2024-11-10 LAB — BASIC METABOLIC PANEL WITH GFR
Anion gap: 12 (ref 5–15)
BUN: 12 mg/dL (ref 8–23)
CO2: 25 mmol/L (ref 22–32)
Calcium: 8.5 mg/dL — ABNORMAL LOW (ref 8.9–10.3)
Chloride: 101 mmol/L (ref 98–111)
Creatinine, Ser: 0.82 mg/dL (ref 0.44–1.00)
GFR, Estimated: >60 mL/min
Glucose, Bld: 92 mg/dL (ref 70–99)
Potassium: 3.8 mmol/L (ref 3.5–5.1)
Sodium: 138 mmol/L (ref 135–145)

## 2024-11-10 LAB — CBC
HCT: 39 % (ref 36.0–46.0)
Hemoglobin: 13.1 g/dL (ref 12.0–15.0)
MCH: 31.9 pg (ref 26.0–34.0)
MCHC: 33.6 g/dL (ref 30.0–36.0)
MCV: 94.9 fL (ref 80.0–100.0)
Platelets: 425 K/uL — ABNORMAL HIGH (ref 150–400)
RBC: 4.11 MIL/uL (ref 3.87–5.11)
RDW: 16.4 % — ABNORMAL HIGH (ref 11.5–15.5)
WBC: 8.5 K/uL (ref 4.0–10.5)
nRBC: 0 % (ref 0.0–0.2)

## 2024-11-10 MED ORDER — SUGAMMADEX SODIUM 200 MG/2ML IV SOLN
INTRAVENOUS | Status: DC | PRN
  Administered 2024-11-10: 150 mg via INTRAVENOUS

## 2024-11-10 MED ORDER — ONDANSETRON HCL 4 MG/2ML IJ SOLN
INTRAMUSCULAR | Status: AC
  Filled 2024-11-10: qty 2

## 2024-11-10 MED ORDER — DEXAMETHASONE SOD PHOSPHATE PF 10 MG/ML IJ SOLN
INTRAMUSCULAR | Status: DC | PRN
  Administered 2024-11-10: 6 mg via INTRAVENOUS

## 2024-11-10 MED ORDER — BUPIVACAINE-EPINEPHRINE (PF) 0.25% -1:200000 IJ SOLN
INTRAMUSCULAR | Status: AC
  Filled 2024-11-10: qty 30

## 2024-11-10 MED ORDER — OXYCODONE HCL 5 MG PO TABS: 5.0000 mg | ORAL_TABLET | Freq: Once | ORAL | Status: DC | PRN

## 2024-11-10 MED ORDER — EPHEDRINE 5 MG/ML INJ
INTRAVENOUS | Status: AC
  Filled 2024-11-10: qty 5

## 2024-11-10 MED ORDER — ROCURONIUM BROMIDE 100 MG/10ML IV SOLN
INTRAVENOUS | Status: DC | PRN
  Administered 2024-11-10: 40 mg via INTRAVENOUS
  Administered 2024-11-10: 20 mg via INTRAVENOUS

## 2024-11-10 MED ORDER — FENTANYL CITRATE (PF) 100 MCG/2ML IJ SOLN
INTRAMUSCULAR | Status: AC
  Filled 2024-11-10: qty 2

## 2024-11-10 MED ORDER — ROCURONIUM BROMIDE 10 MG/ML (PF) SYRINGE
PREFILLED_SYRINGE | INTRAVENOUS | Status: AC
  Filled 2024-11-10: qty 10

## 2024-11-10 MED ORDER — PHENYLEPHRINE 80 MCG/ML (10ML) SYRINGE FOR IV PUSH (FOR BLOOD PRESSURE SUPPORT)
PREFILLED_SYRINGE | INTRAVENOUS | Status: DC | PRN
  Administered 2024-11-10 (×4): 160 ug via INTRAVENOUS
  Administered 2024-11-10 (×4): 80 ug via INTRAVENOUS

## 2024-11-10 MED ORDER — LACTATED RINGERS IV SOLN: INTRAVENOUS | Status: DC | PRN

## 2024-11-10 MED ORDER — SUCCINYLCHOLINE CHLORIDE 200 MG/10ML IV SOSY
PREFILLED_SYRINGE | INTRAVENOUS | Status: AC
  Filled 2024-11-10: qty 10

## 2024-11-10 MED ORDER — ONDANSETRON HCL 4 MG/2ML IJ SOLN
INTRAMUSCULAR | Status: DC | PRN
  Administered 2024-11-10: 4 mg via INTRAVENOUS

## 2024-11-10 MED ORDER — OXYCODONE HCL 5 MG/5ML PO SOLN: 5.0000 mg | Freq: Once | ORAL | Status: DC | PRN

## 2024-11-10 MED ORDER — FENTANYL CITRATE (PF) 100 MCG/2ML IJ SOLN
25.0000 ug | INTRAMUSCULAR | Status: DC | PRN
  Administered 2024-11-10 (×2): 25 ug via INTRAVENOUS
  Administered 2024-11-10: 50 ug via INTRAVENOUS

## 2024-11-10 MED ORDER — SODIUM CHLORIDE 0.9 % IV SOLN: INTRAVENOUS | Status: AC

## 2024-11-10 MED ORDER — SUCCINYLCHOLINE CHLORIDE 200 MG/10ML IV SOSY
PREFILLED_SYRINGE | INTRAVENOUS | Status: DC | PRN
  Administered 2024-11-10: 80 mg via INTRAVENOUS

## 2024-11-10 MED ORDER — SODIUM CHLORIDE (PF) 0.9 % IJ SOLN
INTRAMUSCULAR | Status: AC
  Filled 2024-11-10: qty 50

## 2024-11-10 MED ORDER — PHENYLEPHRINE 80 MCG/ML (10ML) SYRINGE FOR IV PUSH (FOR BLOOD PRESSURE SUPPORT)
PREFILLED_SYRINGE | INTRAVENOUS | Status: AC
  Filled 2024-11-10: qty 10

## 2024-11-10 MED ORDER — LIDOCAINE HCL (PF) 2 % IJ SOLN
INTRAMUSCULAR | Status: AC
  Filled 2024-11-10: qty 5

## 2024-11-10 MED ORDER — ACETAMINOPHEN 10 MG/ML IV SOLN
INTRAVENOUS | Status: AC
  Filled 2024-11-10: qty 100

## 2024-11-10 MED ORDER — PROPOFOL 10 MG/ML IV BOLUS
INTRAVENOUS | Status: DC | PRN
  Administered 2024-11-10: 150 mg via INTRAVENOUS

## 2024-11-10 MED ORDER — LIDOCAINE HCL (CARDIAC) PF 100 MG/5ML IV SOSY
PREFILLED_SYRINGE | INTRAVENOUS | Status: DC | PRN
  Administered 2024-11-10: 60 mg via INTRAVENOUS

## 2024-11-10 MED ORDER — PROPOFOL 10 MG/ML IV BOLUS
INTRAVENOUS | Status: AC
  Filled 2024-11-10: qty 20

## 2024-11-10 MED ORDER — EPHEDRINE SULFATE-NACL 50-0.9 MG/10ML-% IV SOSY
PREFILLED_SYRINGE | INTRAVENOUS | Status: DC | PRN
  Administered 2024-11-10: 5 mg via INTRAVENOUS
  Administered 2024-11-10: 10 mg via INTRAVENOUS
  Administered 2024-11-10 (×2): 5 mg via INTRAVENOUS
  Administered 2024-11-10: 10 mg via INTRAVENOUS

## 2024-11-10 MED ORDER — ACETAMINOPHEN 10 MG/ML IV SOLN
INTRAVENOUS | Status: DC | PRN
  Administered 2024-11-10: 799.5 mg via INTRAVENOUS

## 2024-11-10 MED ORDER — CEFAZOLIN SODIUM-DEXTROSE 2-4 GM/100ML-% IV SOLN
INTRAVENOUS | Status: AC
  Filled 2024-11-10: qty 100

## 2024-11-10 MED ORDER — CEFAZOLIN SODIUM-DEXTROSE 2-4 GM/100ML-% IV SOLN
2.0000 g | Freq: Once | INTRAVENOUS | Status: AC
  Administered 2024-11-10: 2 g via INTRAVENOUS
  Filled 2024-11-10: qty 100

## 2024-11-10 MED ORDER — FENTANYL CITRATE (PF) 100 MCG/2ML IJ SOLN
INTRAMUSCULAR | Status: DC | PRN
  Administered 2024-11-10: 50 ug via INTRAVENOUS
  Administered 2024-11-10: 25 ug via INTRAVENOUS

## 2024-11-10 MED ORDER — BUPIVACAINE LIPOSOME 1.3 % IJ SUSP
INTRAMUSCULAR | Status: AC
  Filled 2024-11-10: qty 20

## 2024-11-10 MED ORDER — SODIUM CHLORIDE (PF) 0.9 % IJ SOLN
INTRAMUSCULAR | Status: DC | PRN
  Administered 2024-11-10: 80 mL via SUBCUTANEOUS

## 2024-11-10 NOTE — Plan of Care (Signed)
 Tolerating NGT to low intermittent suction well. Bile drainage from suction. Abdominal distention with pain managed with prn Toradol  and Dilaudid . Not passing gas but active bowel sounds auscultated to LUQ/ RUQ.

## 2024-11-10 NOTE — Anesthesia Postprocedure Evaluation (Signed)
"   Anesthesia Post Note  Patient: Negin Hegg  Procedure(s) Performed: LAPAROTOMY, EXPLORATORY PARTIAL COLECTOMY  Patient location during evaluation: PACU Anesthesia Type: General Level of consciousness: awake and alert Pain management: pain level controlled Vital Signs Assessment: post-procedure vital signs reviewed and stable Respiratory status: spontaneous breathing, nonlabored ventilation, respiratory function stable and patient connected to nasal cannula oxygen Cardiovascular status: blood pressure returned to baseline and stable Postop Assessment: no apparent nausea or vomiting Anesthetic complications: no   No notable events documented.   Last Vitals:  Vitals:   11/10/24 1600 11/10/24 1615  BP: 134/61 129/62  Pulse: 77 70  Resp: 12 12  Temp: (!) 36.2 C   SpO2: 94% 95%    Last Pain:  Vitals:   11/10/24 1615  TempSrc:   PainSc: Asleep                 Debby Mines      "

## 2024-11-10 NOTE — Progress Notes (Signed)
 " PROGRESS NOTE    Anna Castaneda  FMW:[id number] DOB: 05-20-1946 DOA: 11/09/2024 PCP: Sadie Manna, MD   Assessment & Plan:   Principal Problem:   SBO (small bowel obstruction) (HCC)  Assessment and Plan: Recurrent small bowel obstruction: etiology unclear, possibly secondary to recent hemicolectomy in Nov 2025. Will go for ex lap as per gen surg. Continue on IVFs. Continue w/ NG tube. NPO. Gen surg following and recs apprec   Elevated BP: w/o hx of HTN. Hydralazine  prn    Hyponatremia: WNL today    Lipase elevation: etiology unclear, possible secondary to SBO.   Thrombocytosis: etiology unclear, possibly reactive. Will continue to monitor      DVT prophylaxis: lovenox   Code Status: full  Family Communication:  Disposition Plan: likely d/c back home   Level of care: Telemetry  Status is: Inpatient Remains inpatient appropriate because: severity of illness    Consultants:  Gen surg   Procedures:   Antimicrobials:   Subjective: Pt c/o feeling that her BP is high   Objective: Vitals:   11/09/24 2013 11/09/24 2144 11/10/24 0421 11/10/24 0747  BP: (!) 159/79 (!) 100/58 139/74 122/61  Pulse: 74 69 81 64  Resp: 16   16  Temp: 98.7 F (37.1 C)  (!) 97.4 F (36.3 C) (!) 97.5 F (36.4 C)  TempSrc: Oral     SpO2: 97%  100% 100%  Weight:      Height:        Intake/Output Summary (Last 24 hours) at 11/10/2024 1018 Last data filed at 11/10/2024 0422 Gross per 24 hour  Intake 90 ml  Output --  Net 90 ml   Filed Weights   11/09/24 0339  Weight: 53.3 kg    Examination:  General exam: Appears anxious Respiratory system: Clear to auscultation. Respiratory effort normal. Cardiovascular system: S1 & S2+. No rubs, gallops or clicks.  Gastrointestinal system: Abdomen is distended, soft and nontender. Hypoactive bowel sounds heard. Central nervous system: Alert and oriented.  Psychiatry: Judgement and insight appears at baseline. Anxious mood and  affect    Data Reviewed: I have personally reviewed following labs and imaging studies  CBC: Recent Labs  Lab 11/09/24 0348 11/10/24 0439  WBC 6.1 8.5  HGB 13.3 13.1  HCT 40.3 39.0  MCV 95.5 94.9  PLT 447* 425*   Basic Metabolic Panel: Recent Labs  Lab 11/09/24 0348 11/10/24 0439  NA 129* 138  K 4.2 3.8  CL 94* 101  CO2 22 25  GLUCOSE 166* 92  BUN 14 12  CREATININE 0.86 0.82  CALCIUM  9.3 8.5*   GFR: Estimated Creatinine Clearance: 47.6 mL/min (by C-G formula based on SCr of 0.82 mg/dL). Liver Function Tests: Recent Labs  Lab 11/09/24 0348  AST 24  ALT 15  ALKPHOS 46  BILITOT 0.4  PROT 7.2  ALBUMIN 4.5   Recent Labs  Lab 11/09/24 0348  LIPASE 110*   No results for input(s): AMMONIA in the last 168 hours. Coagulation Profile: No results for input(s): INR, PROTIME in the last 168 hours. Cardiac Enzymes: No results for input(s): CKTOTAL, CKMB, CKMBINDEX, TROPONINI in the last 168 hours. BNP (last 3 results) No results for input(s): PROBNP in the last 8760 hours. HbA1C: No results for input(s): HGBA1C in the last 72 hours. CBG: No results for input(s): GLUCAP in the last 168 hours. Lipid Profile: No results for input(s): CHOL, HDL, LDLCALC, TRIG, CHOLHDL, LDLDIRECT in the last 72 hours. Thyroid Function Tests: No results for input(s): TSH, T4TOTAL,  FREET4, T3FREE, THYROIDAB in the last 72 hours. Anemia Panel: No results for input(s): VITAMINB12, FOLATE, FERRITIN, TIBC, IRON, RETICCTPCT in the last 72 hours. Sepsis Labs: Recent Labs  Lab 11/09/24 0348 11/09/24 0653  LATICACIDVEN 2.1* 0.9    No results found for this or any previous visit (from the past 240 hours).       Radiology Studies: DG Abd 2 Views Result Date: 11/10/2024 EXAM: 2 VIEW XRAY OF THE ABDOMEN 11/10/2024 07:42:00 AM COMPARISON: CT abdomen and pelvis 11/09/2024. CLINICAL HISTORY: 78 year old female with bowel obstruction  and hepatocellular carcinoma. FINDINGS: LINES, TUBES AND DEVICES: Stable and satisfactory enteric tube placement. BOWEL: Similar diffuse gaseous dilation of bowel loops. Lucency in the upper abdomen appears stable from the CT abdomen and pelvis scout view yesterday, with dilated loops but no convincing pneumoperitoneum. Gas distended bowel loops continue throughout the abdomen and pelvis. Mechanical bowel obstruction was favored by CT. SOFT TISSUES: Surgical sutures in the left abdomen. No abnormal calcifications. BONES: No acute fracture. IMPRESSION: 1. Ongoing extensive gas distended bowel loops throughout the abdomen and pelvis. No convincing pneumoperitoneum. Mechanical bowel obstruction favored on recent CT. 2. Stable Enteric tube. Electronically signed by: Helayne Hurst MD 11/10/2024 08:02 AM EST RP Workstation: [id number]   DG Abd Portable 1 View Result Date: 11/09/2024 EXAM: 1 VIEW XRAY OF THE ABDOMEN 11/09/2024 05:59:00 AM COMPARISON: CT abdomen and pelvis 11/09/2024 00:00:00 AM. Prior XR 10/25/2024. CLINICAL HISTORY: 78 year old female, post nasogastric tube placement. FINDINGS: LINES, TUBES AND DEVICES: Enteric tube in place with tip and side port projecting over the stomach in the left upper quadrant. BOWEL: Extensive gas lucency in the visible abdomen appears related to the gas distended bowel loops demonstrated on CT this morning and stable from the CT scout view. BONES/OTHER: Stable lung bases. No acute fracture. IMPRESSION: 1. Satisfactory enteric tube placement into the stomach. 2. Diffuse gaseous distention of bowel stable from CT 0418 hours today. Electronically signed by: Helayne Hurst MD 11/09/2024 06:09 AM EST RP Workstation: [id number]   CT ABDOMEN PELVIS W CONTRAST Result Date: 11/09/2024 EXAM: CT ABDOMEN AND PELVIS WITH CONTRAST 11/09/2024 04:23:11 AM TECHNIQUE: CT of the abdomen and pelvis was performed with the administration of 100 mL of iohexol  (OMNIPAQUE ) 300 MG/ML solution.  Multiplanar reformatted images are provided for review. Automated exposure control, iterative reconstruction, and/or weight-based adjustment of the mA/kV was utilized to reduce the radiation dose to as low as reasonably achievable. COMPARISON: CT with IV contrast 08/23/2024 and 10/04/2024. CLINICAL HISTORY: Bowel obstruction suspected. Recent history of small bowel obstruction and volvulus. There is increasing tightness and distention of the patient's abdomen. Exploratory laparotomy was done 09/29/2024 with a partial colectomy. FINDINGS: LOWER CHEST: There is mild cardiomegaly, with a small chronic pericardial effusion. Cylindrical bronchiectasis with scattered bronchial impactions again noted in the posteromedial basal left lower lobe and probably reflects changes due to chronic aspiration. The lung bases are otherwise clear. 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 Small hiatal hernia. LIVER: The liver is unremarkable. GALLBLADDER AND BILE DUCTS: Single subcentimeter stone noted in the gallbladder without wall thickening or biliary dilatation. SPLEEN: No acute abnormality. PANCREAS: No acute abnormality. ADRENAL GLANDS: There is no adrenal mass. KIDNEYS, URETERS AND BLADDER: No renal mass. Asymmetric atrophy, scarring, and cysts of the left kidney is again noted. No follow-up imaging is recommended. No stones in the kidneys or ureters. No hydronephrosis. No perinephric or periureteral stranding. Urinary bladder is unremarkable. GI AND BOWEL: It is difficult to trace  the anatomy of the patient's bowel resections given the lack of enteric contrast, presence of ascites and limited peritoneal body fat. Undigested medication tablets are again scattered throughout the bowel. As best I can tell, she probably had a right hemicolectomy at least to the mid transverse colon with a primary anastomosis which is probably in the upper central pelvis. The stomach is dilated with fluid and air  as on the last CT. There is small bowel dilation in the upper, mid and lower abdomen up to 3.7 cm with moderate retained stool in the visualized colon. Multiple posterior pelvic small bowel loops are completely collapsed, consistent with high-grade obstruction but I could not identify the site of obstruction. At this point, the etiology could be an internal hernia or adhesive disease. Colonic segments in the left mid and lower abdomen are distended with stool, which was also seen previously. There is no bowel pneumatosis or portal venous gas. PERITONEUM AND RETROPERITONEUM: There is mild free ascites. No free air. No free hemorrhage is seen. No abdominal wall hernia is seen. VASCULATURE: Aorta is normal in caliber. There is patchy aortoiliac atherosclerosis without aneurysm. LYMPH NODES: No lymphadenopathy. REPRODUCTIVE ORGANS: No acute abnormality. BONES AND SOFT TISSUES: There are degenerative changes and mild levoscoliosis of the thoracic spine with osteopenia. No acute or significant osseous abnormality. No focal soft tissue abnormality. IMPRESSION: 1. High-grade small bowel obstruction with dilated stomach and small bowel loops up to 3.7 cm, and collapsed posterior pelvic small bowel loops, with the transition point not identified; etiology could be internal hernia or adhesive disease; no bowel pneumatosis or portal venous gas. 2. Surgical changes noted to both small and large bowel with indigested medication tablets scattered throughout the bowel 3.  No free air, bowel pneumatosis  or free hemorrhage. 4. Mild free ascites. 5. Left lower lobe bronchiectasis and mucus plugging , most likely indicating chronic aspiration. 6. Dilated esophagus compatible with chronic dysmotility. . . Electronically signed by: Francis Quam MD 11/09/2024 05:14 AM EST RP Workstation: [id number]        Scheduled Meds:  enoxaparin  (LOVENOX ) injection  40 mg Subcutaneous Q24H   pantoprazole  (PROTONIX ) IV  40 mg Intravenous  Q24H   Continuous Infusions:   ceFAZolin  (ANCEF ) IV       LOS: 1 day       Anthony CHRISTELLA Pouch, MD [facility] Pager [phone]  If 7PM-7AM, please contact night-coverage [email or web address] 11/10/2024, 10:18 AM   "

## 2024-11-10 NOTE — Progress Notes (Signed)
 Patient ID: Anna Castaneda, female   DOB: [birth year], 78 y.o.   MRN: [id number]     SURGICAL PROGRESS NOTE   Hospital Day(s): 1.   Interval History: Patient seen and examined, no acute events or new complaints overnight. Patient reports feeling the same.  She denies passing gas.  She denies any bowel movement.  She does hear her intestines moving but no gas.  She is still having intermittent generalized abdominal pain.  No pain radiation.  She cannot identify any alleviating or aggravating factors.  I repeated abdominal x-ray this morning with persistent small bowel dilation.  I personally evaluated the images.  Radiologist reported ongoing extensive gas distended bowel loops throughout the abdomen and pelvis.  Mechanical bowel obstruction favored on recent CT.  Vital signs in last 24 hours: [min-max] current  Temp:  [97.4 F (36.3 C)-98.8 F (37.1 C)] 97.5 F (36.4 C) (12/28 0747) Pulse Rate:  [64-81] 64 (12/28 0747) Resp:  [16] 16 (12/28 0747) BP: (100-159)/(58-79) 122/61 (12/28 0747) SpO2:  [97 %-100 %] 100 % (12/28 0747)     Height: 5' 4 (162.6 cm) Weight: 53.3 kg BMI (Calculated): 20.16   Physical Exam:  Constitutional: alert, cooperative and no distress  Respiratory: breathing non-labored at rest  Cardiovascular: regular rate and sinus rhythm  Gastrointestinal: Very distended abdomen and tympanic.  Labs:     Latest Ref Rng & Units 11/10/2024    4:39 AM 11/09/2024    3:48 AM 10/25/2024    5:02 AM  CBC  WBC 4.0 - 10.5 K/uL 8.5  6.1  7.6   Hemoglobin 12.0 - 15.0 g/dL 86.8  86.6  88.2   Hematocrit 36.0 - 46.0 % 39.0  40.3  34.8   Platelets 150 - 400 K/uL 425  447  344       Latest Ref Rng & Units 11/10/2024    4:39 AM 11/09/2024    3:48 AM 10/25/2024    5:02 AM  CMP  Glucose 70 - 99 mg/dL 92  833  52   BUN 8 - 23 mg/dL 12  14  13    Creatinine 0.44 - 1.00 mg/dL 9.17  9.13  9.34   Sodium 135 - 145 mmol/L 138  129  138   Potassium 3.5 - 5.1 mmol/L 3.8  4.2  4.1   Chloride  98 - 111 mmol/L 101  94  99   CO2 22 - 32 mmol/L 25  22  23    Calcium  8.9 - 10.3 mg/dL 8.5  9.3  7.6   Total Protein 6.5 - 8.1 g/dL  7.2    Total Bilirubin 0.0 - 1.2 mg/dL  0.4    Alkaline Phos 38 - 126 U/L  46    AST 15 - 41 U/L  24    ALT 0 - 44 U/L  15      Assessment/Plan:  78 y.o. female with recurrent small bowel obstruction.  Patient with recurrent small bowel obstruction.  From last CT scan with significant collapse distal small intestine more consistent with mechanical bowel obstruction.  Due to the frequency of the recurrences and CT scan now more concerning of mechanical obstruction, differential diagnosis internal hernia versus adhesive disease, discussed with patient to take her back to the operating room for exploratory laparotomy.  Discussed with patient risks of surgery including bleeding, infection, injury to adjacent organ, enterocutaneous fistula, bowel perforation, intra-abdominal infection, wound infection, among others.  The patient reports she understood and agreed to proceed with exploratory laparotomy.  Lucas Petrin, MD

## 2024-11-10 NOTE — Anesthesia Procedure Notes (Signed)
 Procedure Name: Intubation Date/Time: 11/10/2024 1:08 PM  Performed by: Jaylene Nest, CRNAPre-anesthesia Checklist: Patient identified, Patient being monitored, Timeout performed, Emergency Drugs available and Suction available Patient Re-evaluated:Patient Re-evaluated prior to induction Oxygen Delivery Method: Circle system utilized Preoxygenation: Pre-oxygenation with 100% oxygen Induction Type: IV induction Ventilation: Mask ventilation without difficulty Laryngoscope Size: Mac and 3 Grade View: Grade I Tube type: Oral Tube size: 7.0 mm Number of attempts: 1 Airway Equipment and Method: Stylet and Video-laryngoscopy Placement Confirmation: ETT inserted through vocal cords under direct vision, positive ETCO2 and breath sounds checked- equal and bilateral Secured at: 21 cm Tube secured with: Tape Dental Injury: Teeth and Oropharynx as per pre-operative assessment

## 2024-11-10 NOTE — Transfer of Care (Signed)
 Immediate Anesthesia Transfer of Care Note  Patient: Anna Castaneda  Procedure(s) Performed: LAPAROTOMY, EXPLORATORY PARTIAL COLECTOMY  Patient Location: PACU  Anesthesia Type:General  Level of Consciousness: drowsy  Airway & Oxygen Therapy: Patient Spontanous Breathing and Patient connected to face mask oxygen  Post-op Assessment: Report given to RN and Post -op Vital signs reviewed and stable  Post vital signs: Reviewed and stable  Last Vitals:  Vitals Value Taken Time  BP 120/58 11/10/24 15:30  Temp    Pulse 72 11/10/24 15:33  Resp 13 11/10/24 15:33  SpO2 100 % 11/10/24 15:33  Vitals shown include unfiled device data.  Last Pain:  Vitals:   11/10/24 0951  TempSrc:   PainSc: Asleep      Patients Stated Pain Goal: 0 (11/09/24 0440)  Complications: No notable events documented.

## 2024-11-10 NOTE — Anesthesia Preprocedure Evaluation (Signed)
"                                    Anesthesia Evaluation  Patient identified by MRN, date of birth, ID band Patient awake    Reviewed: Allergy & Precautions, H&P , NPO status , Patient's Chart, lab work & pertinent test results, reviewed documented beta blocker date and time   Airway Mallampati: III  TM Distance: >3 FB Neck ROM: full    Dental  (+) Caps, Dental Advidsory Given, Teeth Intact, Missing   Pulmonary neg pulmonary ROS, former smoker   Pulmonary exam normal breath sounds clear to auscultation       Cardiovascular Exercise Tolerance: Good hypertension, (-) angina + Past MI (broken heart syndrome)  (-) Cardiac Stents Normal cardiovascular exam(-) dysrhythmias (-) Valvular Problems/Murmurs Rhythm:regular Rate:Normal     Neuro/Psych Seizures - (one time), Well Controlled,   negative psych ROS   GI/Hepatic Neg liver ROS,GERD  Controlled and Medicated,,  Endo/Other  negative endocrine ROS    Renal/GU      Musculoskeletal   Abdominal   Peds  Hematology negative hematology ROS (+)   Anesthesia Other Findings Patient with PMH of recurrent small bowel obstructions presents with a SBO. Denies any symptoms of nausea/vomiting, chest pain or SOB.   Past Medical History: 2018: Arthritis 2005: GERD (gastroesophageal reflux disease) No date: Hyperlipidemia No date: Hypertension 02/01/2016: Myocardial infarction [facility])     Comment:  Broken Heart Syndrome 2018: Osteoporosis   Reproductive/Obstetrics negative OB ROS                              Anesthesia Physical Anesthesia Plan  ASA: 4  Anesthesia Plan: General ETT   Post-op Pain Management:    Induction: Intravenous  PONV Risk Score and Plan: 3 and Ondansetron  and Dexamethasone   Airway Management Planned: Oral ETT  Additional Equipment:   Intra-op Plan:   Post-operative Plan: Extubation in OR  Informed Consent: I have reviewed the patients History and  Physical, chart, labs and discussed the procedure including the risks, benefits and alternatives for the proposed anesthesia with the patient or authorized representative who has indicated his/her understanding and acceptance.     Dental Advisory Given  Plan Discussed with: Anesthesiologist, CRNA and Surgeon  Anesthesia Plan Comments: (Patient consented for risks of anesthesia including but not limited to:  - adverse reactions to medications - damage to eyes, teeth, lips or other oral mucosa - nerve damage due to positioning  - sore throat or hoarseness - Damage to heart, brain, nerves, lungs, other parts of body or loss of life  Patient voiced understanding and assent.)        Anesthesia Quick Evaluation  "

## 2024-11-10 NOTE — Op Note (Signed)
 Preoperative diagnosis: Small bowel obstruction  Postoperative diagnosis: Large bowel obstruction  Procedure: Transverse hemicolectomy with primary anastomosis.   Anesthesia: GETA  Surgeon: Dr. Rodolph, MD  Wound Classification: Clean Contaminated  Indications: Patient is a 78 y.o. 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  Findings: Dense adhesion from mesentery to the greater curvature of the stomach causing distal transverse large bowel obstruction with localized ischemic changes of the transverse colon. 2.   Severely dilated proximal large intestine with decreased motility.  Description of procedure:  The patient was placed in the supine position and general endotracheal anesthesia was induced. Sequential pneumatic compression devices were placed on lower extremities. Preoperative antibiotics were given. A Foley catheter and nasogastric tube were placed. The abdomen was prepped and draped in the usual sterile fashion. A time-out was completed verifying correct patient, procedure, site, positioning, and implant(s) and/or special equipment prior to beginning this procedure.  A vertical midline incision was made from xiphoid to just above the pubis. This was deepened through the subcutaneous tissues and hemostasis was achieved with electrocautery. The linea alba was identified and incised and the peritoneal cavity entered.  The abdomen was explored.  The proximal large intestine and distal small intestine were severely dilated.  Interestingly the small intestine at the ligament of Treitz was completely collapsed.  Inspection from Treitz to large intestine found a slow tapering of small bowel dilation distally with severe large bowel dilation.  Open patient of the large intestine a dense band of scar tissue was identified from mesentery to the greater curvature of the stomach causing compression of the large intestine at the  distal transverse colon.  Significantly bruised with early ischemic changes of that portion of the intestine was identified.  Due to these being the second intestinal surgery in 6 weeks I did not want to take the risk of leaving that portion of the large intestine with potential causes of complication such as stricture, ischemia, perforation, obstruction.  I decided to proceed with a partial colectomy of the transverse colon.  I elevated the greater omentum and and separated the omentum from the transverse colon.  I also mobilized the descending colon through the white line of Toldt.  I mobilized the descending colon up to the splenic flexure.  There was any structure was completely mobilized for creation of the better anastomosis.  The points of dissection were identified.  A window under the mesentery was performed and the large intestine was divided with GIA 75 mm.  The mesentery was ligated and divided with LigaSure device.  The specimen was removed.  Hemostasis was checked in the operative field. The two ends of bowel were checked and found to be viable, with excellent blood supply. The proximal and distal segments were brought into apposition and found to lie comfortably next to each other. Two stay sutures of 3-0 Vicryl were placed to approximate the antimesenteric borders of the bowel segments. Enterotomies were made on the antimesenteric corner of the staple line on the ileum and descending colon and the linear cutting stapler inserted and fired. Hemostasis was checked on the staple line. The enterotomies were then closed with a linear stapler. The anastomosis was checked and found to be intact and widely patent. Mesenteric defect was closed with interrupted 3-0 Vicryl. The abdominal cavity was then copiously irrigated and hemostasis was checked.  The fascia was closed with a running suture of PDS 0. The skin was closed with skin staples.  A negative pressure dressing (DME) was applied in layers covering  an area of 2 x 20 cm (40 cm) The patient tolerated the procedure well and was taken to the postanesthesia care unit in stable condition.   Specimen: Transverse colon  Complications: None  Estimated Blood Loss: 100 mL

## 2024-11-11 ENCOUNTER — Encounter: Payer: Self-pay | Admitting: General Surgery

## 2024-11-11 ENCOUNTER — Inpatient Hospital Stay

## 2024-11-11 DIAGNOSIS — K56609 Unspecified intestinal obstruction, unspecified as to partial versus complete obstruction: Secondary | ICD-10-CM | POA: Diagnosis not present

## 2024-11-11 LAB — BASIC METABOLIC PANEL WITH GFR
Anion gap: 8 (ref 5–15)
BUN: 13 mg/dL (ref 8–23)
CO2: 24 mmol/L (ref 22–32)
Calcium: 7.3 mg/dL — ABNORMAL LOW (ref 8.9–10.3)
Chloride: 107 mmol/L (ref 98–111)
Creatinine, Ser: 0.68 mg/dL (ref 0.44–1.00)
GFR, Estimated: >60 mL/min
Glucose, Bld: 103 mg/dL — ABNORMAL HIGH (ref 70–99)
Potassium: 4.3 mmol/L (ref 3.5–5.1)
Sodium: 140 mmol/L (ref 135–145)

## 2024-11-11 LAB — CBC
HCT: 32.9 % — ABNORMAL LOW (ref 36.0–46.0)
Hemoglobin: 10.9 g/dL — ABNORMAL LOW (ref 12.0–15.0)
MCH: 31.6 pg (ref 26.0–34.0)
MCHC: 33.1 g/dL (ref 30.0–36.0)
MCV: 95.4 fL (ref 80.0–100.0)
Platelets: 363 K/uL (ref 150–400)
RBC: 3.45 MIL/uL — ABNORMAL LOW (ref 3.87–5.11)
RDW: 16.8 % — ABNORMAL HIGH (ref 11.5–15.5)
WBC: 6.1 K/uL (ref 4.0–10.5)
nRBC: 0 % (ref 0.0–0.2)

## 2024-11-11 MED ORDER — CALCIUM GLUCONATE-NACL 2-0.675 GM/100ML-% IV SOLN
2.0000 g | Freq: Once | INTRAVENOUS | Status: AC
  Administered 2024-11-11: 2000 mg via INTRAVENOUS
  Filled 2024-11-11: qty 100

## 2024-11-11 MED ORDER — DIATRIZOATE MEGLUMINE & SODIUM 66-10 % PO SOLN
90.0000 mL | Freq: Once | ORAL | Status: AC
  Administered 2024-11-11: 90 mL via NASOGASTRIC

## 2024-11-11 NOTE — Progress Notes (Signed)
 Patient ID: Anna Castaneda, female   DOB: 01-04-1946, 78 y.o.   MRN: [id number]     SURGICAL PROGRESS NOTE   Hospital Day(s): 2.   Interval History: Patient seen and examined, no acute events or new complaints overnight. Patient reports feeling sore but no severe abdominal pain.  She denies any issues overnight.  Vital signs in last 24 hours: [min-max] current  Temp:  [97.2 F (36.2 C)-98.3 F (36.8 C)] 98.3 F (36.8 C) (12/29 0416) Pulse Rate:  [70-83] 80 (12/29 0416) Resp:  [11-20] 20 (12/29 0416) BP: (101-147)/(49-73) 115/58 (12/29 0416) SpO2:  [94 %-100 %] 96 % (12/29 0416)     Height: 5' 4 (162.6 cm) Weight: 53.3 kg BMI (Calculated): 20.16   Physical Exam:  Constitutional: alert, cooperative and no distress  Respiratory: breathing non-labored at rest  Cardiovascular: regular rate and sinus rhythm  Gastrointestinal: soft, non-tender, and mildly-distended  Labs:     Latest Ref Rng & Units 11/11/2024    6:09 AM 11/10/2024    4:39 AM 11/09/2024    3:48 AM  CBC  WBC 4.0 - 10.5 K/uL 6.1  8.5  6.1   Hemoglobin 12.0 - 15.0 g/dL 89.0  86.8  86.6   Hematocrit 36.0 - 46.0 % 32.9  39.0  40.3   Platelets 150 - 400 K/uL 363  425  447       Latest Ref Rng & Units 11/11/2024    6:09 AM 11/10/2024    4:39 AM 11/09/2024    3:48 AM  CMP  Glucose 70 - 99 mg/dL 896  92  833   BUN 8 - 23 mg/dL 13  12  14    Creatinine 0.44 - 1.00 mg/dL 9.31  9.17  9.13   Sodium 135 - 145 mmol/L 140  138  129   Potassium 3.5 - 5.1 mmol/L 4.3  3.8  4.2   Chloride 98 - 111 mmol/L 107  101  94   CO2 22 - 32 mmol/L 24  25  22    Calcium  8.9 - 10.3 mg/dL 7.3  8.5  9.3   Total Protein 6.5 - 8.1 g/dL   7.2   Total Bilirubin 0.0 - 1.2 mg/dL   0.4   Alkaline Phos 38 - 126 U/L   46   AST 15 - 41 U/L   24   ALT 0 - 44 U/L   15     Imaging studies: No new pertinent imaging studies   Assessment/Plan:  78 y.o. female with large bowel obstruction 1 Day Post-Op s/p partial colectomy.  - Patient clinically  stable with adequate vital signs - Pain currently controlled - Will do Gastrografin  administration for further evaluation of GI transit - Discontinue Foley catheter - Encourage patient to ambulate   Lucas Petrin, MD

## 2024-11-11 NOTE — Progress Notes (Signed)
 " PROGRESS NOTE    Anna Castaneda  FMW:[id number] DOB: 05-Jan-1946 DOA: 11/09/2024 PCP: Sadie Manna, MD   Assessment & Plan:   Principal Problem:   SBO (small bowel obstruction) (HCC)  Assessment and Plan: Large bowel obstruction: as per gen surg. S/p partial colectomy 11/10/24 as per gen surg. Recent hemicolectomy in Nov 2025. Continue w/ NG tube. NPO. Gastrografin  study today as per gen surg. Gen surg following and recs apprec  Hypocalcemia: calcium  gluconate ordered    Elevated BP: w/o hx of HTN. BP is currently WNL. Hydralazine  prn    Hyponatremia: resolved   Lipase elevation: etiology unclear, possible secondary to SBO.   Thrombocytosis: WNL today      DVT prophylaxis: lovenox   Code Status: full  Family Communication:  Disposition Plan: likely d/c back home   Level of care: Telemetry  Status is: Inpatient Remains inpatient appropriate because: severity of illness    Consultants:  Gen surg   Procedures:   Antimicrobials:   Subjective: Pt c/o abd pain   Objective: Vitals:   11/10/24 1845 11/10/24 2308 11/11/24 0416 11/11/24 0818  BP: 132/62 (!) 101/49 (!) 115/58 (!) 122/57  Pulse: 81 73 80 (!) 55  Resp: 15 20 20 16   Temp: 97.7 F (36.5 C) (!) 97.4 F (36.3 C) 98.3 F (36.8 C) 97.8 F (36.6 C)  TempSrc:      SpO2: 98% 95% 96% 97%  Weight:      Height:        Intake/Output Summary (Last 24 hours) at 11/11/2024 0848 Last data filed at 11/11/2024 0526 Gross per 24 hour  Intake 1945 ml  Output 1075 ml  Net 870 ml   Filed Weights   11/09/24 0339  Weight: 53.3 kg    Examination:  General exam: appears comfortable  Respiratory system: clear breath sounds b/l  Cardiovascular system: S1/S2+. No rubs or clicks  Gastrointestinal system: Abd is soft, tenderness to palpation, hypoactive bowel sounds Central nervous system: alert & oriented. Moves all extremities  Psychiatry: judgement and insight appears at baseline. Appropriate mood and  affect    Data Reviewed: I have personally reviewed following labs and imaging studies  CBC: Recent Labs  Lab 11/09/24 0348 11/10/24 0439 11/11/24 0609  WBC 6.1 8.5 6.1  HGB 13.3 13.1 10.9*  HCT 40.3 39.0 32.9*  MCV 95.5 94.9 95.4  PLT 447* 425* 363   Basic Metabolic Panel: Recent Labs  Lab 11/09/24 0348 11/10/24 0439 11/11/24 0609  NA 129* 138 140  K 4.2 3.8 4.3  CL 94* 101 107  CO2 22 25 24   GLUCOSE 166* 92 103*  BUN 14 12 13   CREATININE 0.86 0.82 0.68  CALCIUM  9.3 8.5* 7.3*   GFR: Estimated Creatinine Clearance: 48.8 mL/min (by C-G formula based on SCr of 0.68 mg/dL). Liver Function Tests: Recent Labs  Lab 11/09/24 0348  AST 24  ALT 15  ALKPHOS 46  BILITOT 0.4  PROT 7.2  ALBUMIN 4.5   Recent Labs  Lab 11/09/24 0348  LIPASE 110*   No results for input(s): AMMONIA in the last 168 hours. Coagulation Profile: No results for input(s): INR, PROTIME in the last 168 hours. Cardiac Enzymes: No results for input(s): CKTOTAL, CKMB, CKMBINDEX, TROPONINI in the last 168 hours. BNP (last 3 results) No results for input(s): PROBNP in the last 8760 hours. HbA1C: No results for input(s): HGBA1C in the last 72 hours. CBG: No results for input(s): GLUCAP in the last 168 hours. Lipid Profile: No results for input(s):  CHOL, HDL, LDLCALC, TRIG, CHOLHDL, LDLDIRECT in the last 72 hours. Thyroid Function Tests: No results for input(s): TSH, T4TOTAL, FREET4, T3FREE, THYROIDAB in the last 72 hours. Anemia Panel: No results for input(s): VITAMINB12, FOLATE, FERRITIN, TIBC, IRON, RETICCTPCT in the last 72 hours. Sepsis Labs: Recent Labs  Lab 11/09/24 0348 11/09/24 0653  LATICACIDVEN 2.1* 0.9    No results found for this or any previous visit (from the past 240 hours).       Radiology Studies: DG Abd 2 Views Result Date: 11/10/2024 EXAM: 2 VIEW XRAY OF THE ABDOMEN 11/10/2024 07:42:00 AM COMPARISON: CT  abdomen and pelvis 11/09/2024. CLINICAL HISTORY: 78 year old female with bowel obstruction and hepatocellular carcinoma. FINDINGS: LINES, TUBES AND DEVICES: Stable and satisfactory enteric tube placement. BOWEL: Similar diffuse gaseous dilation of bowel loops. Lucency in the upper abdomen appears stable from the CT abdomen and pelvis scout view yesterday, with dilated loops but no convincing pneumoperitoneum. Gas distended bowel loops continue throughout the abdomen and pelvis. Mechanical bowel obstruction was favored by CT. SOFT TISSUES: Surgical sutures in the left abdomen. No abnormal calcifications. BONES: No acute fracture. IMPRESSION: 1. Ongoing extensive gas distended bowel loops throughout the abdomen and pelvis. No convincing pneumoperitoneum. Mechanical bowel obstruction favored on recent CT. 2. Stable Enteric tube. Electronically signed by: Helayne Hurst MD 11/10/2024 08:02 AM EST RP Workstation: [id number]        Scheduled Meds:  enoxaparin  (LOVENOX ) injection  40 mg Subcutaneous Q24H   pantoprazole  (PROTONIX ) IV  40 mg Intravenous Q24H   Continuous Infusions:  sodium chloride  100 mL/hr at 11/11/24 0800   calcium  gluconate       LOS: 2 days       Anthony CHRISTELLA Pouch, MD [facility] Pager [phone]  If 7PM-7AM, please contact night-coverage [email or web address] 11/11/2024, 8:48 AM   "

## 2024-11-12 ENCOUNTER — Inpatient Hospital Stay

## 2024-11-12 MED ORDER — SODIUM CHLORIDE 0.9 % IV SOLN: INTRAVENOUS | Status: DC

## 2024-11-12 NOTE — Progress Notes (Signed)
 [facility]- General Surgery  SURGICAL PROGRESS NOTE  Hospital Day(s): 3.   Post op day(s): 2 Days Post-Op.   Interval History:  Patient seen and examined. No acute events or new complaints overnight.  Contrast showed within colon on abdominal x-ray.  However no bowel movement or flatulence yet.  Patient overall appears comfortable during encounter. Patient noted Prevena VAC stopped working yesterday evening.  Nurses at bedside had attempted to troubleshoot device with no success.  Vital signs in last 24 hours: [min-max] current  Temp:  [98.1 F (36.7 C)-98.8 F (37.1 C)] 98.2 F (36.8 C) (12/30 0821) Pulse Rate:  [83-96] 83 (12/30 0821) Resp:  [16] 16 (12/30 0821) BP: (92-125)/(50-71) 115/50 (12/30 0821) SpO2:  [94 %-99 %] 99 % (12/30 0821)     Height: 5' 4 (162.6 cm) Weight: 53.3 kg BMI (Calculated): 20.16   Intake/Output last 2 shifts:  12/29 0701 - 12/30 0700 In: 90 [NG/GT:90] Out: 950 [Emesis/NG output:950]   Physical Exam:  Constitutional: alert, cooperative and no distress  Respiratory: breathing non-labored at rest  Cardiovascular: regular rate and sinus rhythm  Gastrointestinal: soft, non-tender, and non-distended, removed Prevena VAC.  Midline incision is clean and dry.  Skin staples are intact.  Labs:     Latest Ref Rng & Units 11/11/2024    6:09 AM 11/10/2024    4:39 AM 11/09/2024    3:48 AM  CBC  WBC 4.0 - 10.5 K/uL 6.1  8.5  6.1   Hemoglobin 12.0 - 15.0 g/dL 89.0  86.8  86.6   Hematocrit 36.0 - 46.0 % 32.9  39.0  40.3   Platelets 150 - 400 K/uL 363  425  447       Latest Ref Rng & Units 11/11/2024    6:09 AM 11/10/2024    4:39 AM 11/09/2024    3:48 AM  CMP  Glucose 70 - 99 mg/dL 896  92  833   BUN 8 - 23 mg/dL 13  12  14    Creatinine 0.44 - 1.00 mg/dL 9.31  9.17  9.13   Sodium 135 - 145 mmol/L 140  138  129   Potassium 3.5 - 5.1 mmol/L 4.3  3.8  4.2   Chloride 98 - 111 mmol/L 107  101  94   CO2 22 - 32 mmol/L 24  25  22    Calcium  8.9 - 10.3  mg/dL 7.3  8.5  9.3   Total Protein 6.5 - 8.1 g/dL   7.2   Total Bilirubin 0.0 - 1.2 mg/dL   0.4   Alkaline Phos 38 - 126 U/L   46   AST 15 - 41 U/L   24   ALT 0 - 44 U/L   15     Imaging studies: No new pertinent imaging studies   Assessment/Plan:  78 y.o. female with large bowel obstruction 2 Days Post-Op s/p partial colectomy.   - Stable vital signs, no fever not tachycardic, comfortable on exam  - Abdominal x-ray findings reassuring with contrast in colon.  Plan to clamp NG tube. Slightly tympanic and distended on exam. Continue NPO for now.  - Will start clear liquid diet once patient is clinically improving. - Encouraged to continue to ambulate  -- [employer] PA-C

## 2024-11-12 NOTE — Progress Notes (Signed)
 " PROGRESS NOTE   HPI was taken from Dr. Laurita: Anna Castaneda is a 78 y.o. female with medical history significant of recent thickened bolus status post right Hemi colectomy 6 weeks ago with recurrent SBO x 2 afterwards, HTN, HLD, TIA, presented with recurrent abdominal pain and distention.   Symptoms started last night, patient started to feel cramping like abdominal pain and distention.  She reported that she had a normal bowel movement yesterday morning.  Cramping-like pain became constant this morning and patient decided to come to the hospital.  Patient denied any chest pain shortness of breath, no fever or chills.   ED Course: Temperature 97.5 nontachycardic blood pressure 138/77 O2/99% on room air.  CT abdomen pelvis showed high-grade SBO.   NGT placed, patient was given multiple rounds of morphine  and Zofran .   Anna Castaneda  FMW:[id number] DOB: 1945/11/27 DOA: 11/09/2024 PCP: Sadie Manna, MD   Assessment & Plan:   Principal Problem:   SBO (small bowel obstruction) (HCC) Active Problems:   Large bowel obstruction (HCC)  Assessment and Plan: Large bowel obstruction: as per gen surg. S/p partial colectomy 11/10/24 as per gen surg. Recent hemicolectomy in Nov 2025. NG tube clamped today as per gen surg. NPO. Gen surg following and recs apprec  Hypocalcemia: will re-check in AM    Elevated BP: w/o hx of HTN. BP is WNL currently. Hydralazine  prn     Hyponatremia: resolved   Lipase elevation: etiology unclear, possible secondary to SBO.   Thrombocytosis: WNL today      DVT prophylaxis: lovenox   Code Status: full  Family Communication:  Disposition Plan: likely d/c back home   Level of care: Telemetry  Status is: Inpatient Remains inpatient appropriate because: severity of illness    Consultants:  Gen surg   Procedures:   Antimicrobials:   Subjective: Pt c/o being hungry   Objective: Vitals:   11/11/24 1722 11/11/24 1952 11/12/24 0253 11/12/24 0821   BP: 92/63 125/65 118/71 (!) 115/50  Pulse: 96 86 89 83  Resp: 16 16 16 16   Temp: 98.1 F (36.7 C) 98.3 F (36.8 C) 98.8 F (37.1 C) 98.2 F (36.8 C)  TempSrc:    Oral  SpO2: 94% 99% 97% 99%  Weight:      Height:        Intake/Output Summary (Last 24 hours) at 11/12/2024 0910 Last data filed at 11/12/2024 0330 Gross per 24 hour  Intake 60 ml  Output 950 ml  Net -890 ml   Filed Weights   11/09/24 0339  Weight: 53.3 kg    Examination:  General exam: appears agitated  Respiratory system: clear breath sounds b/l  Cardiovascular system: S1 & S2+. No rales or rubs  Gastrointestinal system: abd is soft, distended, tenderness to palpation, hypoactive bowel sounds  Central nervous system: alert & oriented. Moves all extremities  Psychiatry: judgement and insight appears at baseline. Agitated mood and affect    Data Reviewed: I have personally reviewed following labs and imaging studies  CBC: Recent Labs  Lab 11/09/24 0348 11/10/24 0439 11/11/24 0609  WBC 6.1 8.5 6.1  HGB 13.3 13.1 10.9*  HCT 40.3 39.0 32.9*  MCV 95.5 94.9 95.4  PLT 447* 425* 363   Basic Metabolic Panel: Recent Labs  Lab 11/09/24 0348 11/10/24 0439 11/11/24 0609  NA 129* 138 140  K 4.2 3.8 4.3  CL 94* 101 107  CO2 22 25 24   GLUCOSE 166* 92 103*  BUN 14 12 13   CREATININE 0.86 0.82  0.68  CALCIUM  9.3 8.5* 7.3*   GFR: Estimated Creatinine Clearance: 48.8 mL/min (by C-G formula based on SCr of 0.68 mg/dL). Liver Function Tests: Recent Labs  Lab 11/09/24 0348  AST 24  ALT 15  ALKPHOS 46  BILITOT 0.4  PROT 7.2  ALBUMIN 4.5   Recent Labs  Lab 11/09/24 0348  LIPASE 110*   No results for input(s): AMMONIA in the last 168 hours. Coagulation Profile: No results for input(s): INR, PROTIME in the last 168 hours. Cardiac Enzymes: No results for input(s): CKTOTAL, CKMB, CKMBINDEX, TROPONINI in the last 168 hours. BNP (last 3 results) No results for input(s): PROBNP in  the last 8760 hours. HbA1C: No results for input(s): HGBA1C in the last 72 hours. CBG: No results for input(s): GLUCAP in the last 168 hours. Lipid Profile: No results for input(s): CHOL, HDL, LDLCALC, TRIG, CHOLHDL, LDLDIRECT in the last 72 hours. Thyroid Function Tests: No results for input(s): TSH, T4TOTAL, FREET4, T3FREE, THYROIDAB in the last 72 hours. Anemia Panel: No results for input(s): VITAMINB12, FOLATE, FERRITIN, TIBC, IRON, RETICCTPCT in the last 72 hours. Sepsis Labs: Recent Labs  Lab 11/09/24 0348 11/09/24 0653  LATICACIDVEN 2.1* 0.9    No results found for this or any previous visit (from the past 240 hours).       Radiology Studies: DG Abd Portable 1V-Small Bowel Obstruction Protocol-initial, 8 hr delay Result Date: 11/12/2024 EXAM: 1 VIEW XRAY OF THE ABDOMEN 11/11/2024 04:23:00 PM COMPARISON: Abdominal series dated 11/10/2024. CLINICAL HISTORY: Since the previous study, the patient has undergone laparotomy. FINDINGS: LINES, TUBES AND DEVICES: An enteric catheter is present with its tip along the greater curvature of the mid stomach. An additional catheter now projects over the left mid abdomen. BOWEL: There is enteric contrast present within the rectosigmoid colon and descending colon. Nonobstructive bowel gas pattern. SOFT TISSUES: Moderate pneumoperitoneum. No abnormal calcifications. BONES: Mild levoscoliosis of the lumbar spine. No acute fracture. IMPRESSION: 1. Moderate pneumoperitoneum, likely related to interval laparotomy. 2. Enteric contrast within the descending colon and rectosigmoid colon. 3. Enteric catheter tip along the greater curvature of the mid stomach with an additional catheter projecting over the left mid abdomen. Electronically signed by: Evalene Coho MD 11/12/2024 07:24 AM EST RP Workstation: [id number]   DG Abd 2 Views Result Date: 11/12/2024 EXAM: 2 VIEW XRAY OF THE ABDOMEN 11/12/2024 07:15:00 AM  COMPARISON: KUB dated 11/11/2024. CLINICAL HISTORY: [id number] Ileus following gastrointestinal surgery (HCC) [id number] Ileus following gastrointestinal surgery (HCC) FINDINGS: LINES, TUBES AND DEVICES: An enteric catheter remains with its tip along the greater curvature of the stomach. The gastrostomy tube is also present. BOWEL: There is moderate pneumoperitoneum present, which is better demonstrated on the AP erect view. There is enteric contrast within the rectosigmoid colon. SOFT TISSUES: Skin staples are present just to the left of midline. No abnormal calcifications. BONES: No acute fracture. IMPRESSION: 1. Moderate pneumoperitoneum, better demonstrated on the AP erect view. Comparison: 11/11/2024. 2. Enteric catheter tip along the greater curvature of the stomach and gastrostomy tube in place. 3. Enteric contrast within the rectosigmoid colon. Electronically signed by: Evalene Coho MD 11/12/2024 07:21 AM EST RP Workstation: [id number]        Scheduled Meds:  enoxaparin  (LOVENOX ) injection  40 mg Subcutaneous Q24H   pantoprazole  (PROTONIX ) IV  40 mg Intravenous Q24H   Continuous Infusions:     LOS: 3 days       Anna CHRISTELLA Pouch, MD [facility] Pager [phone]  If 7PM-7AM, please contact night-coverage  [email or web address] 11/12/2024, 9:10 AM   "

## 2024-11-12 NOTE — Care Management Important Message (Signed)
 Important Message  Patient Details  Name: Anna Castaneda MRN: [id number] Date of Birth: 1945-12-31   Important Message Given:  Yes - Medicare IM     Rojelio SHAUNNA Rattler 11/12/2024, 2:01 PM

## 2024-11-12 NOTE — Progress Notes (Signed)
 Mobility Specialist Progress Note:    11/12/24 0856  Mobility  Activity Ambulated with assistance  Level of Assistance Standby assist, set-up cues, supervision of patient - no hands on  Assistive Device None  Distance Ambulated (ft) 510 ft  Range of Motion/Exercises Active;All extremities  Activity Response Tolerated well  Mobility visit 1 Mobility  Mobility Specialist Start Time (ACUTE ONLY) [id number]  Mobility Specialist Stop Time (ACUTE ONLY) 0854  Mobility Specialist Time Calculation (min) (ACUTE ONLY) 13 min   Pt received in chair, eager for mobility. Required supervision to stand and ambulate with no AD. Tolerated well, asx throughout. Returned to chair with belongings in reach and all needs met.  Sherrilee Ditty Mobility Specialist Please contact via Special Educational Needs Teacher or  Rehab office at [phone]

## 2024-11-12 NOTE — TOC CM/SW Note (Signed)
 Transition of Care [facility]) - Inpatient Brief Assessment   Patient Details  Name: Anna Castaneda MRN: [id number] Date of Birth: [birth year]  Transition of Care [facility]) CM/SW Contact:    Corean ONEIDA Haddock, RN Phone Number: 11/12/2024, 2:48 PM   Clinical Narrative:  Transition of Care Department [facility]) has reviewed patient and no TOC needs have been identified at this time.  If new patient transition needs arise, please place a TOC consult.   Transition of Care Asessment: Insurance and Status: Insurance coverage has been reviewed Patient has primary care physician: Yes     Prior/Current Home Services: No current home services Social Drivers of Health Review: SDOH reviewed no interventions necessary Readmission risk has been reviewed: Yes Transition of care needs: no transition of care needs at this time

## 2024-11-13 DIAGNOSIS — K56609 Unspecified intestinal obstruction, unspecified as to partial versus complete obstruction: Secondary | ICD-10-CM | POA: Diagnosis not present

## 2024-11-13 LAB — CBC
HCT: 23.4 % — ABNORMAL LOW (ref 36.0–46.0)
Hemoglobin: 7.6 g/dL — ABNORMAL LOW (ref 12.0–15.0)
MCH: 31.9 pg (ref 26.0–34.0)
MCHC: 32.5 g/dL (ref 30.0–36.0)
MCV: 98.3 fL (ref 80.0–100.0)
Platelets: 272 K/uL (ref 150–400)
RBC: 2.38 MIL/uL — ABNORMAL LOW (ref 3.87–5.11)
RDW: 17.2 % — ABNORMAL HIGH (ref 11.5–15.5)
WBC: 7.1 K/uL (ref 4.0–10.5)
nRBC: 0 % (ref 0.0–0.2)

## 2024-11-13 LAB — BASIC METABOLIC PANEL WITH GFR
Anion gap: 12 (ref 5–15)
BUN: 28 mg/dL — ABNORMAL HIGH (ref 8–23)
CO2: 22 mmol/L (ref 22–32)
Calcium: 7.6 mg/dL — ABNORMAL LOW (ref 8.9–10.3)
Chloride: 113 mmol/L — ABNORMAL HIGH (ref 98–111)
Creatinine, Ser: 0.66 mg/dL (ref 0.44–1.00)
GFR, Estimated: >60 mL/min
Glucose, Bld: 87 mg/dL (ref 70–99)
Potassium: 4.1 mmol/L (ref 3.5–5.1)
Sodium: 146 mmol/L — ABNORMAL HIGH (ref 135–145)

## 2024-11-13 LAB — SURGICAL PATHOLOGY

## 2024-11-13 LAB — HEMOGLOBIN: Hemoglobin: 7.7 g/dL — ABNORMAL LOW (ref 12.0–15.0)

## 2024-11-13 NOTE — Plan of Care (Signed)

## 2024-11-13 NOTE — Progress Notes (Signed)
 [facility]- General Surgery  SURGICAL PROGRESS NOTE  Hospital Day(s): 4.   Post op day(s): 3 Days Post-Op.   Interval History:  Patient seen and examined.  Reports having a bowel movement yesterday with more blood than stool. Hemoglobin levels dropped from 10.9 to 7.6. Denies any worsening abdominal discomfort. Overall appears comfortable during this encounter.  She is content to be working with PT.   Vital signs in last 24 hours: [min-max] current  Temp:  [97.8 F (36.6 C)-98.5 F (36.9 C)] 98.5 F (36.9 C) (12/31 0306) Pulse Rate:  [65-90] 90 (12/31 0306) Resp:  [15-18] 18 (12/31 0306) BP: (109-117)/(50-58) 117/53 (12/31 0306) SpO2:  [99 %-100 %] 100 % (12/31 0306)     Height: 5' 4 (162.6 cm) Weight: 53.3 kg BMI (Calculated): 20.16   Intake/Output last 2 shifts:  No intake/output data recorded.   Physical Exam:  Constitutional: alert, cooperative and no distress  Respiratory: breathing non-labored at rest  Cardiovascular: regular rate and sinus rhythm  Gastrointestinal: soft, non-tender, and mildly distended  Labs:     Latest Ref Rng & Units 11/13/2024    5:27 AM 11/11/2024    6:09 AM 11/10/2024    4:39 AM  CBC  WBC 4.0 - 10.5 K/uL 7.1  6.1  8.5   Hemoglobin 12.0 - 15.0 g/dL 7.6  89.0  86.8   Hematocrit 36.0 - 46.0 % 23.4  32.9  39.0   Platelets 150 - 400 K/uL 272  363  425       Latest Ref Rng & Units 11/13/2024    5:27 AM 11/11/2024    6:09 AM 11/10/2024    4:39 AM  CMP  Glucose 70 - 99 mg/dL 87  896  92   BUN 8 - 23 mg/dL 28  13  12    Creatinine 0.44 - 1.00 mg/dL 9.33  9.31  9.17   Sodium 135 - 145 mmol/L 146  140  138   Potassium 3.5 - 5.1 mmol/L 4.1  4.3  3.8   Chloride 98 - 111 mmol/L 113  107  101   CO2 22 - 32 mmol/L 22  24  25    Calcium  8.9 - 10.3 mg/dL 7.6  7.3  8.5     Imaging studies: No new pertinent imaging studies   Assessment/Plan:  78 y.o. female with large bowel obstruction 3 Days Post-Op s/p partial colectomy with primary  anastomosis.   - Overall stable, no fever not tachycardic.  No worsening pain on physical exam.  Abdomen soft, mildly distended.  - Plan to repeat hemoglobin levels around noon. If hemoglobin levels continues to trend down, plan to transfuse 2 units of blood.  - Plan start clear liquid diet.  Will advance as she continues to have GI function. Discontinue IV fluids  - Will keep NG tube clamped for now.  - Encouraged to continue  -- [employer]

## 2024-11-13 NOTE — Progress Notes (Signed)
 " Progress Note   Patient: Anna Castaneda FMW:[id number] DOB: 1946/10/05 DOA: 11/09/2024     4 DOS: the patient was seen and examined on 11/13/2024   Brief hospital course:  From HPI Anna Castaneda is a 78 y.o. female with medical history significant of recent thickened bolus status post right Hemi colectomy 6 weeks ago with recurrent SBO x 2 afterwards, HTN, HLD, TIA, presented with recurrent abdominal pain and distention.   Symptoms started last night, patient started to feel cramping like abdominal pain and distention.  She reported that she had a normal bowel movement yesterday morning.  Cramping-like pain became constant this morning and patient decided to come to the hospital.  Patient denied any chest pain shortness of breath, no fever or chills.   ED Course: Temperature 97.5 nontachycardic blood pressure 138/77 O2/99% on room air.  CT abdomen pelvis showed high-grade SBO.   NGT placed, patient was given multiple rounds of morphine  and Zofran .        Assessment and Plan: Large bowel obstruction: as per gen surg. S/p partial colectomy 11/10/24 as per gen surg. Recent hemicolectomy in Nov 2025.  NG tube still in place being managed by surgeon Still n.p.o. Surgeon on board and case discussed   Hypocalcemia:-Improved Continue monitoring     Hyponatremia: resolved   Lipase elevation: etiology unclear, possible secondary to SBO.    Thrombocytosis: WNL today    Anemia Hemoglobin today 7.6 repeat of 7.7 Continue to monitor closely Did have some minimal dark stool today We will engage GI if this persist I doubt endoscopic intervention cannot be done at this time given ongoing bowel obstruction     DVT prophylaxis: lovenox   Code Status: full  Family Communication:  Disposition Plan: likely d/c back home    Level of care: Telemetry   Status is: Inpatient Remains inpatient appropriate because: severity of illness    Subjective:  Patient seen and examined at bedside this  morning Did have some abdominal pain but improving Denies nausea vomiting Did have downtrending of hemoglobin  Physical Exam:  General exam: appears agitated  Respiratory system: clear breath sounds b/l  Cardiovascular system: S1 & S2+. No rales or rubs  Gastrointestinal system: abd is soft, distended, tenderness to palpation, hypoactive bowel sounds  Central nervous system: alert & oriented. Moves all extremities  Psychiatry: judgement and insight appears at baseline. Agitated mood and affect       Data Reviewed    Latest Ref Rng & Units 11/13/2024    5:27 AM 11/11/2024    6:09 AM 11/10/2024    4:39 AM  BMP  Glucose 70 - 99 mg/dL 87  896  92   BUN 8 - 23 mg/dL 28  13  12    Creatinine 0.44 - 1.00 mg/dL 9.33  9.31  9.17   Sodium 135 - 145 mmol/L 146  140  138   Potassium 3.5 - 5.1 mmol/L 4.1  4.3  3.8   Chloride 98 - 111 mmol/L 113  107  101   CO2 22 - 32 mmol/L 22  24  25    Calcium  8.9 - 10.3 mg/dL 7.6  7.3  8.5        Latest Ref Rng & Units 11/13/2024   11:32 AM 11/13/2024    5:27 AM 11/11/2024    6:09 AM  CBC  WBC 4.0 - 10.5 K/uL  7.1  6.1   Hemoglobin 12.0 - 15.0 g/dL 7.7  7.6  89.0   Hematocrit 36.0 - 46.0 %  23.4  32.9   Platelets 150 - 400 K/uL  272  363      Vitals:   11/12/24 2019 11/13/24 0306 11/13/24 0747 11/13/24 1559  BP: (!) 109/58 (!) 117/53 (!) 113/55 (!) 103/57  Pulse: 65 90 83 90  Resp: 17 18 16 16   Temp: 97.8 F (36.6 C) 98.5 F (36.9 C) 98 F (36.7 C) 97.8 F (36.6 C)  TempSrc: Oral Oral    SpO2:  100% 100% 100%  Weight:      Height:         Author: Drue ONEIDA Potter, MD 11/13/2024 5:16 PM  For on call review [email or web address]  "

## 2024-11-13 NOTE — Progress Notes (Signed)
 Initial Nutrition Assessment  DOCUMENTATION CODES:   Not applicable  INTERVENTION:   RD will add supplements with diet advancement   Recommend TPN if unable to advance pt's diet within the next 24 hrs    Recommend thiamine 100mg  IV daily x 5-7 days   Pt at high refeed risk; recommend monitor potassium, magnesium and phosphorus labs daily until stable  Daily weights   NUTRITION DIAGNOSIS:   Inadequate oral intake related to altered GI function as evidenced by NPO status.  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  RD working remotely.  Pt is well known to this RD from a recent previous admission.  Pt reports a very regimented meal regimen. Pt reports that she eats 1400kcal/day and specifically mentions how much carbohydrate, fiber, protein and fat she aims for daily. Pt reports that she is extremely active and walks 1-2 miles per day. Pt does not drink any nutritional supplements as she reports that she tries to meet her needs with natural foods.  Pt is agreeable to the chocolate Mallie Pinion in hospital. Pt has remained NPO since admission and is now without nutrition for 5 days. RD will add supplements with diet advancement. Recommend TPN if unable to advance pt's diet within the next 24 hrs; this was discussed with medical team. Pt is at high refeed risk. Per chart, pt appears weight stable from her last admission if her admit weight is correct.    Medications reviewed and  include: lovenox , protonix   Labs reviewed: Na 146(H), K 4.1 wnl, BUN 28(H) Hgb 7.7(L), Hct 23.4(L)   NUTRITION - FOCUSED PHYSICAL EXAM: Unable to perform at this time   Diet Order:   Diet Order             Diet NPO time specified Except for: Sips with Meds  Diet effective now                  EDUCATION NEEDS:   No education needs have been identified at this time  Skin:  Skin Assessment: Reviewed RN Assessment (incision abdomen)  Last BM:  12/30- type 7  Height:   Ht Readings from Last 1 Encounters:  11/09/24 5' 4 (1.626 m)    Weight:   Wt Readings from Last 1 Encounters:  11/09/24 53.3 kg    Ideal Body Weight:  54.5 kg  BMI:  Body mass index is 20.17 kg/m.  Estimated Nutritional Needs:   Kcal:  1400-1600kcal/day  Protein:  70-80g/day  Fluid:  1.4-1.6L/day  Augustin Shams MS, RD, LDN If unable to be reached, please send secure chat to RD inpatient available from 8:00a-4:00p daily

## 2024-11-14 DIAGNOSIS — K56609 Unspecified intestinal obstruction, unspecified as to partial versus complete obstruction: Secondary | ICD-10-CM | POA: Diagnosis not present

## 2024-11-14 LAB — CBC WITH DIFFERENTIAL/PLATELET
Abs Immature Granulocytes: 0.02 K/uL (ref 0.00–0.07)
Basophils Absolute: 0 K/uL (ref 0.0–0.1)
Basophils Relative: 0 %
Eosinophils Absolute: 0.1 K/uL (ref 0.0–0.5)
Eosinophils Relative: 2 %
HCT: 24.6 % — ABNORMAL LOW (ref 36.0–46.0)
Hemoglobin: 7.9 g/dL — ABNORMAL LOW (ref 12.0–15.0)
Immature Granulocytes: 0 %
Lymphocytes Relative: 12 %
Lymphs Abs: 0.8 K/uL (ref 0.7–4.0)
MCH: 31.3 pg (ref 26.0–34.0)
MCHC: 32.1 g/dL (ref 30.0–36.0)
MCV: 97.6 fL (ref 80.0–100.0)
Monocytes Absolute: 0.4 K/uL (ref 0.1–1.0)
Monocytes Relative: 6 %
Neutro Abs: 5.5 K/uL (ref 1.7–7.7)
Neutrophils Relative %: 80 %
Platelets: 345 K/uL (ref 150–400)
RBC: 2.52 MIL/uL — ABNORMAL LOW (ref 3.87–5.11)
RDW: 16.8 % — ABNORMAL HIGH (ref 11.5–15.5)
WBC: 6.9 K/uL (ref 4.0–10.5)
nRBC: 0 % (ref 0.0–0.2)

## 2024-11-14 LAB — BASIC METABOLIC PANEL WITH GFR
Anion gap: 10 (ref 5–15)
BUN: 29 mg/dL — ABNORMAL HIGH (ref 8–23)
CO2: 25 mmol/L (ref 22–32)
Calcium: 7.7 mg/dL — ABNORMAL LOW (ref 8.9–10.3)
Chloride: 112 mmol/L — ABNORMAL HIGH (ref 98–111)
Creatinine, Ser: 0.74 mg/dL (ref 0.44–1.00)
GFR, Estimated: >60 mL/min
Glucose, Bld: 130 mg/dL — ABNORMAL HIGH (ref 70–99)
Potassium: 3.6 mmol/L (ref 3.5–5.1)
Sodium: 147 mmol/L — ABNORMAL HIGH (ref 135–145)

## 2024-11-14 NOTE — Progress Notes (Signed)
 " Progress Note   Patient: Anna Castaneda FMW:[id number] DOB: [birth year] DOA: 11/09/2024     5 DOS: the patient was seen and examined on 11/14/2024     Brief hospital course:   From HPI Anna Castaneda is a 79 y.o. female with medical history significant of recent thickened bolus status post right Hemi colectomy 6 weeks ago with recurrent SBO x 2 afterwards, HTN, HLD, TIA, presented with recurrent abdominal pain and distention.   Symptoms started last night, patient started to feel cramping like abdominal pain and distention.  She reported that she had a normal bowel movement yesterday morning.  Cramping-like pain became constant this morning and patient decided to come to the hospital.  Patient denied any chest pain shortness of breath, no fever or chills.   ED Course: Temperature 97.5 nontachycardic blood pressure 138/77 O2/99% on room air.  CT abdomen pelvis showed high-grade SBO.   NGT placed, patient was given multiple rounds of morphine  and Zofran .        Assessment and Plan: Large bowel obstruction: as per gen surg. S/p partial colectomy 11/10/24 as per gen surg. Recent hemicolectomy in Nov 2025.  NG tube still in place being managed by surgeon Still n.p.o. Surgeon on board and case discussed   Hypocalcemia:-Improved Continue monitoring     Hyponatremia: resolved   Lipase elevation: etiology unclear, possible secondary to SBO.    Thrombocytosis: WNL today    Anemia Hemoglobin stable No evidence of GI bleed noted We will engage GI if this persist I doubt endoscopic intervention cannot be done at this time given ongoing bowel obstruction     DVT prophylaxis: lovenox   Code Status: full  Family Communication:  Disposition Plan: likely d/c back home    Level of care: Telemetry   Status is: Inpatient Remains inpatient appropriate because: severity of illness     Subjective:  Patient seen and examined at bedside this morning Denies worsening abdominal pain chest pain  or cough   Physical Exam:   General exam: appears agitated  Respiratory system: clear breath sounds b/l  Cardiovascular system: S1 & S2+. No rales or rubs  Gastrointestinal system: abd is soft, distended, tenderness to palpation, hypoactive bowel sounds  Central nervous system: alert & oriented. Moves all extremities  Psychiatry: judgement and insight appears at baseline. Agitated mood and affect       Data Reviewed     Latest Ref Rng & Units 11/14/2024    4:42 AM 11/13/2024    5:27 AM 11/11/2024    6:09 AM  BMP  Glucose 70 - 99 mg/dL 869  87  896   BUN 8 - 23 mg/dL 29  28  13    Creatinine 0.44 - 1.00 mg/dL 9.25  9.33  9.31   Sodium 135 - 145 mmol/L 147  146  140   Potassium 3.5 - 5.1 mmol/L 3.6  4.1  4.3   Chloride 98 - 111 mmol/L 112  113  107   CO2 22 - 32 mmol/L 25  22  24    Calcium  8.9 - 10.3 mg/dL 7.7  7.6  7.3     Vitals:   11/13/24 1559 11/13/24 2031 11/14/24 0500 11/14/24 0648  BP: (!) 103/57 114/62  127/77  Pulse: 90 84  (!) 102  Resp: 16 16  18   Temp: 97.8 F (36.6 C) 98.3 F (36.8 C)  98.7 F (37.1 C)  TempSrc:  Oral  Oral  SpO2: 100% 100%  94%  Weight:   46.8 kg  Height:          Latest Ref Rng & Units 11/14/2024    4:42 AM 11/13/2024   11:32 AM 11/13/2024    5:27 AM  CBC  WBC 4.0 - 10.5 K/uL 6.9   7.1   Hemoglobin 12.0 - 15.0 g/dL 7.9  7.7  7.6   Hematocrit 36.0 - 46.0 % 24.6   23.4   Platelets 150 - 400 K/uL 345   272      Author: Drue ONEIDA Potter, MD 11/14/2024 2:06 PM  For on call review [email or web address]  "

## 2024-11-14 NOTE — Progress Notes (Signed)
 Patient ID: Anna Castaneda, female   DOB: 1946-03-06, 79 y.o.   MRN: [id number]     SURGICAL PROGRESS NOTE   Hospital Day(s): 5.   Interval History: Patient seen and examined, no acute events or new complaints overnight. Patient reports feeling OK.  Patient denies any specific changes.  She denies abdominal pain.  She still feel mildly distended and passed a small gas.  She denies any nausea or vomiting.  She did have some nausea yesterday afternoon and was placed back to the nasogastric tube suction.  Vital signs in last 24 hours: [min-max] current  Temp:  [97.8 F (36.6 C)-98.7 F (37.1 C)] 98.7 F (37.1 C) (01/01 0648) Pulse Rate:  [84-102] 102 (01/01 0648) Resp:  [16-18] 18 (01/01 0648) BP: (103-127)/(57-77) 127/77 (01/01 0648) SpO2:  [94 %-100 %] 94 % (01/01 0648) Weight:  [46.8 kg] 46.8 kg (01/01 0500)     Height: 5' 4 (162.6 cm) Weight: 46.8 kg BMI (Calculated): 17.71   Physical Exam:  Constitutional: alert, cooperative and no distress  Respiratory: breathing non-labored at rest  Cardiovascular: regular rate and sinus rhythm  Gastrointestinal: soft, non-tender, and distended  Labs:     Latest Ref Rng & Units 11/14/2024    4:42 AM 11/13/2024   11:32 AM 11/13/2024    5:27 AM  CBC  WBC 4.0 - 10.5 K/uL 6.9   7.1   Hemoglobin 12.0 - 15.0 g/dL 7.9  7.7  7.6   Hematocrit 36.0 - 46.0 % 24.6   23.4   Platelets 150 - 400 K/uL 345   272       Latest Ref Rng & Units 11/14/2024    4:42 AM 11/13/2024    5:27 AM 11/11/2024    6:09 AM  CMP  Glucose 70 - 99 mg/dL 869  87  896   BUN 8 - 23 mg/dL 29  28  13    Creatinine 0.44 - 1.00 mg/dL 9.25  9.33  9.31   Sodium 135 - 145 mmol/L 147  146  140   Potassium 3.5 - 5.1 mmol/L 3.6  4.1  4.3   Chloride 98 - 111 mmol/L 112  113  107   CO2 22 - 32 mmol/L 25  22  24    Calcium  8.9 - 10.3 mg/dL 7.7  7.6  7.3     Imaging studies: No new pertinent imaging studies   Assessment/Plan:  Large bowel obstruction - S/p partial colectomy -  Recovering slowly but adequately - No significant GI function yet - She had a little small dark blood passing rectally but hemoglobin continuing slowly increasing trend now 7.9 - Patient is proposing to let her drink Ensure with the NGT clamp as she feels that that usually activated her intestine to go to the bathroom.  I agree with her to give a trial to the Ensure with NGT clamp.  If she feel nauseated she will be placed back to suction. - Encouraged the patient to ambulate   Lucas Petrin, MD

## 2024-11-14 NOTE — Plan of Care (Signed)
  Problem: Education: Goal: Knowledge of General Education information will improve Description: Including pain rating scale, medication(s)/side effects and non-pharmacologic comfort measures Outcome: Progressing   Problem: Health Behavior/Discharge Planning: Goal: Ability to manage health-related needs will improve Outcome: Progressing   Problem: Clinical Measurements: Goal: Ability to maintain clinical measurements within normal limits will improve Outcome: Progressing Goal: Will remain free from infection Outcome: Progressing Goal: Diagnostic test results will improve Outcome: Progressing Goal: Respiratory complications will improve Outcome: Progressing Goal: Cardiovascular complication will be avoided Outcome: Progressing   Problem: Activity: Goal: Risk for activity intolerance will decrease Outcome: Progressing   Problem: Nutrition: Goal: Adequate nutrition will be maintained Outcome: Progressing   Problem: Pain Managment: Goal: General experience of comfort will improve and/or be controlled Outcome: Progressing   Problem: Safety: Goal: Ability to remain free from injury will improve Outcome: Progressing

## 2024-11-14 NOTE — Plan of Care (Signed)
  Problem: Clinical Measurements: Goal: Ability to maintain clinical measurements within normal limits will improve Outcome: Progressing   Problem: Activity: Goal: Risk for activity intolerance will decrease Outcome: Progressing   Problem: Pain Managment: Goal: General experience of comfort will improve and/or be controlled Outcome: Progressing   Problem: Safety: Goal: Ability to remain free from injury will improve Outcome: Progressing   Problem: Skin Integrity: Goal: Risk for impaired skin integrity will decrease Outcome: Progressing

## 2024-11-14 DEATH — deceased

## 2024-11-15 ENCOUNTER — Encounter: Payer: Self-pay | Admitting: Internal Medicine

## 2024-11-15 ENCOUNTER — Encounter: Admission: EM | Disposition: E | Payer: Self-pay | Source: Home / Self Care | Attending: Internal Medicine

## 2024-11-15 ENCOUNTER — Inpatient Hospital Stay: Admitting: Anesthesiology

## 2024-11-15 ENCOUNTER — Inpatient Hospital Stay

## 2024-11-15 DIAGNOSIS — K56609 Unspecified intestinal obstruction, unspecified as to partial versus complete obstruction: Secondary | ICD-10-CM | POA: Diagnosis not present

## 2024-11-15 HISTORY — PX: LAPAROTOMY: SHX154

## 2024-11-15 LAB — TROPONIN T, HIGH SENSITIVITY: Troponin T High Sensitivity: 21 ng/L — ABNORMAL HIGH (ref 0–19)

## 2024-11-15 LAB — HEMOGLOBIN AND HEMATOCRIT, BLOOD
HCT: 27 % — ABNORMAL LOW (ref 36.0–46.0)
Hemoglobin: 8.4 g/dL — ABNORMAL LOW (ref 12.0–15.0)

## 2024-11-15 LAB — GLUCOSE, CAPILLARY
Glucose-Capillary: 183 mg/dL — ABNORMAL HIGH (ref 70–99)
Glucose-Capillary: 228 mg/dL — ABNORMAL HIGH (ref 70–99)

## 2024-11-15 LAB — HEMOGLOBIN: Hemoglobin: 7.4 g/dL — ABNORMAL LOW (ref 12.0–15.0)

## 2024-11-15 MED ORDER — SUCCINYLCHOLINE CHLORIDE 200 MG/10ML IV SOSY
PREFILLED_SYRINGE | INTRAVENOUS | Status: DC | PRN
  Administered 2024-11-15: 50 mg via INTRAVENOUS

## 2024-11-15 MED ORDER — PHENYLEPHRINE 80 MCG/ML (10ML) SYRINGE FOR IV PUSH (FOR BLOOD PRESSURE SUPPORT)
PREFILLED_SYRINGE | INTRAVENOUS | Status: DC | PRN
  Administered 2024-11-15: 80 ug via INTRAVENOUS
  Administered 2024-11-15 (×2): 160 ug via INTRAVENOUS

## 2024-11-15 MED ORDER — BUPIVACAINE-EPINEPHRINE (PF) 0.5% -1:200000 IJ SOLN
INTRAMUSCULAR | Status: AC
  Filled 2024-11-15: qty 30

## 2024-11-15 MED ORDER — CEFAZOLIN SODIUM-DEXTROSE 2-4 GM/100ML-% IV SOLN
INTRAVENOUS | Status: AC
  Filled 2024-11-15: qty 100

## 2024-11-15 MED ORDER — LACTATED RINGERS IV SOLN: INTRAVENOUS | Status: DC | PRN

## 2024-11-15 MED ORDER — SODIUM CHLORIDE 0.9 % IV SOLN
250.0000 mL | INTRAVENOUS | Status: AC
  Administered 2024-11-16: 250 mL via INTRAVENOUS

## 2024-11-15 MED ORDER — ETOMIDATE 2 MG/ML IV SOLN
INTRAVENOUS | Status: DC | PRN
  Administered 2024-11-15 (×2): 4 mg via INTRAVENOUS

## 2024-11-15 MED ORDER — MIDAZOLAM HCL (PF) 2 MG/2ML IJ SOLN
INTRAMUSCULAR | Status: DC | PRN
  Administered 2024-11-15: 2 mg via INTRAVENOUS

## 2024-11-15 MED ORDER — DEXAMETHASONE SOD PHOSPHATE PF 10 MG/ML IJ SOLN
INTRAMUSCULAR | Status: DC | PRN
  Administered 2024-11-15: 5 mg via INTRAVENOUS

## 2024-11-15 MED ORDER — LIDOCAINE HCL (CARDIAC) PF 100 MG/5ML IV SOSY
PREFILLED_SYRINGE | INTRAVENOUS | Status: DC | PRN
  Administered 2024-11-15: 50 mg via INTRAVENOUS

## 2024-11-15 MED ORDER — ROCURONIUM BROMIDE 100 MG/10ML IV SOLN
INTRAVENOUS | Status: DC | PRN
  Administered 2024-11-15: 40 mg via INTRAVENOUS
  Administered 2024-11-16 (×2): 20 mg via INTRAVENOUS

## 2024-11-15 MED ORDER — CEFAZOLIN SODIUM-DEXTROSE 2-4 GM/100ML-% IV SOLN
2.0000 g | Freq: Once | INTRAVENOUS | Status: AC
  Administered 2024-11-15: 2 g via INTRAVENOUS
  Filled 2024-11-15: qty 100

## 2024-11-15 MED ORDER — MIDAZOLAM HCL 2 MG/2ML IJ SOLN
INTRAMUSCULAR | Status: AC
  Filled 2024-11-15: qty 2

## 2024-11-15 MED ORDER — NOREPINEPHRINE 4 MG/250ML-% IV SOLN
INTRAVENOUS | Status: AC
  Administered 2024-11-15: 2 ug/min via INTRAVENOUS
  Filled 2024-11-15: qty 250

## 2024-11-15 MED ORDER — KATE FARMS STANDARD 1.4 PO LIQD
325.0000 mL | Freq: Two times a day (BID) | ORAL | Status: DC
  Administered 2024-11-15: 325 mL via ORAL
  Filled 2024-11-15: qty 325

## 2024-11-15 MED ORDER — VASOPRESSIN 20 UNIT/ML IV SOLN
INTRAVENOUS | Status: DC | PRN
  Administered 2024-11-15: 2 [IU] via INTRAVENOUS
  Administered 2024-11-15 (×3): 1 [IU] via INTRAVENOUS
  Administered 2024-11-16 (×3): 2 [IU] via INTRAVENOUS

## 2024-11-15 MED ORDER — SODIUM CHLORIDE (PF) 0.9 % IJ SOLN
INTRAMUSCULAR | Status: AC
  Filled 2024-11-15: qty 10

## 2024-11-15 MED ORDER — SODIUM CHLORIDE 0.9% IV SOLUTION: Freq: Once | INTRAVENOUS | Status: AC

## 2024-11-15 MED ORDER — PROPOFOL 10 MG/ML IV BOLUS
INTRAVENOUS | Status: AC
  Filled 2024-11-15: qty 20

## 2024-11-15 MED ORDER — NOREPINEPHRINE 16 MG/250ML-% IV SOLN
0.0000 ug/min | INTRAVENOUS | Status: DC
  Administered 2024-11-16: 5 ug/min via INTRAVENOUS
  Administered 2024-11-16: 26 ug/min via INTRAVENOUS
  Administered 2024-11-17: 2 ug/min via INTRAVENOUS
  Filled 2024-11-15 (×4): qty 250

## 2024-11-15 MED ORDER — FENTANYL CITRATE (PF) 100 MCG/2ML IJ SOLN
INTRAMUSCULAR | Status: AC
  Filled 2024-11-15: qty 2

## 2024-11-15 MED ORDER — BUPIVACAINE LIPOSOME 1.3 % IJ SUSP
INTRAMUSCULAR | Status: AC
  Filled 2024-11-15: qty 20

## 2024-11-15 MED ORDER — SODIUM CHLORIDE (PF) 0.9 % IJ SOLN
INTRAMUSCULAR | Status: AC
  Filled 2024-11-15: qty 20

## 2024-11-15 MED ORDER — SODIUM CHLORIDE 0.9 % IV SOLN: Freq: Once | INTRAVENOUS | Status: AC

## 2024-11-15 MED ORDER — CHLORHEXIDINE GLUCONATE CLOTH 2 % EX PADS
6.0000 | MEDICATED_PAD | Freq: Every day | CUTANEOUS | Status: DC
  Administered 2024-11-15 – 2024-11-17 (×3): 6 via TOPICAL

## 2024-11-15 MED ORDER — IOHEXOL 350 MG/ML SOLN
100.0000 mL | Freq: Once | INTRAVENOUS | Status: AC | PRN
  Administered 2024-11-15: 100 mL via INTRAVENOUS

## 2024-11-15 MED ORDER — LACTATED RINGERS IV BOLUS
1000.0000 mL | Freq: Once | INTRAVENOUS | Status: AC
  Administered 2024-11-15: 1000 mL via INTRAVENOUS

## 2024-11-15 MED ORDER — NOREPINEPHRINE 4 MG/250ML-% IV SOLN: 0.0000 ug/min | INTRAVENOUS | Status: DC

## 2024-11-15 NOTE — Procedures (Signed)
 Central line  Performed by: Rodolph Romano, MD Authorized by: Rodolph Romano, MD   Consent:    Consent obtained:  Verbal   Consent given by:  Patient   Risks, benefits, and alternatives were discussed: yes     Risks discussed:  Arterial puncture, incorrect placement, nerve damage, bleeding and infection   Alternatives discussed:  Delayed treatment Universal protocol:    Procedure explained and questions answered to patient or proxy's satisfaction: yes     Relevant documents present and verified: yes     Test results available: yes     Imaging studies available: yes     Required blood products, implants, devices, and special equipment available: yes     Site/side marked: yes     Immediately prior to procedure, a time out was called: yes     Patient identity confirmed:  Verbally with patient, arm band and hospital-assigned identification number Pre-procedure details:    Indication(s): central venous access and hemodynamic monitoring     Hand hygiene: Hand hygiene performed prior to insertion     Sterile barrier technique: All elements of maximal sterile technique followed     Skin preparation:  Chlorhexidine    Skin preparation agent: Skin preparation agent completely dried prior to procedure   Sedation:    Sedation type:  None Anesthesia:    Anesthesia method:  Local infiltration   Local anesthetic:  Lidocaine  1% w/o epi Procedure details:    Location:  R femoral   Patient position:  Trendelenburg   Procedural supplies:  Triple lumen   Landmarks identified: yes     Ultrasound guidance: yes     Ultrasound guidance timing: real time     Sterile ultrasound techniques: Sterile gel and sterile probe covers were used     Number of attempts:  1   Successful placement: yes   Post-procedure details:    Post-procedure:  Dressing applied and line sutured   Assessment:  Blood return through all ports and free fluid flow   Procedure completion:  Tolerated well, no immediate  complications

## 2024-11-15 NOTE — Progress Notes (Signed)
 "       SIGNIFICANT EVENT NOTE  Includes rapid response, CODE BLUE, acute worsening of clinical condition, need for bedside evaluation/urgent imaging, labs, consults, therapeutics  Event: Rapid response, overhead  NAME: Anna Castaneda MRN: [id number] DOB : [birth year]    Reason for event   Near syncope after having a bowel movement that was dark black blood pressure 81/55.  Improving with fluid bolus and after laying back in bed.  No fall or LOC       Past medical history context  -S/p exploratory laparotomy with partial colectomy 11/10/2028 indicated for large bowel obstruction. Since surgery she has had a distended abdomen and has not passed gas or stool. -Pertinent medical history: Nonrheumatic aortic valve insufficiency, hypertension, anxiety, history of cardiac catheterization with normal coronary arteries in 2017, dyslipidemia.,  Last evaluated by cardiology in October 2025 and given a 1 year follow-up,    Sequence of events Patient was at baseline, she was helped to the bathroom and she felt like she needed to have a BM.  BM was dark blood-she felt a gush.  Had a near syncopal event without fall.  Helped back to bedside where she gradually recovered  Initial patient rapid assessment Upon my arrival to the bedside, rapid response team evaluating.  Surgeon, Dr. Cesar also at bedside talking to patient. Patient very frail-appearing, but awake and able to respond to questions appropriately in a whisper due to weakness.  ROS: Complains of epigastric pain without nausea.  Responds no to questions about chest pain, shortness of breath, headache blurred vision.  Denies pain in the legs ate/Time Temp Pulse Resp BP SpO2 O2 Device  11/15/24 20:08:33 -- 95 -- 81/55 Abnormal  94 % --  11/15/24 20:03:46 -- 102 Abnormal  -- 99/60 94 % --  11/15/24 19:22:46 -- 99 -- 110/72 -- --  11/15/24 15:53:19 98.6 F (37 C) 76 17 138/64 98 % Room Air    Physical Exam Constitutional:      Appearance:  She is underweight.     Comments: Very frail-appearing elderly female.  HENT:     Nose:     Comments: NG tube draining copious amounts of thick greenish-yellow fluid Cardiovascular:     Rate and Rhythm: Tachycardia present.  Pulmonary:     Comments: Conversational dyspnea but lungs clear on auscultation Abdominal:     Comments: Abdomen very distended.  Staples still in place.  Refers to epigastrium as site of discomfort which she states is starting to improve  Musculoskeletal:     Right lower leg: 2+ Edema present.     Left lower leg: 2+ Edema present.  Neurological:     General: No focal deficit present.     Mental Status: Mental status is at baseline.      Initial diagnostic considerations/ plan/interventions   Diagnostic Workup    Latest Ref Rng & Units 11/15/2024    7:21 PM 11/15/2024    8:11 AM 11/14/2024    4:42 AM  CBC  WBC 4.0 - 10.5 K/uL   6.9   Hemoglobin 12.0 - 15.0 g/dL 8.4  7.4  7.9   Hematocrit 36.0 - 46.0 % 27.0   24.6   Platelets 150 - 400 K/uL   345    TnT: 21  EKG:  Sinus tachycardia   Response to interventions Patient improved following a fluid bolus  Final assessment     Assessment:  -Near syncope, possible vasovagal event versus acute blood loss - Bloody bowel movement, concern  for postop complication in the setting of partial colectomy 11/10/2024  Plan: Discussed in detail with Dr. Cesar, initially on the phone and then at bedside Plan to transfer patient to stepdown for closer monitoring overnight-please refer to surgical note Continue close cardiopulmonary monitoring EKG and troponin not consistent with ACS -Will continue to monitor       CRITICAL CARE Performed by: Delayne LULLA Solian   Total critical care time: 60 minutes  Critical care time was exclusive of separately billable procedures and treating other patients.  Critical care was necessary to treat or prevent imminent or life-threatening deterioration.  Critical care was time spent  personally by me on the following activities: development of treatment plan with patient and/or surrogate as well as nursing, discussions with consultants, evaluation of patient's response to treatment, examination of patient, obtaining history from patient or surrogate, ordering and performing treatments and interventions, ordering and review of laboratory studies, ordering and review of radiographic studies, pulse oximetry and re-evaluation of patient's condition.         "

## 2024-11-15 NOTE — Progress Notes (Signed)
 Nutrition Follow Up Note   DOCUMENTATION CODES:   Not applicable  INTERVENTION:   Recommend TPN initiation as pt without adequate nutrition for 7 days   Recommend thiamine 100mg  IV daily x 5-7 days   Pt is at high refeed risk; recommend monitor potassium, magnesium and phosphorus labs daily until stable  [employer] po BID, each supplement provides 455 kcal and 20 grams protein. Annie ok per MD)  Daily weights   NUTRITION DIAGNOSIS:   Inadequate oral intake related to altered GI function as evidenced by NPO status. -ongoing   GOAL:   Patient will meet greater than or equal to 90% of their needs -not met   MONITOR:   Diet advancement, Labs, Weight trends, Skin, I & O's  ASSESSMENT:   79 y/o female with h/o HTN, HLD, GERD, seizures, MI, CVA, anxiety, depression and recent admission for volvulus of the cecum with ischemia s/p right hemicolectomy with primary anastomosis 11/13 complicated by post op ileus requiring TPN and who is now admitted with LBO s/p transverse hemicolectomy with primary anastomosis 12/28 complicated by GIB.  RD working remotely.  Pt remains NPO and is now without adequate nutrition for 7 days. NGT in place with output. Recommend TPN initiation; this was discussed with medical team. Pt is at high refeed risk. Per MD, ok for sips of Ensure; RD will add [employer] as pt does not like Ensure. Per chart, pt is down ~14lbs from her UBW currently. Pt with mild hypernatremia today.   Medications reviewed and include: lovenox , protonix   Labs reviewed: Na 147(H), K 3.6 wnl, BUN 29(H) Hgb 7.4(L)   NUTRITION - FOCUSED PHYSICAL EXAM: Unable to perform at this time   Diet Order:   Diet Order             Diet NPO time specified Except for: Sips with Meds, Other (See Comments)  Diet effective now                  EDUCATION NEEDS:   No education needs have been identified at this time  Skin:  Skin Assessment: Reviewed RN Assessment (incision  abdomen)  Last BM:  1/2- type 6  Height:   Ht Readings from Last 1 Encounters:  11/09/24 5' 4 (1.626 m)    Weight:   Wt Readings from Last 1 Encounters:  11/14/24 46.8 kg    Ideal Body Weight:  54.5 kg  BMI:  Body mass index is 17.71 kg/m.  Estimated Nutritional Needs:   Kcal:  1400-1600kcal/day  Protein:  70-80g/day  Fluid:  1.4-1.6L/day  Augustin Shams MS, RD, LDN If unable to be reached, please send secure chat to RD inpatient available from 8:00a-4:00p daily

## 2024-11-15 NOTE — Anesthesia Procedure Notes (Signed)
 Procedure Name: Intubation Date/Time: 11/15/2024 11:09 AM  Performed by: Jaylene Nest, CRNAPre-anesthesia Checklist: Patient identified, Patient being monitored, Timeout performed, Emergency Drugs available and Suction available Patient Re-evaluated:Patient Re-evaluated prior to induction Oxygen Delivery Method: Circle system utilized Preoxygenation: Pre-oxygenation with 100% oxygen Induction Type: IV induction and Rapid sequence Laryngoscope Size: 3 and McGrath Grade View: Grade I Tube type: Oral Tube size: 7.0 mm Number of attempts: 1 Airway Equipment and Method: Stylet and Video-laryngoscopy Placement Confirmation: ETT inserted through vocal cords under direct vision, positive ETCO2 and breath sounds checked- equal and bilateral Secured at: 21 cm Tube secured with: Tape Dental Injury: Teeth and Oropharynx as per pre-operative assessment

## 2024-11-15 NOTE — TOC CM/SW Note (Signed)
 Transition of Care [facility]) CM/SW Note    Transition of Care [facility]) - Inpatient Brief Assessment   Patient Details  Name: Anna Castaneda MRN: [id number] Date of Birth: 1946-03-29  [facility]) CM/SW Contact:    Alfonso Rummer, LCSW Phone Number: 11/15/2024, 4:20 PM   Clinical Narrative: Completed toc chart review no toc needs identified.    Transition of Care Asessment: Insurance and Status: Insurance coverage has been reviewed Patient has primary care physician: Yes     Prior/Current Home Services: No current home services Social Drivers of Health Review: SDOH reviewed no interventions necessary Readmission risk has been reviewed: Yes Transition of care needs: no transition of care needs at this time

## 2024-11-15 NOTE — Procedures (Signed)
 Arterial Catheter Insertion Procedure Note  Dorathea Faerber  [id number]  October 14, 1946  Date:11/15/2024  Time:11:01 PM    Provider Performing: Robet Kim    Procedure: Insertion of Arterial Line (63379) with US  guidance (23062)   Indication(s) Blood pressure monitoring and/or need for frequent ABGs  Consent Unable to obtain consent due to emergent nature of procedure.  Anesthesia None   Time Out Verified patient identification, verified procedure, site/side was marked, verified correct patient position, special equipment/implants available, medications/allergies/relevant history reviewed, required imaging and test results available.   Sterile Technique Maximal sterile technique including full sterile barrier drape, hand hygiene, sterile gown, sterile gloves, mask, hair covering, sterile ultrasound probe cover (if used).   Procedure Description Area of catheter insertion was cleaned with chlorhexidine  and draped in sterile fashion. With real-time ultrasound guidance an arterial catheter was placed into the right femoral artery.  Appropriate arterial tracings confirmed on monitor.     Complications/Tolerance None; patient tolerated the procedure well.   EBL Minimal   Specimen(s) None   Robet Kim, PA-C [facility] Team Contact Info: [phone]

## 2024-11-15 NOTE — Progress Notes (Signed)
 " Progress Note   Patient: Anna Castaneda FMW:[id number] DOB: 06-06-1946 DOA: 11/09/2024     6 DOS: the patient was seen and examined on 11/15/2024     Brief hospital course:   From HPI Nelly Scriven is a 79 y.o. female with medical history significant of recent thickened bolus status post right Hemi colectomy 6 weeks ago with recurrent SBO x 2 afterwards, HTN, HLD, TIA, presented with recurrent abdominal pain and distention.   Symptoms started last night, patient started to feel cramping like abdominal pain and distention.  She reported that she had a normal bowel movement yesterday morning.  Cramping-like pain became constant this morning and patient decided to come to the hospital.  Patient denied any chest pain shortness of breath, no fever or chills.   ED Course: Temperature 97.5 nontachycardic blood pressure 138/77 O2/99% on room air.  CT abdomen pelvis showed high-grade SBO.   NGT placed, patient was given multiple rounds of morphine  and Zofran .        Assessment and Plan: Large bowel obstruction: as per gen surg. S/p partial colectomy 11/10/24 as per gen surg. Recent hemicolectomy in Nov 2025.  NG tube still in place being managed by surgeon Continues to be n.p.o. Surgeon on board and case discussed   Hypocalcemia:-Improved Continue monitoring     Hyponatremia: resolved   Lipase elevation: etiology unclear, possible secondary to SBO.    Thrombocytosis: WNL today    Anemia Hemoglobin stable No evidence of GI bleed noted We will engage GI if this persist I doubt endoscopic intervention cannot be done at this time given ongoing bowel obstruction     DVT prophylaxis: lovenox   Code Status: full  Family Communication:  Disposition Plan: likely d/c back home    Level of care: Telemetry   Status is: Inpatient Remains inpatient appropriate because: severity of illness     Subjective:  Patient seen and examined at bedside this morning Denies worsening abdominal pain  chest pain or cough   Physical Exam:   General exam: appears agitated  Respiratory system: clear breath sounds b/l  Cardiovascular system: S1 & S2+. No rales or rubs  Gastrointestinal system: abd is soft, distended, tenderness to palpation, hypoactive bowel sounds  Central nervous system: alert & oriented. Moves all extremities  Psychiatry: judgement and insight appears at baseline. Agitated mood and affect       Data Reviewed    Vitals:   11/14/24 1506 11/14/24 1952 11/15/24 0308 11/15/24 0914  BP: 123/73 120/61 138/70 (!) 96/56  Pulse: 85 74 93 93  Resp: 17 16 18 14   Temp: 98.6 F (37 C) 97.8 F (36.6 C) 97.8 F (36.6 C)   TempSrc: Oral Oral    SpO2: 95% 100% 100% 100%  Weight:      Height:          Latest Ref Rng & Units 11/15/2024    8:11 AM 11/14/2024    4:42 AM 11/13/2024   11:32 AM  CBC  WBC 4.0 - 10.5 K/uL  6.9    Hemoglobin 12.0 - 15.0 g/dL 7.4  7.9  7.7   Hematocrit 36.0 - 46.0 %  24.6    Platelets 150 - 400 K/uL  345         Latest Ref Rng & Units 11/14/2024    4:42 AM 11/13/2024    5:27 AM 11/11/2024    6:09 AM  BMP  Glucose 70 - 99 mg/dL 869  87  896   BUN 8 -  23 mg/dL 29  28  13    Creatinine 0.44 - 1.00 mg/dL 9.25  9.33  9.31   Sodium 135 - 145 mmol/L 147  146  140   Potassium 3.5 - 5.1 mmol/L 3.6  4.1  4.3   Chloride 98 - 111 mmol/L 112  113  107   CO2 22 - 32 mmol/L 25  22  24    Calcium  8.9 - 10.3 mg/dL 7.7  7.6  7.3      Author: Drue ONEIDA Potter, MD 11/15/2024 1:13 PM  For on call review [email or web address].  "

## 2024-11-15 NOTE — Anesthesia Preprocedure Evaluation (Signed)
"                                    Anesthesia Evaluation  Patient identified by MRN, date of birth, ID band Patient awake    Reviewed: Allergy & Precautions, H&P , NPO status , Patient's Chart, lab work & pertinent test results, reviewed documented beta blocker date and time   Airway Mallampati: III  TM Distance: >3 FB Neck ROM: full    Dental  (+) Caps, Dental Advidsory Given, Teeth Intact, Missing   Pulmonary neg pulmonary ROS, former smoker   Pulmonary exam normal breath sounds clear to auscultation       Cardiovascular Exercise Tolerance: Good hypertension, (-) angina + Past MI (broken heart syndrome)  (-) Cardiac Stents Normal cardiovascular exam(-) dysrhythmias (-) Valvular Problems/Murmurs Rhythm:regular Rate:Normal     Neuro/Psych Seizures - (one time), Well Controlled,   negative psych ROS   GI/Hepatic Neg liver ROS,GERD  Controlled and Medicated,,  Endo/Other  negative endocrine ROS    Renal/GU      Musculoskeletal   Abdominal   Peds  Hematology negative hematology ROS (+)   Anesthesia Other Findings Patient with PMH of recurrent small bowel obstructions presents with a SBO, and recent surgery for colectomy.  She is presenting with bowel leakage vs recurrent SBO vs infarcted bowel.  Patient just had central line and arterial line placed in the ICU and is on norepi gtt.   Past Medical History: 2018: Arthritis 2005: GERD (gastroesophageal reflux disease) No date: Hyperlipidemia No date: Hypertension 02/01/2016: Myocardial infarction [address] [facility])     Comment:  Broken Heart Syndrome 2018: Osteoporosis   Reproductive/Obstetrics negative OB ROS                              Anesthesia Physical Anesthesia Plan  ASA: 4 and emergent  Anesthesia Plan: General ETT   Post-op Pain Management:    Induction: Intravenous, Rapid sequence and Cricoid pressure planned  PONV Risk Score and Plan: 3 and Ondansetron  and  Dexamethasone   Airway Management Planned: Oral ETT  Additional Equipment:   Intra-op Plan:   Post-operative Plan: Post-operative intubation/ventilation  Informed Consent: I have reviewed the patients History and Physical, chart, labs and discussed the procedure including the risks, benefits and alternatives for the proposed anesthesia with the patient or authorized representative who has indicated his/her understanding and acceptance.     Dental Advisory Given  Plan Discussed with: Anesthesiologist, CRNA and Surgeon  Anesthesia Plan Comments: (Patient consented for risks of anesthesia including but not limited to:  - adverse reactions to medications - damage to eyes, teeth, lips or other oral mucosa - nerve damage due to positioning  - sore throat or hoarseness - Damage to heart, brain, nerves, lungs, other parts of body or loss of life  Patient voiced understanding and assent.)         Anesthesia Quick Evaluation  "

## 2024-11-15 NOTE — Progress Notes (Signed)
 Patient ID: Anna Castaneda, female   DOB: [birth year], 79 y.o.   MRN: [id number]   Patient initially stabilized with 2 L of 0.9 normal saline boluses.  She was transferred to the stepdown unit after CT scan of the abdomen and pelvis was performed.  CT scan of the abdomen and pelvis extremely complicated.  I discussed personally the images with the radiologist which had to consult second radiologist and still we were unable to explain the patient current clinical status.  There is low suspicious of active bleeding but there is suspected pneumatosis on the left lower quadrant with significant amount of free air and free fluid.  This could be suspicious for anastomosis leak versus bowel ischemia.  Patient clinically deteriorating with persistent hypotension.  2 units of PRBCs has been ordered.  Central line is being placed for now to start vasopressors.  Patient has been alert and understanding her current situation.  Abdomen continues to be distended, depressible.  Tender to palpation.  Vital signs with last blood pressure of 64/46 with heart rate of 114.  She agrees to proceed with exploratory laparotomy.  Verbal consent was taken from patient.  I also discussed care with anesthesiologist and our team to proceed with emergent exploratory laparotomy.  Discussed with patient that she will need mechanical ventilation for respiratory failure.  Lucas Sjogren, MD

## 2024-11-15 NOTE — Consult Note (Signed)
 "  NAME:  Anna Castaneda, MRN:  [id number], DOB:  30-Oct-1946, LOS: 6 ADMISSION DATE:  11/09/2024, CONSULTATION DATE:  11/15/2024 REFERRING MD:  Dr. Rodolph, CHIEF COMPLAINT:  Hypotension, AMS   History of Present Illness:  Anna Castaneda is a 79 y.o. female with medical history significant of recent thickened colon status post right hemi-colectomy 6 weeks ago with recurrent SBO x 2 afterwards, HTN, HLD, TIA, presented with recurrent abdominal pain and distention on 12/27.   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  At that time she had a right hemicolectomy with primary anastomosis.  She recovered well from this and was discharged home a few days later.  On December 10th she presented back to the hospital with abdominal distention and a CT scan was concerning for small bowel obstruction.  She had NG tube decompression at that time and started to have return of bowel function and was able to tolerate a diet and was discharged.    Symptoms began 11/14/24 prompting arrival to [facility] ED, which included cramps with abdominal pain and distention.  Most recent normal bowel movement was the morning of 11/14/24.  Cramping-like pain became constant and she decided to come to [facility] ED for evaluation.  Patient denied any chest pain shortness of breath, no fever or chills.  ED Course:  Vitals on Arrival: Temp 97.5, HR 79 BP 138/77, RR 22, O2/99% on room air.  Medications Administered: Morphine  and Zofran  Pertinent Labs/Diagnostics: Na 129, Glucose 166, Lipase 110, Lactic 2.1, Plts 447, WBC 6.1, Hgb: 13.3 12/27 CT abdomen/pelvis: high-grade small bowel obstruction  Admitted to the floor; General surgery following.  Hospital Course: See below under significant hospital events  Most Recent Pertinent Labs/Diagnostics (1/2): ABG: ph 7.06, pCO2 43, pO2 199, Bicarb 12.2 Lactic Acid:  >9 Hgb: 8.4 >> 9.7 Platelets: 345>>135 INR: 2.7 Potassium: 5.2 Serum CO2: 12 AG: 23 Mg: 2.6 Phos: 10.2 Glucose: 208 Cr: 1.28 AST: >7000 ALT: 6763 Troponin: 21 >> 85 WBC: 5.2  Cta Abd/Pelvis Impression -  VASCULAR 1. Negative for aortic dissection or aneurysm. No acute occlusive disease. 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 2. No gross intraluminal extravasation of contrast to suggest active GI bleed within non-opacified bowel, however note that there is intraluminal contrast present within bowel loops and these areas cannot be adequately assessed for the presence of GI bleeding.   NON-VASCULAR 1. Limited assessment of the surgical anatomy secondary to diffuse fluid-filled small bowel, lack of soft tissue contrast and the presence of ascites and edema. Multiple fluid-filled suspected hypoenhancing small bowel in the abdomen and pelvis. Peripheral distribution of gas within some of the left lower quadrant small bowel raising concern for ischemia. Source of dilated small bowel is unclear but there is some disorganized appearance of mesenteric vessels on the left side of the abdomen raising possibility of a hernia or partial volvulus. 2. Moderate free intraperitoneal gas and moderate volume of ascites. The quantity of gas and fluid is greater than that would be expected for postoperative gas and perforated viscus is a concern. 3. Small filling defects within right lower lobe subsegmental vessels consistent with acute pulmonary embolus.  Pertinent Medical History  Hypertension Hyperlipidemia Osteoporosis GERD  Significant Hospital Events: Including procedures, antibiotic start and stop dates in addition to other pertinent events   11/09/24: Presented to [facility] ED with concern for abdominal pain and distention. Imaging revealed SBO,  General Surgery was consulted but did not think surgical intervention was needed at that time. NG tube was placed to low continuous  suction. 11/10/24: Evaluated by Dr. Cesar; CT revealed more concern for obstruction. Scheduled for Exploratory Laparotomy 11/11/24: Post Op day 1, no acute events; HD stable.  11/12/24: Post Op day 2, patient without pain and no further complications. No acute events overnight. No bowel movement at that time. HD stable but intermittently agitated. KUB showed contrast in colon; plan to clamp NGT.  11/13/24: Bloody bowel movement without sx of dizziness, abd pain, n/v. Hgb declined from 10.9 to 7.6. Nausea reported in the afternoon; NGT back to suction. 11/14/24: No acute events overnight. Reported increased abdominal distention without n/v. Denied worsening abdominal pain. Mildly agitated. HD stable. 11/15/24: Evaluated in the am without overnight events and was HD stable. She appeared frustrated d/t lack of GI function but denied any pain. Mild nausea, NGT still at suction. Small bloody output rectally. Surgery called to evaluate the pt d/t significant change in clinical status with pain 10/10. She wanted help to the bathroom and had a BM with dark red blood, resulting in a near syncopal event. BP began to decline with MAPs in the mid-high 60s and was tachycardic. 1L of NS given; BP slightly increased. Persistent hypotension with worsening encephalopathy requiring PCCM consult for vasopressors; CVC and arterial line placed. 2 units pRBCs ordered. Plan for Dr. Cintron to take her back to the OR given recent Abd/Pelvis CT findings with concern for anastomosis leak vs bowel ischemia. Post operatively was found to have acute intestinal ischemia.  Interim History / Subjective:  Patient pale, lethargic, AMS with hypotension requiring levophed  gtt. Case discussed with surgery. She was emergently taken to the OR for an exploratory laparotomy.  Objective    Blood pressure (!) 92/58, pulse (!) 45, temperature (!) 97.2 F (36.2 C), temperature source Other (Comment), resp. rate 17, height 5' 4 (1.626 m), weight  46.8 kg, SpO2 95%.        Intake/Output Summary (Last 24 hours) at 11/15/2024 2121 Last data filed at 11/15/2024 1600 Gross per 24 hour  Intake --  Output 550 ml  Net -550 ml   Filed Weights   11/09/24 0339 11/14/24 0500  Weight: 53.3 kg 46.8 kg    Examination: General: ill appearing female, intubated and sedated in NAD HENT: atraumatic, normocephalic, supple, no JVD Lungs: clear to auscultation bilaterally, no crackles/wheezing/rales Cardiovascular: NSR, S1 S2, no m/r/g Abdomen: soft, tender, non-distended s/p ex-lap, wound vac in place, bowel sounds hypoactive Extremities: cool, intact/dry, radial pulses 2+, distal 1+ Neuro: intubated and sedated, no focal neuro deficits, PERRL GU: deferred  Resolved problem list   Assessment and Plan   #Acute Metabolic Encephalopathy s/t Septic Shock #Sedation Requirements in the setting of Mechanical Ventilation - PAD protocol - Sedation to maintain RASS goal - Fentanyl  and propofol  gtt - PRN Versed  - Wean sedation as able - WUA as indicated - Supportive care  #Acute Hypoxic Respiratory Failure #Post-Op Mechanical Ventilation #Pulmonary Embolism - Full vent support - FiO2 and PEEP to maintain SpO2 >90% - Wean FiO2 and PEEP as able - VAP bundle - Pulmonary hygiene - Ensure plateau pressures remain less than 30 cm H20 - Duonebs prn - CXR and ABG prn - Repeat ABG improving - SBT once neuro and respiratory parameters met - Can consider heparin  gtt for PE; cleared by Surgery - Holding heparin  gtt for now given thrombocytopenia  #Septic Shock secondary to Intestinal Ischemia s/p  Exploratory Laparotomy #Mildly Elevated Troponin s/t Demand Ischemia - Continuous cardiac monitoring - Vasopressors to maintain MAP goal >65 ~ currently on levophed  - Increasing pressor requirements, will add vasopressin  - Wean pressors as able - Trend troponin until peaked - Trend lactic acid until peaked; most recent >9 - Started on Zosyn  post  operatively - Will order 2 amps bicarb, followed by bicarb gtt and 1 amp of calcium  chloride - Completed albumin  25g x 1  #Metabolic Acidosis s/t Septic Shock iso Intestinal Ischemia #AKI s/t Septic Shock #Hyponatremia ~ Resolved #Hypocalcemia #Hyperkalemia - Strict I/O - Trend BMP and monitor renal function - Avoid nephrotoxins as able - Ensure adequate renal perfusion - 2 amps bicarb followed by bicarb gtt - Calcium  chloride amp - ICU electrolyte replacement protocol - Not at the point of needing CRRT right now but may need it in the future if not improving  #Acute Intestinal Ischemia iso #Acute Large Bowel Obstruction #Recurrent Bowel Obstruction s/p Right Hemi-Colectomy (6 weeks ago) #Post Op Ileus #Shock Liver #Hypoalbuminemia 11/15/24 Abd/Pelvis CT Impression Diffuse fluid-filled small bowel, lack of soft tissue contrast and the presence of ascites and edema. Multiple fluid-filled suspected hypoenhancing small bowel in the abdomen and pelvis. Peripheral distribution of gas within some of the left lower quadrant small bowel raising concern for ischemia. Source of dilated small bowel is unclear but there is some disorganized appearance of mesentericvessels on the left side of the abdomen raising possibility of a hernia or partial volvulus.  Moderate free intraperitoneal gas and moderate volume of ascites. The quantity of gas and fluid is greater than that would be expected for postoperative gas and perforated viscus is a concern. Small filling defects within right lower lobe subsegmental vessels consistent with acute pulmonary embolus - Trend hepatic function - AST/ALT: >7000/6763 - Diet: NPO - Constipation protocol prn - Monitor wound vac as indicated - GI prophylaxis: Protonix  40mg   - General Surgery following; appreciate input - Per Dr. Cesar, he is planning to take her back to the OR on Sunday for possible anastamosis  #Acute Blood Loss  Anemia #Thrombocytopenia #Supratherapeutic INR (2.7) - Trend CBC and monitor coags - Transfuse if Hgb < 7 or platelets <10k - Previously received 2 units pRBCs - 1 unit FFP - Monitor for s/sx of bleeding - VTE prophylaxis: Lovenox  40mg  - Hold off on heparin  gtt for now given significant drop in platelets  #Hyperglycemia without diagnosis of Type II Diabetes - ICU hypo/hyperglycemia protocol - SSI - CBG Q4h - Goal range 140-180  Labs   CBC: Recent Labs  Lab 11/09/24 0348 11/10/24 0439 11/11/24 0609 11/13/24 0527 11/13/24 1132 11/14/24 0442 11/15/24 0811 11/15/24 1921  WBC 6.1 8.5 6.1 7.1  --  6.9  --   --   NEUTROABS  --   --   --   --   --  5.5  --   --   HGB 13.3 13.1 10.9* 7.6* 7.7* 7.9* 7.4* 8.4*  HCT 40.3 39.0 32.9* 23.4*  --  24.6*  --  27.0*  MCV 95.5 94.9 95.4 98.3  --  97.6  --   --   PLT 447* 425* 363 272  --  345  --   --     Basic Metabolic Panel: Recent Labs  Lab 11/09/24 0348 11/10/24 0439 11/11/24 0609 11/13/24 0527 11/14/24 0442  NA 129* 138 140 146* 147*  K 4.2 3.8 4.3 4.1 3.6  CL 94* 101 107 113* 112*  CO2 22 25 24  22  25  GLUCOSE 166* 92 103* 87 130*  BUN 14 12 13  28* 29*  CREATININE 0.86 0.82 0.68 0.66 0.74  CALCIUM  9.3 8.5* 7.3* 7.6* 7.7*   GFR: Estimated Creatinine Clearance: 42.8 mL/min (by C-G formula based on SCr of 0.74 mg/dL). Recent Labs  Lab 11/09/24 0348 11/09/24 0653 11/10/24 0439 11/11/24 0609 11/13/24 0527 11/14/24 0442  WBC 6.1  --  8.5 6.1 7.1 6.9  LATICACIDVEN 2.1* 0.9  --   --   --   --     Liver Function Tests: Recent Labs  Lab 11/09/24 0348  AST 24  ALT 15  ALKPHOS 46  BILITOT 0.4  PROT 7.2  ALBUMIN  4.5   Recent Labs  Lab 11/09/24 0348  LIPASE 110*   No results for input(s): AMMONIA in the last 168 hours.  ABG    Component Value Date/Time   TCO2 18 (L) 05/21/2019 1445     Coagulation Profile: No results for input(s): INR, PROTIME in the last 168 hours.  Cardiac Enzymes: No  results for input(s): CKTOTAL, CKMB, CKMBINDEX, TROPONINI in the last 168 hours.  HbA1C: Hgb A1c MFr Bld  Date/Time Value Ref Range Status  10/03/2024 05:43 AM 5.5 4.8 - 5.6 % Final    Comment:    (NOTE) Diagnosis of Diabetes The following HbA1c ranges recommended by the American Diabetes Association (ADA) may be used as an aid in the diagnosis of diabetes mellitus.  Hemoglobin             Suggested A1C NGSP%              Diagnosis  <5.7                   Non Diabetic  5.7-6.4                Pre-Diabetic  >6.4                   Diabetic  <7.0                   Glycemic control for                       adults with diabetes.    05/22/2019 04:55 AM 5.2 4.8 - 5.6 % Final    Comment:    (NOTE) Pre diabetes:          5.7%-6.4% Diabetes:              >6.4% Glycemic control for   <7.0% adults with diabetes     CBG: Recent Labs  Lab 11/15/24 1912 11/15/24 2042  GLUCAP 183* 228*    Review of Systems:   Unable to assess as patient is intubated and sedated  Past Medical History:  She,  has a past medical history of Arthritis (2018), GERD (gastroesophageal reflux disease) (2005), Hyperlipidemia, Hypertension, Myocardial infarction (HCC) (02/01/2016), and Osteoporosis (2018).   Surgical History:   Past Surgical History:  Procedure Laterality Date   APPLICATION OF WOUND VAC  09/29/2024   Procedure: APPLICATION, WOUND VAC;  Surgeon: Rodolph Romano, MD;  Location: [facility] ORS;  Service: General;;   CATARACT EXTRACTION Bilateral 2004   COLONOSCOPY WITH PROPOFOL  N/A 09/02/2019   Procedure: COLONOSCOPY WITH PROPOFOL ;  Surgeon: Toledo, Ladell POUR, MD;  Location: [facility] ENDOSCOPY;  Service: Gastroenterology;  Laterality: N/A;   LAPAROTOMY N/A 09/29/2024   Procedure: LAPAROTOMY, EXPLORATORY with COLECTOMY;  Surgeon: Rodolph Romano, MD;  Location: [facility] ORS;  Service: General;  Laterality: N/A;   LAPAROTOMY N/A 11/10/2024   Procedure: LAPAROTOMY, EXPLORATORY  PARTIAL COLECTOMY;  Surgeon: Rodolph Romano, MD;  Location: [facility] ORS;  Service: General;  Laterality: N/A;   TONSILLECTOMY Bilateral 1954   TUBAL LIGATION  1980     Social History:   reports that she quit smoking about 35 years ago. Her smoking use included cigarettes. She started smoking about 50 years ago. She has a 15 pack-year smoking history. She has never used smokeless tobacco. She reports current alcohol  use of about 7.0 standard drinks of alcohol  per week. She reports that she does not use drugs.   Family History:  Her family history is negative for Breast cancer.   Allergies Allergies[1]   Home Medications  Prior to Admission medications  Medication Sig Start Date End Date Taking? Authorizing Provider  aluminum hydroxide-magnesium carbonate (GAVISCON) 95-358 mg/15 mL SUSP Take by mouth every evening. nightly   Yes [provider]  Ascorbic Acid (VITAMIN C) 1000 MG tablet Take 1,000 mg by mouth daily.   Yes [provider]  atorvastatin  (LIPITOR) 20 MG tablet Take 20 mg by mouth daily.   Yes [provider]  CALCIUM  LACTATE PO Take by mouth.   Yes [provider]  cetirizine (ZYRTEC) 10 MG tablet Take 10 mg by mouth daily as needed for allergies.    Yes [provider]  Cholecalciferol (VITAMIN D3) 50 MCG (2000 UT) capsule Take 2,000 Units by mouth daily.   Yes [provider]  clobetasol cream (TEMOVATE) 0.05 % Apply 1 Application topically 2 (two) times daily. 09/23/24  Yes [provider]  co-enzyme Q-10 50 MG capsule Take 200 mg by mouth daily. Ubidecarenone (Co-Enzyme Q-10, Ubiquinone) 200 tablet   Yes [provider]  cyanocobalamin (VITAMIN B12) 500 MCG tablet Take 1,000 mcg by mouth daily.   Yes [provider]  denosumab (PROLIA) 60 MG/ML SOSY injection Inject 60 mg into the skin every 6 (six) months.   Yes [provider]  doxycycline (VIBRA-TABS) 100 MG tablet Take 100 mg by  mouth daily. 10/28/24  Yes [provider]  FERROUS FUM-IRON POLYSACCH PO Take by mouth. Iron fum, poly #1-vitC-L.casei 130 mg iron-25 mg-30 mg Cap   Yes [provider]  ketoconazole (NIZORAL) 2 % cream Apply 1 Application topically 2 (two) times daily. 10/05/23  Yes [provider]  lisinopril (ZESTRIL) 10 MG tablet Take 10 mg by mouth daily. 10/23/23  Yes [provider]  multivitamin-lutein (OCUVITE-LUTEIN) CAPS capsule Take 1 capsule by mouth 2 (two) times daily.   Yes [provider]  niacinamide 500 MG tablet Take 500 mg by mouth daily.   Yes [provider]  NON FORMULARY Take by mouth gelatin sponge, absorb/porcine (Gelatine Absorbable MISC)   Yes [provider]  NON FORMULARY Take by mouth C, E, zinc, copper 11/omega3s/lut (OCUVITE ADULT50+ ORAL)   Yes [provider]  NON FORMULARY Calm with Calcium    Yes [provider]  NON FORMULARY CocoaVia   Yes [provider]  pantoprazole  (PROTONIX ) 20 MG tablet Take 20 mg by mouth daily.   Yes [provider]  pimecrolimus (ELIDEL) 1 % cream Apply 1 Application topically 2 (two) times daily.   Yes [provider]     Critical care time: 70 minutes      Robet Kim, PA-C [facility] PCCM Team Contact Info: [phone]       [1] No Known Allergies  "

## 2024-11-15 NOTE — Progress Notes (Signed)
 Patient ID: Belem Hintze, female   DOB: 03/16/1946, 79 y.o.   MRN: [id number]     SURGICAL PROGRESS NOTE   Hospital Day(s): 6.   Interval History: I returned to see the patient due to abrupt change in her clinical status with pain of 10 out of 10.  Patient has been ambulating comfortably around the nurses station without any pain.  She has been very active as she usually eats there in the last few days.  Upon arrival to the room patient was found sitting down on the toilet bowl with dry oral mucosa and pale.  She was significantly weak.  She endorses pain 10 out of 10 throughout the abdomen.  Her blood pressure was 96/56 MAP 68, with a heart rate of 120.  Patient was placed back on her bed.  1 L of normal saline was given.  Blood pressure increase to 110/78 with heart rate of 94.  She was alert, oriented uncooperative during the evaluation.  Vital signs in last 24 hours: [min-max] current  Temp:  [97.8 F (36.6 C)-98.6 F (37 C)] 98.6 F (37 C) (01/02 1553) Pulse Rate:  [74-93] 76 (01/02 1553) Resp:  [14-18] 17 (01/02 1553) BP: (96-138)/(56-70) 138/64 (01/02 1553) SpO2:  [98 %-100 %] 98 % (01/02 1553)     Height: 5' 4 (162.6 cm) Weight: 46.8 kg BMI (Calculated): 17.71   Physical Exam:  Constitutional: alert, cooperative   Respiratory: breathing non-labored at rest  Cardiovascular: Sinus tachycardia Gastrointestinal: soft, non-tender, but still distended  Labs:     Latest Ref Rng & Units 11/15/2024    7:21 PM 11/15/2024    8:11 AM 11/14/2024    4:42 AM  CBC  WBC 4.0 - 10.5 K/uL   6.9   Hemoglobin 12.0 - 15.0 g/dL 8.4  7.4  7.9   Hematocrit 36.0 - 46.0 % 27.0   24.6   Platelets 150 - 400 K/uL   345       Latest Ref Rng & Units 11/14/2024    4:42 AM 11/13/2024    5:27 AM 11/11/2024    6:09 AM  CMP  Glucose 70 - 99 mg/dL 869  87  896   BUN 8 - 23 mg/dL 29  28  13    Creatinine 0.44 - 1.00 mg/dL 9.25  9.33  9.31   Sodium 135 - 145 mmol/L 147  146  140   Potassium 3.5 - 5.1 mmol/L 3.6   4.1  4.3   Chloride 98 - 111 mmol/L 112  113  107   CO2 22 - 32 mmol/L 25  22  24    Calcium  8.9 - 10.3 mg/dL 7.7  7.6  7.3     Imaging studies: No new pertinent imaging studies   Assessment/Plan:  Patient just reevaluated due to abrupt change in her clinical status.  During the day she was very active, walking around the nurses station without any significant complaints  She abruptly became with generalized abdominal pain 10 out of 10, hypotensive tachycardic, pale looking.  With putting the patient on supine position and starting hand on normal saline for drip her blood pressure improved and sinus tachycardia resolved.  Repeat hemoglobin is 8.4 which this morning was 7.4.  Even though she has not received any transfusion the fact that the hemoglobin has not dropped significantly it is questionable for active bleeding.  I do not think that her hemoglobin is 8.4.  I will repeat hemoglobin in about an hour after she received adequate  IV fluid resuscitation to reassess her hemoglobin status.  Troponins are in process.  EKG with sinus tachycardia.  Will order CTA of the abdomen and pelvis to rule out intra-abdominal/intraperitoneal bleeding.  Will continue to follow closely throughout the night  Lucas Petrin, MD

## 2024-11-15 NOTE — Progress Notes (Signed)
" °   11/15/24 1930  Spiritual Encounters  Type of Visit Follow up  Care provided to: Patient  Conversation partners present during encounter Nurse  Referral source Chaplain assessment;Other (comment) (Follow up)  Reason for visit Routine spiritual support  OnCall Visit Yes  Spiritual Framework  Presenting Themes Meaning/purpose/sources of inspiration;Impactful experiences and emotions  Interventions  Spiritual Care Interventions Made Established relationship of care and support;Compassionate presence;Reflective listening;Meaning making;Prayer;Encouragement  Intervention Outcomes  Outcomes Connection to spiritual care;Awareness around self/spiritual resourses;Awareness of health;Awareness of support;Reduced anxiety    "

## 2024-11-15 NOTE — Progress Notes (Signed)
 Patient placed back on low wall intermittent suction

## 2024-11-15 NOTE — Progress Notes (Signed)
" °   11/15/24 1908  Spiritual Encounters  Type of Visit Initial  Care provided to: Pt not available (Rapid Response)  Conversation partners present during encounter Other (comment) (Rapid Response Team)  Referral source Code page  Reason for visit Code  OnCall Visit Yes  Interventions  Spiritual Care Interventions Made Prayer;Other (comment) (Silent prayer for Pt.)  Spiritual Care Plan  Spiritual Care Issues Still Outstanding Referring to oncoming chaplain for further support    "

## 2024-11-15 NOTE — Progress Notes (Signed)
 Patient ID: Tilla Wilborn, female   DOB: 1946/03/02, 79 y.o.   MRN: [id number]     SURGICAL PROGRESS NOTE   Hospital Day(s): 6.   Interval History: Patient seen and examined, no acute events or new complaints overnight. Patient reports feeling frustrated about not having GI function.  Otherwise, she denies abdominal pain.  She does feel nauseated in the afternoon and NGT was put to suction again.  She had a small bloody output rectally.  Small amount of gas.  Vital signs in last 24 hours: [min-max] current  Temp:  [97.8 F (36.6 C)-98.6 F (37 C)] 97.8 F (36.6 C) (01/02 0308) Pulse Rate:  [74-93] 93 (01/02 0308) Resp:  [16-18] 18 (01/02 0308) BP: (120-138)/(61-73) 138/70 (01/02 0308) SpO2:  [95 %-100 %] 100 % (01/02 0308)     Height: 5' 4 (162.6 cm) Weight: 46.8 kg BMI (Calculated): 17.71   Physical Exam:  Constitutional: alert, cooperative and no distress  Respiratory: breathing non-labored at rest  Cardiovascular: regular rate and sinus rhythm  Gastrointestinal: soft, non-tender, but still distended  Labs:     Latest Ref Rng & Units 11/14/2024    4:42 AM 11/13/2024   11:32 AM 11/13/2024    5:27 AM  CBC  WBC 4.0 - 10.5 K/uL 6.9   7.1   Hemoglobin 12.0 - 15.0 g/dL 7.9  7.7  7.6   Hematocrit 36.0 - 46.0 % 24.6   23.4   Platelets 150 - 400 K/uL 345   272       Latest Ref Rng & Units 11/14/2024    4:42 AM 11/13/2024    5:27 AM 11/11/2024    6:09 AM  CMP  Glucose 70 - 99 mg/dL 869  87  896   BUN 8 - 23 mg/dL 29  28  13    Creatinine 0.44 - 1.00 mg/dL 9.25  9.33  9.31   Sodium 135 - 145 mmol/L 147  146  140   Potassium 3.5 - 5.1 mmol/L 3.6  4.1  4.3   Chloride 98 - 111 mmol/L 112  113  107   CO2 22 - 32 mmol/L 25  22  24    Calcium  8.9 - 10.3 mg/dL 7.7  7.6  7.3     Imaging studies: No new pertinent imaging studies   Assessment/Plan:  Large bowel obstruction - S/p partial colectomy - Still with prolonged postop ileus -Abdomen softer this morning, will do NGT clamp trial  again.  Patient may drink sips of Ensure - Encouraged the patient to ambulate  Lucas Petrin, MD

## 2024-11-15 NOTE — Plan of Care (Signed)
   Problem: Education: Goal: Knowledge of General Education information will improve Description Including pain rating scale, medication(s)/side effects and non-pharmacologic comfort measures Outcome: Progressing   Problem: Health Behavior/Discharge Planning: Goal: Ability to manage health-related needs will improve Outcome: Progressing

## 2024-11-16 DIAGNOSIS — J9601 Acute respiratory failure with hypoxia: Secondary | ICD-10-CM

## 2024-11-16 DIAGNOSIS — E8729 Other acidosis: Secondary | ICD-10-CM

## 2024-11-16 DIAGNOSIS — K7200 Acute and subacute hepatic failure without coma: Secondary | ICD-10-CM | POA: Diagnosis not present

## 2024-11-16 DIAGNOSIS — A419 Sepsis, unspecified organism: Secondary | ICD-10-CM | POA: Diagnosis not present

## 2024-11-16 DIAGNOSIS — N179 Acute kidney failure, unspecified: Secondary | ICD-10-CM | POA: Diagnosis not present

## 2024-11-16 DIAGNOSIS — I2699 Other pulmonary embolism without acute cor pulmonale: Secondary | ICD-10-CM

## 2024-11-16 DIAGNOSIS — R6521 Severe sepsis with septic shock: Secondary | ICD-10-CM | POA: Diagnosis not present

## 2024-11-16 DIAGNOSIS — K56609 Unspecified intestinal obstruction, unspecified as to partial versus complete obstruction: Secondary | ICD-10-CM | POA: Diagnosis not present

## 2024-11-16 LAB — BLOOD GAS, ARTERIAL
Acid-base deficit: 17.7 mmol/L — ABNORMAL HIGH (ref 0.0–2.0)
Acid-base deficit: 9.2 mmol/L — ABNORMAL HIGH (ref 0.0–2.0)
Acid-base deficit: 5.9 mmol/L — ABNORMAL HIGH (ref 0.0–2.0)
Bicarbonate: 12.2 mmol/L — ABNORMAL LOW (ref 20.0–28.0)
Bicarbonate: 16.2 mmol/L — ABNORMAL LOW (ref 20.0–28.0)
Bicarbonate: 18.6 mmol/L — ABNORMAL LOW (ref 20.0–28.0)
FIO2: 40 %
FIO2: 40 %
FIO2: 35 %
MECHVT: 420 mL
MECHVT: 420 mL
Mechanical Rate: 14
Mechanical Rate: 24
O2 Saturation: 100 %
O2 Saturation: 100 %
O2 Saturation: 99.8 %
PEEP: 5 cmH2O
PEEP: 5 cmH2O
PEEP: 5 cmH2O
Patient temperature: 37
Patient temperature: 37
Patient temperature: 37
Spontaneous VT: 452 mL
pCO2 arterial: 43 mmHg (ref 32–48)
pCO2 arterial: 33 mmHg (ref 32–48)
pCO2 arterial: 33 mmHg (ref 32–48)
pH, Arterial: 7.06 — CL (ref 7.35–7.45)
pH, Arterial: 7.3 — ABNORMAL LOW (ref 7.35–7.45)
pH, Arterial: 7.36 (ref 7.35–7.45)
pO2, Arterial: 199 mmHg — ABNORMAL HIGH (ref 83–108)
pO2, Arterial: 187 mmHg — ABNORMAL HIGH (ref 83–108)
pO2, Arterial: 154 mmHg — ABNORMAL HIGH (ref 83–108)

## 2024-11-16 LAB — TROPONIN T, HIGH SENSITIVITY: Troponin T High Sensitivity: 85 ng/L — ABNORMAL HIGH (ref 0–19)

## 2024-11-16 LAB — CBC WITH DIFFERENTIAL/PLATELET
Abs Immature Granulocytes: 0.44 K/uL — ABNORMAL HIGH (ref 0.00–0.07)
Basophils Absolute: 0 K/uL (ref 0.0–0.1)
Basophils Relative: 1 %
Eosinophils Absolute: 0 K/uL (ref 0.0–0.5)
Eosinophils Relative: 1 %
HCT: 30.8 % — ABNORMAL LOW (ref 36.0–46.0)
Hemoglobin: 9.7 g/dL — ABNORMAL LOW (ref 12.0–15.0)
Immature Granulocytes: 8 %
Lymphocytes Relative: 18 %
Lymphs Abs: 0.9 K/uL (ref 0.7–4.0)
MCH: 31.4 pg (ref 26.0–34.0)
MCHC: 31.5 g/dL (ref 30.0–36.0)
MCV: 99.7 fL (ref 80.0–100.0)
Monocytes Absolute: 0.3 K/uL (ref 0.1–1.0)
Monocytes Relative: 5 %
Neutro Abs: 3.5 K/uL (ref 1.7–7.7)
Neutrophils Relative %: 67 %
Platelets: 135 K/uL — ABNORMAL LOW (ref 150–400)
RBC: 3.09 MIL/uL — ABNORMAL LOW (ref 3.87–5.11)
RDW: 16.3 % — ABNORMAL HIGH (ref 11.5–15.5)
Smear Review: NORMAL
WBC: 5.2 K/uL (ref 4.0–10.5)
nRBC: 1.2 % — ABNORMAL HIGH (ref 0.0–0.2)

## 2024-11-16 LAB — COMPREHENSIVE METABOLIC PANEL WITH GFR
ALT: 6763 U/L — ABNORMAL HIGH (ref 0–44)
AST: >7000 U/L — ABNORMAL HIGH (ref 15–41)
Albumin: 2.2 g/dL — ABNORMAL LOW (ref 3.5–5.0)
Alkaline Phosphatase: 75 U/L (ref 38–126)
Anion gap: 23 — ABNORMAL HIGH (ref 5–15)
BUN: 27 mg/dL — ABNORMAL HIGH (ref 8–23)
CO2: 12 mmol/L — ABNORMAL LOW (ref 22–32)
Calcium: 7.1 mg/dL — ABNORMAL LOW (ref 8.9–10.3)
Chloride: 108 mmol/L (ref 98–111)
Creatinine, Ser: 1.28 mg/dL — ABNORMAL HIGH (ref 0.44–1.00)
GFR, Estimated: 43 mL/min — ABNORMAL LOW
Glucose, Bld: 208 mg/dL — ABNORMAL HIGH (ref 70–99)
Potassium: 5.2 mmol/L — ABNORMAL HIGH (ref 3.5–5.1)
Sodium: 142 mmol/L (ref 135–145)
Total Bilirubin: 0.7 mg/dL (ref 0.0–1.2)
Total Protein: 3.6 g/dL — ABNORMAL LOW (ref 6.5–8.1)

## 2024-11-16 LAB — CBC
HCT: 24.8 % — ABNORMAL LOW (ref 36.0–46.0)
Hemoglobin: 8.4 g/dL — ABNORMAL LOW (ref 12.0–15.0)
MCH: 31.2 pg (ref 26.0–34.0)
MCHC: 33.9 g/dL (ref 30.0–36.0)
MCV: 92.2 fL (ref 80.0–100.0)
Platelets: 122 K/uL — ABNORMAL LOW (ref 150–400)
RBC: 2.69 MIL/uL — ABNORMAL LOW (ref 3.87–5.11)
RDW: 15.9 % — ABNORMAL HIGH (ref 11.5–15.5)
WBC: 8.9 K/uL (ref 4.0–10.5)
nRBC: 5.5 % — ABNORMAL HIGH (ref 0.0–0.2)

## 2024-11-16 LAB — LACTIC ACID, PLASMA
Lactic Acid, Venous: >9 mmol/L (ref 0.5–1.9)
Lactic Acid, Venous: 8.1 mmol/L (ref 0.5–1.9)
Lactic Acid, Venous: 8.6 mmol/L (ref 0.5–1.9)
Lactic Acid, Venous: 7.6 mmol/L (ref 0.5–1.9)
Lactic Acid, Venous: 7.2 mmol/L (ref 0.5–1.9)
Lactic Acid, Venous: 7.9 mmol/L (ref 0.5–1.9)
Lactic Acid, Venous: 7.6 mmol/L (ref 0.5–1.9)

## 2024-11-16 LAB — GLUCOSE, CAPILLARY
Glucose-Capillary: 161 mg/dL — ABNORMAL HIGH (ref 70–99)
Glucose-Capillary: 131 mg/dL — ABNORMAL HIGH (ref 70–99)
Glucose-Capillary: 108 mg/dL — ABNORMAL HIGH (ref 70–99)
Glucose-Capillary: 79 mg/dL (ref 70–99)
Glucose-Capillary: 112 mg/dL — ABNORMAL HIGH (ref 70–99)
Glucose-Capillary: 65 mg/dL — ABNORMAL LOW (ref 70–99)
Glucose-Capillary: 99 mg/dL (ref 70–99)

## 2024-11-16 LAB — MAGNESIUM: Magnesium: 2.6 mg/dL — ABNORMAL HIGH (ref 1.7–2.4)

## 2024-11-16 LAB — MRSA NEXT GEN BY PCR, NASAL: MRSA by PCR Next Gen: NOT DETECTED

## 2024-11-16 LAB — BASIC METABOLIC PANEL WITH GFR
Anion gap: 22 — ABNORMAL HIGH (ref 5–15)
BUN: 32 mg/dL — ABNORMAL HIGH (ref 8–23)
CO2: 18 mmol/L — ABNORMAL LOW (ref 22–32)
Calcium: 8 mg/dL — ABNORMAL LOW (ref 8.9–10.3)
Chloride: 106 mmol/L (ref 98–111)
Creatinine, Ser: 1.72 mg/dL — ABNORMAL HIGH (ref 0.44–1.00)
GFR, Estimated: 30 mL/min — ABNORMAL LOW
Glucose, Bld: 140 mg/dL — ABNORMAL HIGH (ref 70–99)
Potassium: 5.1 mmol/L (ref 3.5–5.1)
Sodium: 145 mmol/L (ref 135–145)

## 2024-11-16 LAB — PROTIME-INR
INR: 2.7 — ABNORMAL HIGH (ref 0.8–1.2)
Prothrombin Time: 30.2 s — ABNORMAL HIGH (ref 11.4–15.2)

## 2024-11-16 LAB — PHOSPHORUS: Phosphorus: 10.2 mg/dL — ABNORMAL HIGH (ref 2.5–4.6)

## 2024-11-16 MED ORDER — KETAMINE HCL 50 MG/5ML IJ SOSY
PREFILLED_SYRINGE | INTRAMUSCULAR | Status: AC
  Filled 2024-11-16: qty 5

## 2024-11-16 MED ORDER — KETAMINE HCL 50 MG/5ML IJ SOSY
PREFILLED_SYRINGE | INTRAMUSCULAR | Status: DC | PRN
  Administered 2024-11-16: 20 mg via INTRAVENOUS

## 2024-11-16 MED ORDER — NOREPINEPHRINE 4 MG/250ML-% IV SOLN
INTRAVENOUS | Status: AC
  Filled 2024-11-16: qty 250

## 2024-11-16 MED ORDER — LIDOCAINE HCL (PF) 2 % IJ SOLN
INTRAMUSCULAR | Status: AC
  Filled 2024-11-16: qty 5

## 2024-11-16 MED ORDER — ETOMIDATE 2 MG/ML IV SOLN
INTRAVENOUS | Status: AC
  Filled 2024-11-16: qty 10

## 2024-11-16 MED ORDER — POLYETHYLENE GLYCOL 3350 17 G PO PACK: 17.0000 g | PACK | Freq: Every day | ORAL | Status: DC

## 2024-11-16 MED ORDER — INSULIN ASPART 100 UNIT/ML IJ SOLN
0.0000 [IU] | INTRAMUSCULAR | Status: DC
  Administered 2024-11-16: 1 [IU] via SUBCUTANEOUS
  Filled 2024-11-16: qty 1

## 2024-11-16 MED ORDER — VASOPRESSIN 20 UNITS/100 ML INFUSION FOR SHOCK
0.0000 [IU]/min | INTRAVENOUS | Status: DC
  Administered 2024-11-16 (×2): 0.04 [IU]/min via INTRAVENOUS
  Administered 2024-11-16: 0.03 [IU]/min via INTRAVENOUS
  Administered 2024-11-17 (×2): 0.04 [IU]/min via INTRAVENOUS
  Filled 2024-11-16 (×6): qty 100

## 2024-11-16 MED ORDER — DOCUSATE SODIUM 50 MG/5ML PO LIQD: 100.0000 mg | Freq: Two times a day (BID) | ORAL | Status: DC

## 2024-11-16 MED ORDER — ALBUMIN HUMAN 25 % IV SOLN
25.0000 g | Freq: Once | INTRAVENOUS | Status: AC
  Administered 2024-11-16: 25 g via INTRAVENOUS
  Filled 2024-11-16: qty 100

## 2024-11-16 MED ORDER — PIPERACILLIN-TAZOBACTAM 3.375 G IVPB
3.3750 g | Freq: Three times a day (TID) | INTRAVENOUS | Status: DC
  Administered 2024-11-16 – 2024-11-17 (×4): 3.375 g via INTRAVENOUS
  Filled 2024-11-16 (×5): qty 50

## 2024-11-16 MED ORDER — PROPOFOL 1000 MG/100ML IV EMUL
0.0000 ug/kg/min | INTRAVENOUS | Status: DC
  Administered 2024-11-16: 10 ug/kg/min via INTRAVENOUS
  Administered 2024-11-17: 20 ug/kg/min via INTRAVENOUS

## 2024-11-16 MED ORDER — SUCCINYLCHOLINE CHLORIDE 200 MG/10ML IV SOSY
PREFILLED_SYRINGE | INTRAVENOUS | Status: AC
  Filled 2024-11-16: qty 10

## 2024-11-16 MED ORDER — ROCURONIUM BROMIDE 10 MG/ML (PF) SYRINGE
PREFILLED_SYRINGE | INTRAVENOUS | Status: AC
  Filled 2024-11-16: qty 10

## 2024-11-16 MED ORDER — PROPOFOL 1000 MG/100ML IV EMUL
INTRAVENOUS | Status: AC
  Filled 2024-11-16: qty 100

## 2024-11-16 MED ORDER — SODIUM CHLORIDE 0.9% IV SOLUTION: Freq: Once | INTRAVENOUS | Status: AC

## 2024-11-16 MED ORDER — LACTATED RINGERS IV BOLUS
500.0000 mL | Freq: Once | INTRAVENOUS | Status: AC
  Administered 2024-11-16: 500 mL via INTRAVENOUS

## 2024-11-16 MED ORDER — ORAL CARE MOUTH RINSE
15.0000 mL | OROMUCOSAL | Status: DC
  Administered 2024-11-16 – 2024-11-17 (×15): 15 mL via OROMUCOSAL

## 2024-11-16 MED ORDER — CALCIUM CHLORIDE 10 % IV SOLN
1.0000 g | Freq: Once | INTRAVENOUS | Status: AC
  Administered 2024-11-16: 1 g via INTRAVENOUS
  Filled 2024-11-16: qty 10

## 2024-11-16 MED ORDER — THIAMINE HCL 100 MG/ML IJ SOLN
100.0000 mg | INTRAMUSCULAR | Status: DC
  Administered 2024-11-17: 100 mg via INTRAVENOUS
  Filled 2024-11-16: qty 2

## 2024-11-16 MED ORDER — VASOPRESSIN 20 UNIT/ML IV SOLN
INTRAVENOUS | Status: AC
  Filled 2024-11-16: qty 1

## 2024-11-16 MED ORDER — PHENYLEPHRINE 80 MCG/ML (10ML) SYRINGE FOR IV PUSH (FOR BLOOD PRESSURE SUPPORT)
PREFILLED_SYRINGE | INTRAVENOUS | Status: AC
  Filled 2024-11-16: qty 10

## 2024-11-16 MED ORDER — ACETAMINOPHEN 10 MG/ML IV SOLN
INTRAVENOUS | Status: AC
  Filled 2024-11-16: qty 100

## 2024-11-16 MED ORDER — THIAMINE HCL 100 MG/ML IJ SOLN
100.0000 mg | Freq: Once | INTRAMUSCULAR | Status: AC
  Administered 2024-11-16: 100 mg via INTRAVENOUS
  Filled 2024-11-16: qty 2

## 2024-11-16 MED ORDER — SODIUM BICARBONATE 8.4 % IV SOLN
100.0000 meq | Freq: Once | INTRAVENOUS | Status: AC
  Administered 2024-11-16: 100 meq via INTRAVENOUS
  Filled 2024-11-16: qty 100

## 2024-11-16 MED ORDER — FENTANYL BOLUS VIA INFUSION
25.0000 ug | INTRAVENOUS | Status: DC | PRN
  Administered 2024-11-16: 25 ug via INTRAVENOUS
  Administered 2024-11-16 – 2024-11-17 (×3): 50 ug via INTRAVENOUS

## 2024-11-16 MED ORDER — HEPARIN SODIUM (PORCINE) 5000 UNIT/ML IJ SOLN: 5000.0000 [IU] | Freq: Two times a day (BID) | INTRAMUSCULAR | Status: DC

## 2024-11-16 MED ORDER — MIDAZOLAM HCL (PF) 2 MG/2ML IJ SOLN
1.0000 mg | INTRAMUSCULAR | Status: DC | PRN
  Administered 2024-11-16: 2 mg via INTRAVENOUS
  Filled 2024-11-16: qty 2

## 2024-11-16 MED ORDER — FENTANYL CITRATE (PF) 50 MCG/ML IJ SOSY: 25.0000 ug | PREFILLED_SYRINGE | Freq: Once | INTRAMUSCULAR | Status: DC

## 2024-11-16 MED ORDER — ACETAMINOPHEN 10 MG/ML IV SOLN
INTRAVENOUS | Status: DC | PRN
  Administered 2024-11-16: 700 mg via INTRAVENOUS

## 2024-11-16 MED ORDER — TRAVASOL 10 % IV SOLN
INTRAVENOUS | Status: DC
  Filled 2024-11-16: qty 367.2

## 2024-11-16 MED ORDER — FENTANYL 2500MCG IN NS 250ML (10MCG/ML) PREMIX INFUSION
0.0000 ug/h | INTRAVENOUS | Status: DC
  Administered 2024-11-16: 25 ug/h via INTRAVENOUS
  Administered 2024-11-17: 75 ug/h via INTRAVENOUS
  Filled 2024-11-16 (×2): qty 250

## 2024-11-16 MED ORDER — ORAL CARE MOUTH RINSE: 15.0000 mL | OROMUCOSAL | Status: DC | PRN

## 2024-11-16 MED ORDER — STERILE WATER FOR INJECTION IV SOLN
INTRAVENOUS | Status: AC
  Filled 2024-11-16 (×2): qty 150
  Filled 2024-11-16 (×2): qty 1000

## 2024-11-16 NOTE — Plan of Care (Signed)
  Problem: Education: Goal: Knowledge of General Education information will improve Description: Including pain rating scale, medication(s)/side effects and non-pharmacologic comfort measures Outcome: Not Progressing   Problem: Health Behavior/Discharge Planning: Goal: Ability to manage health-related needs will improve Outcome: Not Progressing   Problem: Clinical Measurements: Goal: Ability to maintain clinical measurements within normal limits will improve Outcome: Not Progressing Goal: Will remain free from infection Outcome: Not Progressing Goal: Diagnostic test results will improve Outcome: Not Progressing Goal: Respiratory complications will improve Outcome: Not Progressing Goal: Cardiovascular complication will be avoided Outcome: Not Progressing   Problem: Activity: Goal: Risk for activity intolerance will decrease Outcome: Not Progressing   Problem: Nutrition: Goal: Adequate nutrition will be maintained Outcome: Not Progressing   Problem: Coping: Goal: Level of anxiety will decrease Outcome: Not Progressing   Problem: Elimination: Goal: Will not experience complications related to bowel motility Outcome: Not Progressing Goal: Will not experience complications related to urinary retention Outcome: Not Progressing   Problem: Pain Managment: Goal: General experience of comfort will improve and/or be controlled Outcome: Not Progressing   Problem: Safety: Goal: Ability to remain free from injury will improve Outcome: Not Progressing   Problem: Skin Integrity: Goal: Risk for impaired skin integrity will decrease Outcome: Not Progressing   Problem: Activity: Goal: Ability to tolerate increased activity will improve Outcome: Not Progressing   Problem: Respiratory: Goal: Ability to maintain a clear airway and adequate ventilation will improve Outcome: Not Progressing   Problem: Role Relationship: Goal: Method of communication will improve Outcome: Not  Progressing

## 2024-11-16 NOTE — Consult Note (Signed)
 PHARMACY - TOTAL PARENTERAL NUTRITION CONSULT NOTE   Indication: Massive bowel resection  Patient Measurements: Height: 5' 4.02 (162.6 cm) Weight: 49.3 kg (108 lb 11 oz) IBW/kg (Calculated) : 54.74 TPN AdjBW (KG): 53.3 Body mass index is 18.65 kg/m.  Assessment:  79 y/o F recent thickened bolus status post right Hemi colectomy 6 weeks ago with recurrent SBO x 2 afterwards, HTN, HLD, TIA admitted 11/09/24 with recurrent abdominal pain and distension. She underwent transverse hemicolectomy with primary anastomosis on 11/10/24. Post-op course complicated by septic shock and suspected intestinal ischemia and patient underwent re-opening of laparotomy with small bowel resection on 11/16/24. Pharmacy consulted for TPN for massive bowel resection.  Glucose / Insulin :  --Last Hgb A1c 5.5% --Patient received Decadron  peri-op --Start sSSI q4h Electrolytes:  --Mild hyperkalemia, hyperphosphatemia Renal:  --With post-op AKI, Scr 1.28 (BL ~ 0.7) Hepatic:  --LFTs significantly elevated, Tbili is normal, INR is 2.7 Intake / Output; MIVF:  --Isotonic bicarbonate gtt at 100 cc/hr. Has received several fluid boluses. +4.9L for the admission GI Imaging: 12/27 CTAP: High-grade small bowel obstruction 1/2 CTA abdomen pelvis: Concerning for bowel ischemia GI Surgeries / Procedures:  12/28 Transverse hemicolectomy with primary anastomosis 1/3 Re-opening of recent laparotomy with small bowel resection  Central access: CVC 11/15/24 TPN start date: 11/16/24  Nutritional Goals: Goal TPN rate is 65 mL/hr (provides 80 g of protein and 1581 kcals per day)  RD Assessment: Estimated Needs Total Energy Estimated Needs: 1400-1600kcal/day Total Protein Estimated Needs: 70-80g/day Total Fluid Estimated Needs: 1.4-1.6L/day  Current Nutrition:  NPO  Plan:  --Start TPN at 30 mL/hr at 1800, hold lipids for now on propofol  --Electrolytes in TPN: Na 28mEq/L, K 8mEq/L, Ca 82mEq/L, Mg 81mEq/L, and Phos 5mmol/L.  Cl:Ac 1:1 --Add standard MVI and trace elements to TPN --Initiate Sensitive q4h SSI and adjust as needed  --Continue MIVF as per PCCM team --Monitor TPN labs on Mon/Thurs, daily until stable  Marolyn KATHEE Mare 11/16/2024,9:04 AM

## 2024-11-16 NOTE — Progress Notes (Signed)
 Pt had a syncopal episode on the commode at end of shift, MD at bedside. RR called. NS bolus started. Pt had dark red blood in BM, orders placed for Hem. Orders for EKG and CT ordered. Pt transferred to Step Down.

## 2024-11-16 NOTE — Progress Notes (Signed)
 Patient ID: Anna Castaneda, female   DOB: [birth year], 79 y.o.   MRN: [id number]     SURGICAL PROGRESS NOTE   Hospital Day(s): 7.   Interval History: Patient seen and examined.  Patient critically ill.  She did not deteriorated clinically overnight.  She is still on Levophed  half dose and vasopressin .  She was responsive to painful stimuli.  Vital signs in last 24 hours: [min-max] current  Temp:  [94.1 F (34.5 C)-98.6 F (37 C)] 97.7 F (36.5 C) (01/03 0600) Pulse Rate:  [45-107] 90 (01/03 0600) Resp:  [10-25] 24 (01/03 0600) BP: (59-141)/(23-113) 108/36 (01/03 0600) SpO2:  [10 %-100 %] 100 % (01/03 0600) Arterial Line BP: (58-160)/(32-65) 140/58 (01/03 0600) FiO2 (%):  [40 %] 40 % (01/03 0448) Weight:  [49.3 kg] 49.3 kg (01/03 0500)     Height: 5' 4.02 (162.6 cm) Weight: 49.3 kg BMI (Calculated): 18.65   Physical Exam:  Constitutional: Critically ill, sedated on mechanical ventilation Respiratory: On mechanical ventilation Cardiovascular: Sinus tachycardia Gastrointestinal: ABThera in place, no distention  Labs:     Latest Ref Rng & Units 11/16/2024    2:12 AM 11/15/2024    7:21 PM 11/15/2024    8:11 AM  CBC  WBC 4.0 - 10.5 K/uL 5.2     Hemoglobin 12.0 - 15.0 g/dL 9.7  8.4  7.4   Hematocrit 36.0 - 46.0 % 30.8  27.0    Platelets 150 - 400 K/uL 135         Latest Ref Rng & Units 11/16/2024    2:12 AM 11/14/2024    4:42 AM 11/13/2024    5:27 AM  CMP  Glucose 70 - 99 mg/dL 791  869  87   BUN 8 - 23 mg/dL 27  29  28    Creatinine 0.44 - 1.00 mg/dL 8.71  9.25  9.33   Sodium 135 - 145 mmol/L 142  147  146   Potassium 3.5 - 5.1 mmol/L 5.2  3.6  4.1   Chloride 98 - 111 mmol/L 108  112  113   CO2 22 - 32 mmol/L 12  25  22    Calcium  8.9 - 10.3 mg/dL 7.1  7.7  7.6   Total Protein 6.5 - 8.1 g/dL 3.6     Total Bilirubin 0.0 - 1.2 mg/dL 0.7     Alkaline Phos 38 - 126 U/L 75     AST 15 - 41 U/L >7,000     ALT 0 - 44 U/L 6,763       Assessment/Plan:  79 y.o. female with   Intestinal  ischemia - Patient with septic shock due to intestinal ischemia. - Presentation with a abrupt clinical change starting about 6 PM yesterday.  Very confusing clinical scenario, patient has never presented with leukocytosis and on physical exam other than her usual distention she did not have presented with sign of peritonitis.  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  Once the volvulus was reduced some of the intestine was showing sign of return of blood flow. - Patient left with her GI in discontinuity with open abdomen planning on second look in the next 24 hours - Will continue aggressive IV fluid resuscitation to optimize her metabolic acidosis. - NGT to low intermittent suction  Acute kidney injury with metabolic acidosis - Due to septic shock.  Continue aggressive IV fluid resuscitation  Pulmonary embolism - Right lower lobe segmental pulmonary embolism - No contraindication for heparin  drip  Acute respiratory failure - Continue mechanical ventilation support per CCM  Appreciate evaluation and management by CCM team   Lucas Petrin, MD

## 2024-11-16 NOTE — Transfer of Care (Signed)
 Immediate Anesthesia Transfer of Care Note  Patient: Anna Castaneda  Procedure(s) Performed: LAPAROTOMY, EXPLORATORY, small bowel resection  Patient Location: ICU  Anesthesia Type:General  Level of Consciousness: sedated and Patient remains intubated per anesthesia plan  Airway & Oxygen Therapy: Patient remains intubated per anesthesia plan and Patient placed on Ventilator (see vital sign flow sheet for setting)  Post-op Assessment: Report given to RN and Post -op Vital signs reviewed and stable  Post vital signs: Reviewed and stable  Last Vitals:  Vitals Value Taken Time  BP    Temp    Pulse    Resp    SpO2      Last Pain:  Vitals:   11/15/24 2150  TempSrc: Other (Comment)  PainSc: 0-No pain      Patients Stated Pain Goal: 0 (11/14/24 2044)  Complications: No notable events documented.

## 2024-11-16 NOTE — Progress Notes (Signed)
 "  NAME:  Anna Castaneda, MRN:  [id number], DOB:  1946/03/12, LOS: 7 ADMISSION DATE:  11/09/2024  History of Present Illness:  Anna Castaneda is a 79 y.o. female with medical history significant of recent thickened colon status post right hemi-colectomy 6 weeks ago with recurrent SBO x 2 afterwards, HTN, HLD, TIA, presented with recurrent abdominal pain and distention on 12/27.    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  At that time she had a right hemicolectomy with primary anastomosis.  She recovered well from this and was discharged home a few days later.  On December 10th she presented back to the hospital with abdominal distention and a CT scan was concerning for small bowel obstruction.  She had NG tube decompression at that time and started to have return of bowel function and was able to tolerate a diet and was discharged.     Symptoms began 11/14/24 prompting arrival to [facility] ED, which included cramps with abdominal pain and distention.  Most recent normal bowel movement was the morning of 11/14/24.  Cramping-like pain became constant and she decided to come to [facility] ED for evaluation.  Patient denied any chest pain shortness of breath, no fever or chills.   ED Course:  Vitals on Arrival: Temp 97.5, HR 79 BP 138/77, RR 22, O2/99% on room air.  Medications Administered: Morphine  and Zofran  Pertinent Labs/Diagnostics: Na 129, Glucose 166, Lipase 110, Lactic 2.1, Plts 447, WBC 6.1, Hgb: 13.3 12/27 CT abdomen/pelvis: high-grade small bowel obstruction   Admitted to the floor; General surgery following.   Hospital Course: See below under significant hospital events   Most Recent Pertinent Labs/Diagnostics (1/2): ABG: ph 7.06, pCO2 43, pO2 199, Bicarb 12.2 Lactic Acid: >9 Hgb: 8.4 >> 9.7 Platelets: 345>>135 INR: 2.7 Potassium: 5.2 Serum CO2: 12 AG: 23 Mg:  2.6 Phos: 10.2 Glucose: 208 Cr: 1.28 AST: >7000 ALT: 6763 Troponin: 21 >> 85 WBC: 5.2   Cta Abd/Pelvis Impression -  VASCULAR 1. Negative for aortic dissection or aneurysm. No acute occlusive disease. 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 2. No gross intraluminal extravasation of contrast to suggest active GI bleed within non-opacified bowel, however note that there is intraluminal contrast present within bowel loops and these areas cannot be adequately assessed for the presence of GI bleeding.   NON-VASCULAR 1. Limited assessment of the surgical anatomy secondary to diffuse fluid-filled small bowel, lack of soft tissue contrast and the presence of ascites and edema. Multiple fluid-filled suspected hypoenhancing small bowel in the abdomen and pelvis. Peripheral distribution of gas within some of the left lower quadrant small bowel raising concern for ischemia. Source of dilated small bowel is unclear but there is some disorganized appearance of mesenteric vessels on the left side of the abdomen raising possibility of a hernia or partial volvulus. 2. Moderate free intraperitoneal gas and moderate volume of ascites. The quantity of gas and fluid is greater than that would be expected for postoperative gas and perforated viscus is a concern. 3. Small filling defects within right lower lobe subsegmental vessels consistent with acute pulmonary embolus.   Significant Hospital Events: Including procedures, antibiotic start and stop dates in addition to other pertinent events   11/09/24: Presented to [facility] ED with concern for abdominal pain and distention. Imaging revealed SBO, General Surgery was consulted but did not think surgical intervention was needed at that time. NG  tube was placed to low continuous suction. 11/10/24: Evaluated by Dr. Cesar; CT revealed more concern for obstruction. Scheduled for Exploratory Laparotomy 11/11/24: Post Op day 1, no acute events; HD  stable.  11/12/24: Post Op day 2, patient without pain and no further complications. No acute events overnight. No bowel movement at that time. HD stable but intermittently agitated. KUB showed contrast in colon; plan to clamp NGT.  11/13/24: Bloody bowel movement without sx of dizziness, abd pain, n/v. Hgb declined from 10.9 to 7.6. Nausea reported in the afternoon; NGT back to suction. 11/14/24: No acute events overnight. Reported increased abdominal distention without n/v. Denied worsening abdominal pain. Mildly agitated. HD stable. 11/15/24: Evaluated in the am without overnight events and was HD stable. She appeared frustrated d/t lack of GI function but denied any pain. Mild nausea, NGT still at suction. Small bloody output rectally. Surgery called to evaluate the pt d/t significant change in clinical status with pain 10/10. She wanted help to the bathroom and had a BM with dark red blood, resulting in a near syncopal event. BP began to decline with MAPs in the mid-high 60s and was tachycardic. 1L of NS given; BP slightly increased. Persistent hypotension with worsening encephalopathy requiring PCCM consult for vasopressors; CVC and arterial line placed. 2 units pRBCs ordered. Plan for Dr. Cintron to take her back to the OR given recent Abd/Pelvis CT findings with concern for anastomosis leak vs bowel ischemia. Post operatively was found to have acute intestinal ischemia.  Interim History / Subjective:  Patient remains intubated and sedated.  Objective    Blood pressure (!) 88/51, pulse 89, temperature 98.8 F (37.1 C), resp. rate (!) 24, height 5' 4.02 (1.626 m), weight 49.3 kg, SpO2 100%.    Vent Mode: PRVC FiO2 (%):  [35 %-40 %] 35 % Set Rate:  [14 bmp-24 bmp] 24 bmp Vt Set:  [420 mL] 420 mL PEEP:  [5 cmH20] 5 cmH20 Plateau Pressure:  [17 cmH20] 17 cmH20   Intake/Output Summary (Last 24 hours) at 11/16/2024 1636 Last data filed at 11/16/2024 1621 Gross per 24 hour  Intake 6917.3 ml  Output  1085 ml  Net 5832.3 ml   Filed Weights   11/09/24 0339 11/14/24 0500 11/16/24 0500  Weight: 53.3 kg 46.8 kg 49.3 kg    Examination: General: Intubated sedated HENT: Supple neck, reactive pupils Lungs: Coarse breath sounds bilaterally Cardiovascular: Normal S1, normal S2, regular rate and rhythm Abdomen: GI in discontinuity open abdomen Extremities: Warm well-perfused no edema  Labs and imaging were reviewed.  Assessment and Plan   # Septic shock secondary to his intestinal ischemia # Initially presented with large bowel obstruction status post partial colectomy on 12/28 # Now with volvulus status post relook on 01/03 with GI left and discontinued he had an open abdomen was plan to proceed with another look on 01/0/2026 # Acute right lower lobe segmental PE # Acute hypoxic respiratory failure requiring intubation and mechanical ventilation on 01/03 # Oliguric AKI likely in the setting of ATN  # Lactic acidosis in the setting of the above # Shock liver in the setting of the above  Neuro: Propofol  and fentanyl  for analgosedation. RASS -2- -3. CVS: NE and Vaso for MAP > . 1L LR.  Lungs: LPV 6-8 cc/kg IBW, SpO2 greater than 90%. GI: NPO.  NGT to suction.  IV PPI for prophylaxis.  Plan for relook tomorrow. Heme: H&H every 12.  Transfuse for hemoglobin greater than 7 g/dL.  Transfuse for platelet  greater than 50.  Holding on heparin  drip given acute anemia. Renal: Monitor UOP.  Resuscitation as above.  Avoid nephrotoxic agents.  Will likely need renal consult in the morning. Endo: POC 140-180 ID: c/w zosyn  for septic shock in the setting of bowel ischemia.   Critical Care time: 60 minutes  Darrin Barn, MD [facility] 11/16/2024 4:52 PM        "

## 2024-11-16 NOTE — Plan of Care (Signed)
   Problem: Coping: Goal: Level of anxiety will decrease Outcome: Progressing   Problem: Pain Managment: Goal: General experience of comfort will improve and/or be controlled Outcome: Progressing

## 2024-11-16 NOTE — Progress Notes (Signed)
 eLink Physician-Brief Progress Note Patient Name: Anna Castaneda DOB: 1946-07-09 MRN: [id number]   Date of Service  11/16/2024  HPI/Events of Note  21F with hx of recent right hemicolectomy with recurrent SBO x 2 who presents with abdominal pain on 12/27. S/p ex-lap 12/28. On 11/15/24 PCCM consulted for hypotension. Patient returned to OR and found with acute intestinal ischemia requiring re-opening of recent laparotomy with small bowel resection. Returned to the ICU on mechanical ventilation. On levophed  38 and vasopressin   eICU Interventions  Post op care per surgery Post op vent management per CCM     CXR and ABG PRN. ABG with improved pH Trend LA Wean vasopressor support for goal >65 Bicarb gtt         Tim Corriher Slater Staff 11/16/2024, 5:46 AM

## 2024-11-16 NOTE — Progress Notes (Signed)
 Pt transferred from [facility]. Pt transported to CT scan. Pt brought up to ICU. Initial vitals were stable.   At 2108 MD Cintron-Diaz notified of change in pt's condition. BP was 72/48 with a map of 56.  Provider responded to bedside and informed Pt would be going to OR.   PRBCs hung per order. Levo initiated at 2130. Titrated per NP Britton-Lee at bedside.  Central line and R Femoral Aline placed by providers.  Pt transported to OR with OR staff.

## 2024-11-16 NOTE — Progress Notes (Addendum)
 Brief Nutrition Support Note  Received consult for initiation of TPN. Patient being followed by RD, last seen yesterday. She is s/p re-opening of recent laparotomy with small bowel resection overnight. Patient was left in discontinuity with open abdomen and plan for a second look in 24 hours. NGT remains to LIS. Surgery wanting to start TPN.    Latest Reference Range & Units 11/16/24 02:12  Potassium 3.5 - 5.1 mmol/L 5.2 (H)  Glucose 70 - 99 mg/dL 791 (H)  BUN 8 - 23 mg/dL 27 (H)  Creatinine 9.55 - 1.00 mg/dL 8.71 (H)  Phosphorus 2.5 - 4.6 mg/dL 89.7 (H)  Magnesium 1.7 - 2.4 mg/dL 2.6 (H)    INTERVENTION:  - TPN to be initiated today.   - TPN management per pharmacy.   - Pt is at high refeed risk; recommend monitor potassium, magnesium and phosphorus labs daily until stable. - Continue thiamine  100mg  IV daily x 5-7 days.    - Discontinue [employer] po BID while patient in discontinuity.   - Daily weights    Anna Castaneda Debby RD, LDN Contact via Secure Chat.

## 2024-11-16 NOTE — Progress Notes (Signed)
 Pharmacy Antibiotic Note  Anna Castaneda is a 79 y.o. female admitted on 11/09/2024 with intra-abdominal infection.  Pharmacy has been consulted for Zosyn  dosing.  Plan: Zosyn  3.375g IV q8h (4 hour infusion).  Height: 5' 4 (162.6 cm) Weight: 46.8 kg (103 lb 3.2 oz) IBW/kg (Calculated) : 54.7  Temp (24hrs), Avg:97.5 F (36.4 C), Min:97 F (36.1 C), Max:98.6 F (37 C)  Recent Labs  Lab 11/09/24 0348 11/09/24 0653 11/10/24 0439 11/11/24 0609 11/13/24 0527 11/14/24 0442  WBC 6.1  --  8.5 6.1 7.1 6.9  CREATININE 0.86  --  0.82 0.68 0.66 0.74  LATICACIDVEN 2.1* 0.9  --   --   --   --     Estimated Creatinine Clearance: 42.8 mL/min (by C-G formula based on SCr of 0.74 mg/dL).    Allergies[1]  Antimicrobials this admission:   >>    >>   Dose adjustments this admission:   Microbiology results:  BCx:   UCx:    Sputum:    MRSA PCR:   Thank you for allowing pharmacy to be a part of this patients care.  Seini Lannom D 11/16/2024 1:37 AM     [1] No Known Allergies

## 2024-11-16 NOTE — Op Note (Signed)
 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  Procedure: Re-opening of recent laparotomy with small bowel resection.   Anesthesia: GETA  Surgeon: Dr. Rodolph, MD   Indications: Patient is a 79 y.o. female with signs and symptoms of abdominal distention, hypotension, tachycardia not responding to aggressive resuscitation. Despite 2 liter of 0.9 normal saline, two units of PRCB and started on vasopressors, patient continued clinical deterioration. CT scan not conclusive of etiology of shock, but ruled out bleeding and was suspicious of intestinal ischemia.  Emergent exploratory laparotomy was indicated.   Findings: Initial concern of ischemia of all small intestine  After reduction of volvulus, about half of the small intestine started to show sign of revascularization. Decision was to resect the ileocolonic anastomosis at the site of the small intestine which was grossly and obviously ischemic and leave intestine in discontinuity to come back for a second look. No intestinal perforation identified.   Description of procedure: The patient was placed in the supine position and general endotracheal anesthesia was induced. A timeout was completed verifying correct patient, procedure, site, positioning, and implant(s) and/or special equipment prior to beginning this procedure. Preoperative antibiotics were given. A Foley catheter and nasogastric tube were placed. The abdomen was prepped and draped in the usual sterile fashion. After a timeout was performed, the skin staples were removed and the previous midline incision was reopened. The peritoneal cavity entered.  Abundant amount of ascites was aspirated  The initial impression was that almost 90% of the small intestine was ischemic.  There was severe small intestine dilation.  I will need to include multiple enterotomies for suctioning  intraluminal content of the small intestine to be able to inspect the small intestine in a organized matter.  Once I suctioned the intraluminal content I was able to reduce the volvulus to the normal position.  I then proceeded to do a small bowel resection of the neoterminal ileum at the ileocolonic anastomosis.  I divided the large small intestine with GIA and ligated and divided the mesentery with LigaSure device.  This again helped me to inspect the small intestine from ileum to ligament of Treitz.  I irrigated the abdominal cavity with 11 L of warm normal saline until clear water  was suctioned.  At this moment about 50% of the started to show some sign of revascularization with gross pink color changes.  Still it did not look like it continues segment so I decided to leave the intestine in discontinuity and come back for a second look.  Temporary closure of the abdominal wall was achieved with negative pressure dressing (DME) covering a 10 cm x 20 cm (200 cm area).   Specimen: Ileocolonic anastomosis  Complications: None  Estimated Blood Loss: 50 mL

## 2024-11-17 ENCOUNTER — Encounter: Admission: EM | Disposition: E | Payer: Self-pay | Source: Home / Self Care | Attending: Internal Medicine

## 2024-11-17 ENCOUNTER — Inpatient Hospital Stay: Admitting: Certified Registered"

## 2024-11-17 DIAGNOSIS — R6521 Severe sepsis with septic shock: Secondary | ICD-10-CM

## 2024-11-17 DIAGNOSIS — Z515 Encounter for palliative care: Secondary | ICD-10-CM

## 2024-11-17 DIAGNOSIS — Z7189 Other specified counseling: Secondary | ICD-10-CM

## 2024-11-17 DIAGNOSIS — K559 Vascular disorder of intestine, unspecified: Secondary | ICD-10-CM | POA: Diagnosis not present

## 2024-11-17 DIAGNOSIS — K56601 Complete intestinal obstruction, unspecified as to cause: Secondary | ICD-10-CM | POA: Diagnosis not present

## 2024-11-17 DIAGNOSIS — A419 Sepsis, unspecified organism: Secondary | ICD-10-CM | POA: Diagnosis not present

## 2024-11-17 DIAGNOSIS — K56609 Unspecified intestinal obstruction, unspecified as to partial versus complete obstruction: Secondary | ICD-10-CM | POA: Diagnosis not present

## 2024-11-17 DIAGNOSIS — N179 Acute kidney failure, unspecified: Secondary | ICD-10-CM

## 2024-11-17 HISTORY — PX: LAPAROTOMY: SHX154

## 2024-11-17 HISTORY — PX: BOWEL RESECTION: SHX1257

## 2024-11-17 LAB — BPAM FFP
Blood Product Expiration Date: 202601082359
ISSUE DATE / TIME: 202601030415
Unit Type and Rh: 8400

## 2024-11-17 LAB — COMPREHENSIVE METABOLIC PANEL WITH GFR
ALT: 4349 U/L — ABNORMAL HIGH (ref 0–44)
AST: >7000 U/L — ABNORMAL HIGH (ref 15–41)
Albumin: 2.1 g/dL — ABNORMAL LOW (ref 3.5–5.0)
Alkaline Phosphatase: 113 U/L (ref 38–126)
Anion gap: 19 — ABNORMAL HIGH (ref 5–15)
BUN: 51 mg/dL — ABNORMAL HIGH (ref 8–23)
CO2: 23 mmol/L (ref 22–32)
Calcium: 6.1 mg/dL — CL (ref 8.9–10.3)
Chloride: 99 mmol/L (ref 98–111)
Creatinine, Ser: 2.94 mg/dL — ABNORMAL HIGH (ref 0.44–1.00)
GFR, Estimated: 16 mL/min — ABNORMAL LOW
Glucose, Bld: 124 mg/dL — ABNORMAL HIGH (ref 70–99)
Potassium: 4.7 mmol/L (ref 3.5–5.1)
Sodium: 141 mmol/L (ref 135–145)
Total Bilirubin: 1.1 mg/dL (ref 0.0–1.2)
Total Protein: 3.2 g/dL — ABNORMAL LOW (ref 6.5–8.1)

## 2024-11-17 LAB — PREPARE RBC (CROSSMATCH)

## 2024-11-17 LAB — BASIC METABOLIC PANEL WITH GFR
Anion gap: 19 — ABNORMAL HIGH (ref 5–15)
BUN: 51 mg/dL — ABNORMAL HIGH (ref 8–23)
CO2: 23 mmol/L (ref 22–32)
Calcium: 6.4 mg/dL — CL (ref 8.9–10.3)
Chloride: 99 mmol/L (ref 98–111)
Creatinine, Ser: 3.13 mg/dL — ABNORMAL HIGH (ref 0.44–1.00)
GFR, Estimated: 15 mL/min — ABNORMAL LOW
Glucose, Bld: 117 mg/dL — ABNORMAL HIGH (ref 70–99)
Potassium: 4.7 mmol/L (ref 3.5–5.1)
Sodium: 140 mmol/L (ref 135–145)

## 2024-11-17 LAB — GLUCOSE, CAPILLARY
Glucose-Capillary: 85 mg/dL (ref 70–99)
Glucose-Capillary: 120 mg/dL — ABNORMAL HIGH (ref 70–99)
Glucose-Capillary: 53 mg/dL — ABNORMAL LOW (ref 70–99)

## 2024-11-17 LAB — PROTIME-INR
INR: 5.1 (ref 0.8–1.2)
Prothrombin Time: 49.4 s — ABNORMAL HIGH (ref 11.4–15.2)

## 2024-11-17 LAB — CBC
HCT: 22.5 % — ABNORMAL LOW (ref 36.0–46.0)
HCT: 17.3 % — ABNORMAL LOW (ref 36.0–46.0)
Hemoglobin: 7.7 g/dL — ABNORMAL LOW (ref 12.0–15.0)
Hemoglobin: 5.8 g/dL — ABNORMAL LOW (ref 12.0–15.0)
MCH: 31.7 pg (ref 26.0–34.0)
MCH: 31.5 pg (ref 26.0–34.0)
MCHC: 34.2 g/dL (ref 30.0–36.0)
MCHC: 33.5 g/dL (ref 30.0–36.0)
MCV: 92.6 fL (ref 80.0–100.0)
MCV: 94 fL (ref 80.0–100.0)
Platelets: 127 K/uL — ABNORMAL LOW (ref 150–400)
Platelets: 102 K/uL — ABNORMAL LOW (ref 150–400)
RBC: 2.43 MIL/uL — ABNORMAL LOW (ref 3.87–5.11)
RBC: 1.84 MIL/uL — ABNORMAL LOW (ref 3.87–5.11)
RDW: 15.6 % — ABNORMAL HIGH (ref 11.5–15.5)
RDW: 15.9 % — ABNORMAL HIGH (ref 11.5–15.5)
WBC: 19.8 K/uL — ABNORMAL HIGH (ref 4.0–10.5)
WBC: 18.5 K/uL — ABNORMAL HIGH (ref 4.0–10.5)
nRBC: 7.6 % — ABNORMAL HIGH (ref 0.0–0.2)
nRBC: 10.7 % — ABNORMAL HIGH (ref 0.0–0.2)

## 2024-11-17 LAB — HEPATIC FUNCTION PANEL
ALT: 2566 U/L — ABNORMAL HIGH (ref 0–44)
AST: >7000 U/L — ABNORMAL HIGH (ref 15–41)
Albumin: 2.4 g/dL — ABNORMAL LOW (ref 3.5–5.0)
Alkaline Phosphatase: 105 U/L (ref 38–126)
Bilirubin, Direct: 0.9 mg/dL — ABNORMAL HIGH (ref 0.0–0.2)
Indirect Bilirubin: 0.3 mg/dL (ref 0.3–0.9)
Total Bilirubin: 1.2 mg/dL (ref 0.0–1.2)
Total Protein: 3.2 g/dL — ABNORMAL LOW (ref 6.5–8.1)

## 2024-11-17 LAB — PREPARE FRESH FROZEN PLASMA

## 2024-11-17 LAB — LACTIC ACID, PLASMA: Lactic Acid, Venous: 7.2 mmol/L (ref 0.5–1.9)

## 2024-11-17 LAB — TRIGLYCERIDES: Triglycerides: 47 mg/dL

## 2024-11-17 LAB — PHOSPHORUS: Phosphorus: 7 mg/dL — ABNORMAL HIGH (ref 2.5–4.6)

## 2024-11-17 LAB — MAGNESIUM: Magnesium: 2.2 mg/dL (ref 1.7–2.4)

## 2024-11-17 MED ORDER — ALBUMIN HUMAN 5 % IV SOLN: INTRAVENOUS | Status: DC | PRN

## 2024-11-17 MED ORDER — 0.9 % SODIUM CHLORIDE (POUR BTL) OPTIME
TOPICAL | Status: DC | PRN
  Administered 2024-11-17: 3000 mL

## 2024-11-17 MED ORDER — LACTATED RINGERS IV BOLUS
1000.0000 mL | Freq: Once | INTRAVENOUS | Status: AC
  Administered 2024-11-17: 1000 mL via INTRAVENOUS

## 2024-11-17 MED ORDER — INDOCYANINE GREEN 25 MG IJ SOLR
INTRAMUSCULAR | Status: DC | PRN
  Administered 2024-11-17: 7.5 mg via INTRAVENOUS

## 2024-11-17 MED ORDER — GLYCOPYRROLATE 1 MG PO TABS: 1.0000 mg | ORAL_TABLET | ORAL | Status: DC | PRN

## 2024-11-17 MED ORDER — SODIUM CHLORIDE 0.9% IV SOLUTION: Freq: Once | INTRAVENOUS | Status: AC

## 2024-11-17 MED ORDER — STERILE WATER FOR INJECTION IV SOLN
INTRAVENOUS | Status: DC
  Filled 2024-11-17: qty 150

## 2024-11-17 MED ORDER — DEXTROSE 50 % IV SOLN: 25.0000 g | INTRAVENOUS | Status: AC

## 2024-11-17 MED ORDER — ROCURONIUM BROMIDE 100 MG/10ML IV SOLN
INTRAVENOUS | Status: DC | PRN
  Administered 2024-11-17: 20 mg via INTRAVENOUS

## 2024-11-17 MED ORDER — FENTANYL BOLUS VIA INFUSION: 100.0000 ug | INTRAVENOUS | Status: DC | PRN

## 2024-11-17 MED ORDER — GLYCOPYRROLATE 0.2 MG/ML IJ SOLN: 0.2000 mg | INTRAMUSCULAR | Status: DC | PRN

## 2024-11-17 MED ORDER — PIPERACILLIN-TAZOBACTAM IN DEX 2-0.25 GM/50ML IV SOLN
2.2500 g | Freq: Three times a day (TID) | INTRAVENOUS | Status: DC
  Administered 2024-11-17: 2.25 g via INTRAVENOUS
  Filled 2024-11-17 (×2): qty 50

## 2024-11-17 MED ORDER — ACETAMINOPHEN 325 MG PO TABS: 650.0000 mg | ORAL_TABLET | Freq: Four times a day (QID) | ORAL | Status: DC | PRN

## 2024-11-17 MED ORDER — PROPOFOL 1000 MG/100ML IV EMUL
INTRAVENOUS | Status: AC
  Filled 2024-11-17: qty 100

## 2024-11-17 MED ORDER — PROPOFOL 10 MG/ML IV BOLUS
INTRAVENOUS | Status: DC | PRN
  Administered 2024-11-17: 80 ug/kg/min via INTRAVENOUS

## 2024-11-17 MED ORDER — MIDAZOLAM HCL (PF) 2 MG/2ML IJ SOLN
INTRAMUSCULAR | Status: DC | PRN
  Administered 2024-11-17: 2 mg via INTRAVENOUS

## 2024-11-17 MED ORDER — DEXTROSE 50 % IV SOLN
INTRAVENOUS | Status: AC
  Administered 2024-11-17: 25 g via INTRAVENOUS
  Filled 2024-11-17: qty 50

## 2024-11-17 MED ORDER — MIDAZOLAM HCL 2 MG/2ML IJ SOLN
INTRAMUSCULAR | Status: AC
  Filled 2024-11-17: qty 2

## 2024-11-17 MED ORDER — HYDROMORPHONE HCL 1 MG/ML IJ SOLN
INTRAMUSCULAR | Status: AC
  Filled 2024-11-17: qty 1

## 2024-11-17 MED ORDER — CALCIUM GLUCONATE-NACL 2-0.675 GM/100ML-% IV SOLN
2.0000 g | Freq: Once | INTRAVENOUS | Status: AC
  Administered 2024-11-17: 2000 mg via INTRAVENOUS
  Filled 2024-11-17: qty 100

## 2024-11-17 MED ORDER — TRAVASOL 10 % IV SOLN
INTRAVENOUS | Status: DC
  Filled 2024-11-17: qty 795.6

## 2024-11-17 MED ORDER — SODIUM CHLORIDE 0.9 % IV SOLN: INTRAVENOUS | Status: DC

## 2024-11-17 MED ORDER — POLYVINYL ALCOHOL 1.4 % OP SOLN: 1.0000 [drp] | Freq: Four times a day (QID) | OPHTHALMIC | Status: DC | PRN

## 2024-11-17 MED ORDER — FENTANYL 2500MCG IN NS 250ML (10MCG/ML) PREMIX INFUSION: 0.0000 ug/h | INTRAVENOUS | Status: DC

## 2024-11-17 MED ORDER — ACETAMINOPHEN 650 MG RE SUPP: 650.0000 mg | Freq: Four times a day (QID) | RECTAL | Status: DC | PRN

## 2024-11-17 MED ORDER — CHLORHEXIDINE GLUCONATE CLOTH 2 % EX PADS: 6.0000 | MEDICATED_PAD | Freq: Every day | CUTANEOUS | Status: DC

## 2024-11-17 MED ORDER — HALOPERIDOL LACTATE 5 MG/ML IJ SOLN: 2.5000 mg | INTRAMUSCULAR | Status: DC | PRN

## 2024-11-17 MED ORDER — SUGAMMADEX SODIUM 200 MG/2ML IV SOLN
INTRAVENOUS | Status: DC | PRN
  Administered 2024-11-17: 100 mg via INTRAVENOUS

## 2024-11-17 MED ORDER — CALCIUM GLUCONATE-NACL 1-0.675 GM/50ML-% IV SOLN: 1.0000 g | Freq: Once | INTRAVENOUS | Status: DC

## 2024-11-17 MED ORDER — SODIUM CHLORIDE 0.9 % IV SOLN: INTRAVENOUS | Status: DC | PRN

## 2024-11-17 MED ORDER — LACTATED RINGERS IV BOLUS
500.0000 mL | Freq: Once | INTRAVENOUS | Status: AC
  Administered 2024-11-17: 500 mL via INTRAVENOUS

## 2024-11-17 MED ORDER — FENTANYL BOLUS VIA INFUSION
25.0000 ug | INTRAVENOUS | Status: DC | PRN
  Administered 2024-11-17: 75 ug via INTRAVENOUS
  Administered 2024-11-17: 100 ug via INTRAVENOUS
  Administered 2024-11-17: 75 ug via INTRAVENOUS

## 2024-11-17 MED ORDER — VITAMIN K1 10 MG/ML IJ SOLN
10.0000 mg | Freq: Once | INTRAVENOUS | Status: AC
  Administered 2024-11-17: 10 mg via INTRAVENOUS
  Filled 2024-11-17: qty 1

## 2024-11-17 MED ORDER — PROPOFOL 10 MG/ML IV BOLUS
INTRAVENOUS | Status: AC
  Filled 2024-11-17: qty 20

## 2024-11-18 ENCOUNTER — Encounter: Payer: Self-pay | Admitting: General Surgery

## 2024-11-18 LAB — BPAM RBC
Blood Product Expiration Date: 202602022359
Blood Product Expiration Date: 202602012359
Blood Product Expiration Date: 202602012359
Blood Product Unit Number: 202602012359
ISSUE DATE / TIME: 202601022113
ISSUE DATE / TIME: 202601022228
ISSUE DATE / TIME: 202601041221
Unit Type and Rh: 202602012359
Unit Type and Rh: 202602022359
Unit Type and Rh: 6200
Unit Type and Rh: 6200
Unit Type and Rh: 6200
Unit Type and Rh: 6200

## 2024-11-18 LAB — PREPARE FRESH FROZEN PLASMA
Unit division: 0
Unit division: 0

## 2024-11-18 LAB — BPAM FFP
Blood Product Expiration Date: 202601092359
Blood Product Expiration Date: 202601092359
Unit Type and Rh: 8400
Unit Type and Rh: 8400

## 2024-11-18 LAB — TYPE AND SCREEN
ABO/RH(D): AB POS
Antibody Screen: NEGATIVE
Unit division: 0
Unit division: 0
Unit division: 0
Unit division: 0

## 2024-11-18 LAB — PREPARE RBC (CROSSMATCH)

## 2024-11-19 LAB — SURGICAL PATHOLOGY

## 2024-12-15 NOTE — Op Note (Signed)
 Preoperative diagnosis: Intestinal ischemia.   Postoperative diagnosis: Intestinal ischemia.  Procedure: Reopening of recent laparotomy with small bowel resection.   Anesthesia: GETA  Surgeon: Dr. Rodolph, MD  Indications: Patient is a 79 y.o. female with recent episode of small bowel volvulus with extensive small bowel ischemia.  She was taken back today for second look reassessment of intestinal ischemia.  Patient still critically ill, on multiorgan failure.  Findings: Number 160 cm of small intestine with at least some ICG perfusion From this 160 cm, about 60 cm was grossly ischemic with another 100 cc of non gross ischemic appearance 265 cm of small intestine resected due to gross necrosis and nonvisualization of ICG green perfusion Grossly viable large intestine       Description of procedure: The patient arrived directly from the ICU, already intubated and sedated and she was placed in the supine position and general endotracheal anesthesia was induced. A timeout was completed verifying correct patient, procedure, site, positioning, and implant(s) and/or special equipment prior to beginning this procedure. Preoperative antibiotics were given. A Foley catheter and nasogastric tube already were in place. The abdomen was prepped with Betadine and draped in the usual sterile fashion. After a timeout was performed, the ABThera negative pressure dressing was removed and the peritoneal cavity entered.  No intestinal spillage or purulent was identified.  Gross evaluation of the small intestine shows more than half of the small intestine with grossly necrotic changes.  I proceeded to perform firefly with ICG green for further evaluation of blood flow to the portion of the small intestine.  As shown in the picture above, the initial 160 cm showed blood flow up to the small intestine.  Only the first 10 cm showed grade perfusion to the small intestine wall.  The rest was just enough to light  up up to the mesenteric border of the small intestine.  Due to her clinical status and knowing that I was not going to perform a final anastomosis or ileostomy at this moment, I decided to do a small bowel resection of the grossly necrotic and no ICG perfusion segment which was about 265 cm.  With the GIA I divided the small intestine and with LigaSure I divided the mesentery.  Small intestine was left in discontinuity.  I irrigated the abdominal cavity with 3 L of warm saline. After the sponge needle and instrument count was correct, I applied the ABThera negative pressure dressing (DME to cover again a 10 cm x 20 cm (200 cm area).  The patient tolerated the procedure well and was taken to the ICU in critical condition.   Specimen: Small bowel  Complications: None  Estimated Blood Loss: 100 mL

## 2024-12-15 NOTE — Progress Notes (Signed)
 MD aware of pt's hgb 5.1. See new orders.  Pt's 1200 CBG 53. Per protocol, dextrose  administered. Recheck CBG 120. MD aware.  Pt's temperature hypothermic despite warm blankets. Pt placed on bair hugger. MD aware.

## 2024-12-15 NOTE — Progress Notes (Signed)
 Patient ID: Anna Castaneda, female   DOB: 11/20/1945, 79 y.o.   MRN: [id number]  Patient returned from the OR after reopening of recent laparotomy with small bowel resection.  I had a long discussion with [name], about the patient's current clinical status.  In summary described that patient is currently on multiorgan failure including circulatory shock, respiratory failure, acute liver failure, acute kidney failure due to a recent ischemic event.  Patient currently on supportive treatment with mechanical ventilator, vasopressors with Levophed  and vasopressin , IV fluids, TPN.  I discussed with niece about short-term goals of critical care/supportive management to see how much of her multiple organ failures respond and recovers.  Depending on this response, patient will need to come back to the operation room for reassessment of intestinal ischemia and possible more bowel resection.  Discussed with niece about the possible need of hemodialysis, multiple drains like feeding tubes, catheters among others.  Also discuss that at least the patient will need an ileostomy.  I also mention longer-term expectation if patient gets to the point just to have a bigger picture about the possibility of developing short-bowel syndrome.  At this time needs wanted to process all this information, review patient's will discuss with other family members about how to proceed with next steps.  I also recommended palliative care evaluation for further discussion of patient's goal.  Case discussed with CCM team  Lucas Sjogren, MD

## 2024-12-15 NOTE — Progress Notes (Signed)
 OR staff at bedside to take patient to procedure.

## 2024-12-15 NOTE — Consult Note (Signed)
 [facility] Consult Note: 11/21/24    Date of Admission:  11/09/2024           Reason for Consult: AKI    Referring Provider: Malka Domino, MD Primary Care Provider: Sadie Manna, MD   History of Presenting Illness:  Anna Castaneda is a 79 y.o. 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 CT abdomen pelvis with contrast on 10/23/2024 showed mildly dilated large and small bowel loops suggesting ileus or obstruction.  Repeat CT on 11/09/2024 showed high-grade small bowel obstruction with dilated stomach and small bowel loops up to 3.7 cm.  Patient had another CT angiogram which raise concern for ongoing ischemia of the small bowel.  Patient underwent multiple surgeries.  11/10/2024-transverse and colectomy with primary anastomosis 11/16/2024-exploratory laparotomy, small bowel resection with findings of 90% of small intestine being ischemic.  2024-11-21-reopening of recent left bowel resection.  Patient is critically ill, intubated and sedated. She has an NG tube, OG tube.  She is currently ventilator dependent.  FiO2 35%/PEEP 5. Pressors-norepinephrine , vasopressin  Sedation Fentanyl  and propofol  She is getting TPN and IV sodium bicarbonate . Abdominal wound VAC. Foley catheter with small amount of urine   Review of Systems: ROS-not available due to patient's critical condition as noted above.  Past Medical History:  Diagnosis Date   Arthritis 2018   GERD (gastroesophageal reflux disease) 2005   Hyperlipidemia    Hypertension    Myocardial infarction (HCC) 02/01/2016   Broken Heart Syndrome   Osteoporosis 2018    Social History[1]  Family History  Problem Relation Age of Onset   Breast cancer Neg Hx      OBJECTIVE: Blood pressure (!)  81/64, pulse 68, temperature (!) 94.8 F (34.9 C), resp. rate (!) 24, height 5' 4.02 (1.626 m), weight 49.3 kg, SpO2 100%.  Physical Exam General Appearance-critically ill-appearing, laying in the bed HEENT-ET tube in place, NG tube in place Pulmonary-ventilator assisted Cardiac-tachycardic, regular Abdomen-wound VAC Extremities-some dependent edema Neuro-sedated Skin-warm, dry   Lab Results Lab Results  Component Value Date   WBC 18.5 (H) 2024-11-21   HGB 5.8 (L) 11/21/2024   HCT 17.3 (L) 11/21/2024   MCV 94.0 11-21-2024   PLT 102 (L) November 21, 2024    Lab Results  Component Value Date   CREATININE 3.13 (H) 11-21-2024   BUN 51 (H) 11/21/2024   NA 140 Nov 21, 2024   K 4.7 2024/11/21   CL 99 2024-11-21   CO2 23 11/21/24    Lab Results  Component Value Date   ALT 2,566 (H) 2024/11/21   AST >7,000 (H) 11-21-2024   ALKPHOS 105 Nov 21, 2024   BILITOT 1.2 11-21-24     Microbiology: Recent Results (from the past 240 hours)  MRSA Next Gen by PCR, Nasal     Status: None   Collection Time: 11/16/24  9:51 PM   Specimen: Nasal Mucosa; Nasal Swab  Result Value Ref Range Status   MRSA by PCR Next Gen NOT DETECTED NOT DETECTED Final    Comment: (NOTE) The GeneXpert MRSA Assay (FDA approved for NASAL specimens only), is one component of a comprehensive MRSA colonization surveillance program. It is not intended to diagnose MRSA infection nor to guide or monitor treatment for MRSA infections. Test performance is not FDA approved in patients less than 47 years old. Performed at [facility], [address] [place], [place] [address]     Medications: Scheduled Meds:  Chlorhexidine  Gluconate Cloth  6 each Topical Daily   docusate  100 mg Per Tube BID   fentaNYL  (SUBLIMAZE ) injection  25-50 mcg Intravenous Once   heparin  injection (subcutaneous)  5,000 Units Subcutaneous Q12H   insulin  aspart  0-9 Units Subcutaneous Q4H   mouth rinse  15 mL Mouth Rinse Q2H    pantoprazole  (PROTONIX ) IV  40 mg Intravenous Q24H   polyethylene glycol  17 g Per Tube Daily   thiamine  (VITAMIN B1) injection  100 mg Intravenous Q24H   Continuous Infusions:  fentaNYL  infusion INTRAVENOUS 75 mcg/hr (2024/12/15 1253)   norepinephrine  (LEVOPHED ) Adult infusion 7 mcg/min (12/15/2024 1253)   phytonadione  (VITAMIN K ) 10 mg in dextrose  5 % 50 mL IVPB     piperacillin -tazobactam (ZOSYN )  IV 2.25 g (Mar 31, 2025 1054)   propofol  (DIPRIVAN ) infusion 20 mcg/kg/min (12-15-2024 1253)   TPN ADULT (ION) 30 mL/hr at Dec 15, 2024 1253   TPN ADULT (ION)     vasopressin  0.04 Units/min (12/15/24 1253)   PRN Meds:.acetaminophen  **OR** acetaminophen , fentaNYL , midazolam  PF, mouth rinse  Allergies[2]  Urinalysis: No results for input(s): COLORURINE, LABSPEC, PHURINE, GLUCOSEU, HGBUR, BILIRUBINUR, KETONESUR, PROTEINUR, UROBILINOGEN, NITRITE, LEUKOCYTESUR in the last 72 hours.  Invalid input(s): APPERANCEUR    Imaging: CT Angio Abd/Pel w/ and/or w/o Result Date: 11/15/2024 CLINICAL DATA:  Concern for GI bleed, recent colectomy EXAM: CTA ABDOMEN AND PELVIS WITHOUT AND WITH CONTRAST TECHNIQUE: Multidetector CT imaging of the abdomen and pelvis was performed using the standard protocol during bolus administration of intravenous contrast. Multiplanar reconstructed images and MIPs were obtained and reviewed to evaluate the vascular anatomy. RADIATION DOSE REDUCTION: This exam was performed according to the departmental dose-optimization program which includes automated exposure control, adjustment of the mA and/or kV according to patient size and/or use of iterative reconstruction technique. 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 Abdominal aorta shows no evidence for dissection, aneurysm  or stenosis. 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 Distal patency. SMA: Patent without evidence of aneurysm, dissection, vasculitis or significant stenosis. Renals: Both renal arteries are patent without evidence of aneurysm, dissection, vasculitis, fibromuscular dysplasia or significant stenosis. IMA: Diminutive but grossly patent Inflow: Patent without evidence of aneurysm, dissection, vasculitis or significant stenosis. Proximal Outflow: Bilateral common femoral and visualized portions of the superficial and profunda femoral arteries are patent without evidence of aneurysm, dissection, vasculitis or significant stenosis. Veins: Patent portal and splenic veins but diminutive. Collapsed appearance of the IVC. Review of the MIP images confirms the above findings. NON-VASCULAR Lower chest: Lung bases demonstrate mucous plugging in the left lower lobe. No consolidation or pleural effusion. Fluid distension of the distal esophagus. Small filling defects within subsegmental right lower lobe pulmonary vessels concerning for emboli. Hepatobiliary: Heterogenous enhancement. No biliary dilatation. No calcified gallstones. Pancreas: Unremarkable. No pancreatic ductal dilatation or surrounding inflammatory changes. Spleen: Normal in size without focal abnormality. Adrenals/Urinary Tract: Adrenal glands are normal. Kidneys show no hydronephrosis. Atrophic left kidney with multiple cysts, no imaging follow-up recommended. Bladder is decompressed Stomach/Bowel: Enteric tube tip in the body of the stomach. Unusual orientation of the stomach which is low lying to the left lower quadrant. Assessment of the bowel is somewhat limited due to the presence of mesenteric fluid and edema. Per history, patient  is post partial right colectomy with small bowel colon anastomosis to the descending colon. Diffuse fluid-filled small bowel throughout. No bowel wall thickening. Bowel appears  diffusely hypoenhancing. Peripheral distribution of gas within left lower quadrant small bowel, for example series 13 image 48 through 65. No mesenteric venous gas. Suspect non dependent gas locules within dilated small bowel in the pelvis as well. Small foci of gas at the porta hepatis, probably free gas from recent surgery. Lymphatic: No grossly enlarged lymph nodes Reproductive: No adnexal mass Other: Moderate volume of free intraperitoneal air. Air-fluid level in the anterior right upper quadrant. Musculoskeletal: No acute osseous abnormality. IMPRESSION: VASCULAR 1. Negative for aortic dissection or aneurysm. No acute occlusive disease. 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 2. No gross intraluminal extravasation of contrast to suggest active GI bleed within non-opacified bowel, however note that there is intraluminal contrast present within bowel loops and these areas cannot be adequately assessed for the presence of GI bleeding. NON-VASCULAR 1. Limited assessment of the surgical anatomy secondary to diffuse fluid-filled small bowel, lack of soft tissue contrast and the presence of ascites and edema. Multiple fluid-filled suspected hypoenhancing small bowel in the abdomen and pelvis. Peripheral distribution of gas within some of the left lower quadrant small bowel raising concern for ischemia. Source of dilated small bowel is unclear but there is some disorganized appearance of mesenteric vessels on the left side of the abdomen raising possibility of a hernia or partial volvulus. 2. Moderate free intraperitoneal gas and moderate volume of ascites. The quantity of gas and fluid is greater than that would be expected for postoperative gas and perforated viscus is a concern. 3. Small filling defects within right lower lobe subsegmental vessels consistent with acute pulmonary embolus. 4. Critical Value/emergent results were called by telephone at the time of interpretation on 11/15/2024 at completion of  imaging to provider [facility] CINTRON-DIAZ , who verbally acknowledged these results. Electronically Signed   By: Luke Bun M.D.   On: 11/15/2024 22:12      Assessment/Plan:  Avaline Stillson is a 79 y.o. female with medical problems of hypertension, hyperlipidemia, osteoarthritis, hemicolectomy November 2025, small bowel obstruction status post exploratory laparotomy in January 2026    was admitted on 11/09/2024 for :  SBO (small bowel obstruction) (HCC) [K56.609] Complete intestinal obstruction, unspecified cause (HCC) [K56.601]  1.  Acute kidney injury.  Baseline creatinine 0.74 from 11/14/2024 2.  Acute respiratory failure, ventilator dependent 3.  Small bowel obstruction status post exploratory laparotomy, recent history of hemicolectomy 4.  Shock liver 5.  Hypotension  Patient has severe multiorgan failure.  She has oliguric acute kidney injury likely secondary to ATN from multiple IV contrast exposures and hypotensive insults.  Plan: At present discussions are ongoing with palliative care regarding goals of care. If aggressive care is desired by family, CRRT can be considered. Please notify nephrology team of the outcomes of the discussion.    Kordelia Severin 11-23-2024     [1]  Social History Tobacco Use   Smoking status: Former    Current packs/day: 0.00    Average packs/day: 1 pack/day for 15.0 years (15.0 ttl pk-yrs)    Types: Cigarettes    Start date: 04/23/1974    Quit date: 04/23/1989    Years since quitting: 35.5   Smokeless tobacco: Never   Tobacco comments:    smoker: 07/03/1974 - 04/23/1989  Vaping Use   Vaping status: Never Used  Substance Use Topics   Alcohol  use: Yes  Alcohol /week: 7.0 standard drinks of alcohol     Types: 7 Glasses of wine per week   Drug use: Never  [2] No Known Allergies

## 2024-12-15 NOTE — Consult Note (Signed)
 PHARMACY - TOTAL PARENTERAL NUTRITION CONSULT NOTE   Indication: Massive bowel resection  Patient Measurements: Height: 5' 4.02 (162.6 cm) Weight: 49.3 kg (108 lb 11 oz) IBW/kg (Calculated) : 54.74 TPN AdjBW (KG): 53.3 Body mass index is 18.65 kg/m.  Assessment:  79 y/o F recent thickened bolus status post right Hemi colectomy 6 weeks ago with recurrent SBO x 2 afterwards, HTN, HLD, TIA admitted 11/09/24 with recurrent abdominal pain and distension. She underwent transverse hemicolectomy with primary anastomosis on 11/10/24. Post-op course complicated by septic shock and suspected intestinal ischemia and patient underwent re-opening of laparotomy with small bowel resection on 11/16/24. Pharmacy consulted for TPN for massive bowel resection.  Glucose / Insulin :  --Last Hgb A1c 5.5% --CBGs on low side with one episode of mild hypoglycemia in last 24h --Continue sSSI q4h, 1u SSI last 24h Electrolytes:  --Resolved hyperkalemia, improved hyperphosphatemia Renal:  --With post-op AKI, Scr 2.94 (BL ~ 0.7) Hepatic:  --LFTs significantly elevated, Tbili is normal, INR is 2.7 Intake / Output; MIVF:  --Isotonic bicarbonate gtt at 100 cc/hr. Has received several fluid boluses. +4.9L for the admission GI Imaging: 12/27 CTAP: High-grade small bowel obstruction 1/2 CTA abdomen pelvis: Concerning for bowel ischemia GI Surgeries / Procedures:  12/28 Transverse hemicolectomy with primary anastomosis 1/3 Re-opening of recent laparotomy with small bowel resection  Central access: CVC 11/15/24 TPN start date: 11/16/24  Nutritional Goals: Goal TPN rate is 65 mL/hr (provides 80 g of protein and 1581 kcals per day)  RD Assessment: Estimated Needs Total Energy Estimated Needs: 1400-1600kcal/day Total Protein Estimated Needs: 70-80g/day Total Fluid Estimated Needs: 1.4-1.6L/day  Current Nutrition:  NPO Propofol  at 20 mcg/kg/min = ~ 150 Kcal from fat per day  Plan:  --Advance TPN to goal rate 65  mL/hr at 1800, continue to hold lipids today with propofol . May consider adding some lipids to bag tomorrow if propofol  rate remains low --Electrolytes in TPN: Na 60mEq/L, K 33mEq/L, Ca 65mEq/L, Mg 70mEq/L, and Phos 5mmol/L. Cl:Ac 1:1 --Add standard MVI and trace elements to TPN --Continue Sensitive q4h SSI and adjust as needed  --Continue MIVF as per PCCM team --Monitor TPN labs on Mon/Thurs, daily until stable  Anna Castaneda 11-Dec-2024,7:05 AM

## 2024-12-15 NOTE — Death Summary Note (Signed)
 " DEATH SUMMARY   Patient Details  Name: Anna Castaneda MRN: [id number] DOB: 05/25/1946  Admission/Discharge Information   Admit Date:  Dec 08, 2024  Date of Death: Date of Death: 2024/12/16  Time of Death: Time of Death: Dec 24, 1608  Length of Stay: 8  Referring Physician: Sadie Manna, MD   Reason(s) for Hospitalization    Diagnoses  Preliminary cause of death:  Secondary Diagnoses (including complications and co-morbidities):  Principal Problem:   SBO (small bowel obstruction) (HCC) Active Problems:   Large bowel obstruction [facility])   Brief Hospital Course (including significant findings, care, treatment, and services provided and events leading to death)  [name] is a 79 y.o. 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 She was taken emergebntly to the op room for a relook and resection of of the small bowel, she was left in discontinuity with an open abdomen. On 12-16-2024 she underwent another relook with significant small bowel ischemia and 265cm, of small intestine were resected. She was then transferred back to the ICU with an open abdomen and plan for a relook in 24 to 48 hours. However her clinical status was worsening with shock liver, oliguric AKI and refractory shock. Multiple discussions were held with her Niece by our palliative care team and surgical team and family elected to proceed with CMO. She passed away on 12-16-24 at 16:00.     Pertinent Labs and Studies  Significant Diagnostic Studies CT Angio Abd/Pel w/ and/or w/o Result Date: 11/15/2024 CLINICAL DATA:  Concern for GI bleed, recent colectomy EXAM: CTA  ABDOMEN AND PELVIS WITHOUT AND WITH CONTRAST TECHNIQUE: Multidetector CT imaging of the abdomen and pelvis was performed using the standard protocol during bolus administration of intravenous contrast. Multiplanar reconstructed images and MIPs were obtained and reviewed to evaluate the vascular anatomy. RADIATION DOSE REDUCTION: This exam was performed according to the departmental dose-optimization program which includes automated exposure control, adjustment of the mA and/or kV according to patient size and/or use of iterative reconstruction technique. 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 Abdominal aorta shows no evidence for dissection, aneurysm or stenosis. 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 Distal patency. SMA: Patent without evidence of aneurysm, dissection, vasculitis or significant stenosis. Renals: Both renal arteries are patent without evidence of aneurysm, dissection, vasculitis, fibromuscular dysplasia or significant stenosis. IMA: Diminutive but grossly patent Inflow: Patent without evidence of aneurysm, dissection, vasculitis or significant stenosis. Proximal Outflow: Bilateral common femoral and visualized portions of the superficial and profunda femoral arteries are patent without evidence of aneurysm, dissection, vasculitis or significant stenosis. Veins: Patent portal and splenic veins but diminutive. Collapsed appearance of the IVC. Review of the MIP images confirms the above findings. NON-VASCULAR Lower chest: Lung bases demonstrate mucous plugging in the left lower lobe. No consolidation or pleural effusion. Fluid distension of the distal esophagus. Small filling defects within subsegmental right lower lobe pulmonary vessels  concerning for emboli. Hepatobiliary: Heterogenous enhancement. No biliary dilatation. No calcified  gallstones. Pancreas: Unremarkable. No pancreatic ductal dilatation or surrounding inflammatory changes. Spleen: Normal in size without focal abnormality. Adrenals/Urinary Tract: Adrenal glands are normal. Kidneys show no hydronephrosis. Atrophic left kidney with multiple cysts, no imaging follow-up recommended. Bladder is decompressed Stomach/Bowel: Enteric tube tip in the body of the stomach. Unusual orientation of the stomach which is low lying to the left lower quadrant. Assessment of the bowel is somewhat limited due to the presence of mesenteric fluid and edema. Per history, patient is post partial right colectomy with small bowel colon anastomosis to the descending colon. Diffuse fluid-filled small bowel throughout. No bowel wall thickening. Bowel appears diffusely hypoenhancing. Peripheral distribution of gas within left lower quadrant small bowel, for example series 13 image 48 through 65. No mesenteric venous gas. Suspect non dependent gas locules within dilated small bowel in the pelvis as well. Small foci of gas at the porta hepatis, probably free gas from recent surgery. Lymphatic: No grossly enlarged lymph nodes Reproductive: No adnexal mass Other: Moderate volume of free intraperitoneal air. Air-fluid level in the anterior right upper quadrant. Musculoskeletal: No acute osseous abnormality. IMPRESSION: VASCULAR 1. Negative for aortic dissection or aneurysm. No acute occlusive disease. 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 2. No gross intraluminal extravasation of contrast to suggest active GI bleed within non-opacified bowel, however note that there is intraluminal contrast present within bowel loops and these areas cannot be adequately assessed for the presence of GI bleeding. NON-VASCULAR 1. Limited assessment of the surgical anatomy secondary to diffuse fluid-filled small bowel, lack of soft  tissue contrast and the presence of ascites and edema. Multiple fluid-filled suspected hypoenhancing small bowel in the abdomen and pelvis. Peripheral distribution of gas within some of the left lower quadrant small bowel raising concern for ischemia. Source of dilated small bowel is unclear but there is some disorganized appearance of mesenteric vessels on the left side of the abdomen raising possibility of a hernia or partial volvulus. 2. Moderate free intraperitoneal gas and moderate volume of ascites. The quantity of gas and fluid is greater than that would be expected for postoperative gas and perforated viscus is a concern. 3. Small filling defects within right lower lobe subsegmental vessels consistent with acute pulmonary embolus. 4. Critical Value/emergent results were called by telephone at the time of interpretation on 11/15/2024 at completion of imaging to provider [facility] CINTRON-DIAZ , who verbally acknowledged these results. Electronically Signed   By: Luke Bun M.D.   On: 11/15/2024 22:12   DG Abd Portable 1V-Small Bowel Obstruction Protocol-initial, 8 hr delay Result Date: 11/12/2024 EXAM: 1 VIEW XRAY OF THE ABDOMEN 11/11/2024 04:23:00 PM COMPARISON: Abdominal series dated 11/10/2024. CLINICAL HISTORY: Since the previous study, the patient has undergone laparotomy. FINDINGS: LINES, TUBES AND DEVICES: An enteric catheter is present with its tip along the greater curvature of the mid stomach. An additional catheter now projects over the left mid abdomen. BOWEL: There is enteric contrast present within the rectosigmoid colon and descending colon. Nonobstructive bowel gas pattern. SOFT TISSUES: Moderate pneumoperitoneum. No abnormal calcifications. BONES: Mild levoscoliosis of the lumbar spine. No acute fracture. IMPRESSION: 1. Moderate pneumoperitoneum, likely related to interval laparotomy. 2. Enteric contrast within the descending colon and rectosigmoid colon. 3. Enteric catheter tip along the  greater curvature of the mid stomach with an additional catheter projecting over the left mid abdomen. Electronically signed by: Evalene Coho MD 11/12/2024 07:24 AM EST RP Workstation: [id number]   DG Abd 2 Views Result Date: 11/12/2024 EXAM: 2 VIEW XRAY  OF THE ABDOMEN 11/12/2024 07:15:00 AM COMPARISON: KUB dated 11/11/2024. CLINICAL HISTORY: [id number] Ileus following gastrointestinal surgery (HCC) [id number] Ileus following gastrointestinal surgery (HCC) FINDINGS: LINES, TUBES AND DEVICES: An enteric catheter remains with its tip along the greater curvature of the stomach. The gastrostomy tube is also present. BOWEL: There is moderate pneumoperitoneum present, which is better demonstrated on the AP erect view. There is enteric contrast within the rectosigmoid colon. SOFT TISSUES: Skin staples are present just to the left of midline. No abnormal calcifications. BONES: No acute fracture. IMPRESSION: 1. Moderate pneumoperitoneum, better demonstrated on the AP erect view. Comparison: 11/11/2024. 2. Enteric catheter tip along the greater curvature of the stomach and gastrostomy tube in place. 3. Enteric contrast within the rectosigmoid colon. Electronically signed by: Evalene Coho MD 11/12/2024 07:21 AM EST RP Workstation: [id number]   DG Abd 2 Views Result Date: 11/10/2024 EXAM: 2 VIEW XRAY OF THE ABDOMEN 11/10/2024 07:42:00 AM COMPARISON: CT abdomen and pelvis 11/09/2024. CLINICAL HISTORY: 79 year old female with bowel obstruction and hepatocellular carcinoma. FINDINGS: LINES, TUBES AND DEVICES: Stable and satisfactory enteric tube placement. BOWEL: Similar diffuse gaseous dilation of bowel loops. Lucency in the upper abdomen appears stable from the CT abdomen and pelvis scout view yesterday, with dilated loops but no convincing pneumoperitoneum. Gas distended bowel loops continue throughout the abdomen and pelvis. Mechanical bowel obstruction was favored by CT. SOFT TISSUES: Surgical sutures in the left  abdomen. No abnormal calcifications. BONES: No acute fracture. IMPRESSION: 1. Ongoing extensive gas distended bowel loops throughout the abdomen and pelvis. No convincing pneumoperitoneum. Mechanical bowel obstruction favored on recent CT. 2. Stable Enteric tube. Electronically signed by: Helayne Hurst MD 11/10/2024 08:02 AM EST RP Workstation: [id number]   DG Abd Portable 1 View Result Date: 11/09/2024 EXAM: 1 VIEW XRAY OF THE ABDOMEN 11/09/2024 05:59:00 AM COMPARISON: CT abdomen and pelvis 11/09/2024 00:00:00 AM. Prior XR 10/25/2024. CLINICAL HISTORY: 79 year old female, post nasogastric tube placement. FINDINGS: LINES, TUBES AND DEVICES: Enteric tube in place with tip and side port projecting over the stomach in the left upper quadrant. BOWEL: Extensive gas lucency in the visible abdomen appears related to the gas distended bowel loops demonstrated on CT this morning and stable from the CT scout view. BONES/OTHER: Stable lung bases. No acute fracture. IMPRESSION: 1. Satisfactory enteric tube placement into the stomach. 2. Diffuse gaseous distention of bowel stable from CT 0418 hours today. Electronically signed by: Helayne Hurst MD 11/09/2024 06:09 AM EST RP Workstation: [id number]   CT ABDOMEN PELVIS W CONTRAST Result Date: 11/09/2024 EXAM: CT ABDOMEN AND PELVIS WITH CONTRAST 11/09/2024 04:23:11 AM TECHNIQUE: CT of the abdomen and pelvis was performed with the administration of 100 mL of iohexol  (OMNIPAQUE ) 300 MG/ML solution. Multiplanar reformatted images are provided for review. Automated exposure control, iterative reconstruction, and/or weight-based adjustment of the mA/kV was utilized to reduce the radiation dose to as low as reasonably achievable. COMPARISON: CT with IV contrast 08/23/2024 and 10/04/2024. CLINICAL HISTORY: Bowel obstruction suspected. Recent history of small bowel obstruction and volvulus. There is increasing tightness and distention of the patient's abdomen. Exploratory laparotomy  was done 09/29/2024 with a partial colectomy. FINDINGS: LOWER CHEST: There is mild cardiomegaly, with a small chronic pericardial effusion. Cylindrical bronchiectasis with scattered bronchial impactions again noted in the posteromedial basal left lower lobe and probably reflects changes due to chronic aspiration. The lung bases are otherwise clear. 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 Small hiatal hernia. LIVER: The liver is unremarkable. GALLBLADDER AND BILE DUCTS:  Single subcentimeter stone noted in the gallbladder without wall thickening or biliary dilatation. SPLEEN: No acute abnormality. PANCREAS: No acute abnormality. ADRENAL GLANDS: There is no adrenal mass. KIDNEYS, URETERS AND BLADDER: No renal mass. Asymmetric atrophy, scarring, and cysts of the left kidney is again noted. No follow-up imaging is recommended. No stones in the kidneys or ureters. No hydronephrosis. No perinephric or periureteral stranding. Urinary bladder is unremarkable. GI AND BOWEL: It is difficult to trace the anatomy of the patient's bowel resections given the lack of enteric contrast, presence of ascites and limited peritoneal body fat. Undigested medication tablets are again scattered throughout the bowel. As best I can tell, she probably had a right hemicolectomy at least to the mid transverse colon with a primary anastomosis which is probably in the upper central pelvis. The stomach is dilated with fluid and air as on the last CT. There is small bowel dilation in the upper, mid and lower abdomen up to 3.7 cm with moderate retained stool in the visualized colon. Multiple posterior pelvic small bowel loops are completely collapsed, consistent with high-grade obstruction but I could not identify the site of obstruction. At this point, the etiology could be an internal hernia or adhesive disease. Colonic segments in the left mid and lower abdomen are distended with stool, which was also seen  previously. There is no bowel pneumatosis or portal venous gas. PERITONEUM AND RETROPERITONEUM: There is mild free ascites. No free air. No free hemorrhage is seen. No abdominal wall hernia is seen. VASCULATURE: Aorta is normal in caliber. There is patchy aortoiliac atherosclerosis without aneurysm. LYMPH NODES: No lymphadenopathy. REPRODUCTIVE ORGANS: No acute abnormality. BONES AND SOFT TISSUES: There are degenerative changes and mild levoscoliosis of the thoracic spine with osteopenia. No acute or significant osseous abnormality. No focal soft tissue abnormality. IMPRESSION: 1. High-grade small bowel obstruction with dilated stomach and small bowel loops up to 3.7 cm, and collapsed posterior pelvic small bowel loops, with the transition point not identified; etiology could be internal hernia or adhesive disease; no bowel pneumatosis or portal venous gas. 2. Surgical changes noted to both small and large bowel with indigested medication tablets scattered throughout the bowel 3.  No free air, bowel pneumatosis  or free hemorrhage. 4. Mild free ascites. 5. Left lower lobe bronchiectasis and mucus plugging , most likely indicating chronic aspiration. 6. Dilated esophagus compatible with chronic dysmotility. . . Electronically signed by: Francis Quam MD 11/09/2024 05:14 AM EST RP Workstation: [id number]   DG Abd 2 Views Result Date: 10/25/2024 CLINICAL DATA:  Small bowel obstruction.  Ileus. EXAM: ABDOMEN - 2 VIEW COMPARISON:  10/23/2024 FINDINGS: Upright film shows no evidence for intraperitoneal free air. NG tube tip is in the stomach. Proximal side port projects in the region of the GE junction on the upright film but is below the GE junction on the lateral projection. Dilated small bowel loops in the right lower quadrant are opacified with enteric contrast. There is contrast material visible in the left colon and rectum. IMPRESSION: Dilated small bowel loops in the right lower quadrant are opacified with  enteric contrast. There is contrast material visible in the left colon and rectum. Electronically Signed   By: Camellia Candle M.D.   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 Patient presented to the ED on  10/23/2024 with worsening abdominal distension and discomfort. NGT placed for gastric decompression. Request for UGI to evaluate for possible gastric outlet obstruction versus malrotation. EXAM: DG UGI W SINGLE CM TECHNIQUE: Single contrast examination was then performed using 200 mL of Omnipaque  300. This exam was performed by Kahi Mohala PA-C, and was supervised and interpreted by Dr. Harrietta Sherry. FLUOROSCOPY: Radiation Exposure Index (as provided by the fluoroscopic device): 22.7 mGy Kerma COMPARISON:  CT ABDOMEN PELVIS W CONTRAST-10/23/2024 FINDINGS: Evaluation is slightly limited due to limited positioning secondary to patient condition. Esophagus:  Nasogastric tube in place. Gastroesophageal reflux:  None visualized. Stomach: Normal appearance. No hiatal hernia identified. Gastric emptying: Unremarkable. Duodenum: Unremarkable. No definite evidence of malrotation identified on this exam. Other:  Persistent gas-filled loops of bowel are again noted. IMPRESSION: 1. Unremarkable upper GI exam. 2. Persistent gas-filled dilated loops of bowel, suggesting ileus or obstruction. Electronically Signed   By: Harrietta Sherry M.D.   On: 10/24/2024 12:14   DG Abd Portable 1 View Result Date: 10/23/2024 EXAM: 1 VIEW XRAY OF THE ABDOMEN 10/23/2024 03:23:02 PM COMPARISON: 19 days ago. CLINICAL HISTORY: Confirm NG tube placement. FINDINGS: LINES, TUBES AND DEVICES: Distal tip of the nasogastric tube is seen in proximal stomach. BOWEL: Nonobstructive bowel gas pattern. SOFT TISSUES: No abnormal calcifications.  BONES: No acute fracture. IMPRESSION: 1. Distal tip of the nasogastric tube is in the proximal stomach. Electronically signed by: Lynwood Seip MD 10/23/2024 03:42 PM EST RP Workstation: [id number]   CT ABDOMEN PELVIS W CONTRAST Result Date: 10/23/2024 EXAM: CT ABDOMEN AND PELVIS WITH CONTRAST 10/23/2024 01:00:37 PM TECHNIQUE: CT of the abdomen and pelvis was performed with the administration of 100 mL iohexol  (OMNIPAQUE ) 300 MG/ML solution. Multiplanar reformatted images are provided for review. Automated exposure control, iterative reconstruction, and/or weight-based adjustment of the mA/kV was utilized to reduce the radiation dose to as low as reasonably achievable. COMPARISON: 19 days ago. CLINICAL HISTORY: Bowel obstruction suspected. FINDINGS: LOWER CHEST: Possible aspirated material seen in left lower lobe bronchi. LIVER: The liver is unremarkable. GALLBLADDER AND BILE DUCTS: Gallbladder is unremarkable. No biliary ductal dilatation. SPLEEN: No acute abnormality. PANCREAS: No acute abnormality. ADRENAL GLANDS: No acute abnormality. KIDNEYS, URETERS AND BLADDER: Left renal atrophy is noted with multiple cysts and scarring. No stones in the kidneys or ureters. No hydronephrosis. No perinephric or periureteral stranding. Urinary bladder is unremarkable. GI AND BOWEL: Moderately dilated esophagus is noted. Moderate gastric distention. Mildly dilated large and small bowel loops are noted suggesting possible ileus or obstruction. Postsurgical changes are seen involving small bowel loops. Stool is seen in multiple large bowel loops. There may be some fecalization seen within small bowel loops suggesting obstruction. Possibility of some degree of malrotation cannot be excluded. PERITONEUM AND RETROPERITONEUM: No ascites. No free air. VASCULATURE: Aorta is normal in caliber. Aortic atherosclerosis. LYMPH NODES: No lymphadenopathy. REPRODUCTIVE ORGANS: No acute abnormality. BONES AND SOFT TISSUES: No acute osseous  abnormality. No focal soft tissue abnormality. IMPRESSION: 1. Mildly dilated large and small bowel loops with possible fecalization, suggesting ileus or obstruction. 2. Surgical changes involving small bowel loops. 3. Possible malrotation. 4. Possible aspirated material seen in left lower lobe bronchi. Electronically signed by: Lynwood Seip MD 10/23/2024 01:42 PM EST RP Workstation: [id number]    Microbiology Recent Results (from the past 240 hours)  MRSA Next Gen by PCR, Nasal     Status: None   Collection Time: 11/16/24  9:51 PM   Specimen: Nasal Mucosa; Nasal Swab  Result Value Ref Range  Status   MRSA by PCR Next Gen NOT DETECTED NOT DETECTED Final    Comment: (NOTE) The GeneXpert MRSA Assay (FDA approved for NASAL specimens only), is one component of a comprehensive MRSA colonization surveillance program. It is not intended to diagnose MRSA infection nor to guide or monitor treatment for MRSA infections. Test performance is not FDA approved in patients less than 66 years old. Performed at [facility], [address] [place], [place] [address]     Lab Basic Metabolic Panel: Recent Labs  Lab 11/14/24 [phone] 11/16/24 0212 11/16/24 0934 11/21/2024 0337 11-21-2024 1004  NA 147* 142 145 141 140  K 3.6 5.2* 5.1 4.7 4.7  CL 112* 108 106 99 99  CO2 25 12* 18* 23 23  GLUCOSE 130* 208* 140* 124* 117*  BUN 29* 27* 32* 51* 51*  CREATININE 0.74 1.28* 1.72* 2.94* 3.13*  CALCIUM  7.7* 7.1* 8.0* 6.1* 6.4*  MG  --  2.6*  --  2.2  --   PHOS  --  10.2*  --  7.0*  --    Liver Function Tests: Recent Labs  Lab 11/16/24 0212 11/21/2024 0337 11/21/24 1004  AST >7,000* >7,000* >7,000*  ALT 6,763* 4,349* 2,566*  ALKPHOS 75 113 105  BILITOT 0.7 1.1 1.2  PROT 3.6* 3.2* 3.2*  ALBUMIN  2.2* 2.1* 2.4*   No results for input(s): LIPASE, AMYLASE in the last 168 hours. No results for input(s): AMMONIA in the last 168 hours. CBC: Recent Labs  Lab 11/14/24 0442 11/15/24 0811  11/15/24 1921 11/16/24 0212 11/16/24 1508 11/21/24 0337 11/21/2024 1004  WBC 6.9  --   --  5.2 8.9 19.8* 18.5*  NEUTROABS 5.5  --   --  3.5  --   --   --   HGB 7.9*   < > 8.4* 9.7* 8.4* 7.7* 5.8*  HCT 24.6*  --  27.0* 30.8* 24.8* 22.5* 17.3*  MCV 97.6  --   --  99.7 92.2 92.6 94.0  PLT 345  --   --  135* 122* 127* 102*   < > = values in this interval not displayed.   Cardiac Enzymes: No results for input(s): CKTOTAL, CKMB, CKMBINDEX, TROPONINI in the last 168 hours. Sepsis Labs: Recent Labs  Lab 11/16/24 0212 11/16/24 0704 11/16/24 1508 11/16/24 1746 11/16/24 1951 2024-11-21 0337 11/21/2024 1004  WBC 5.2  --  8.9  --   --  19.8* 18.5*  LATICACIDVEN >9.0*   < > 7.2* 7.9* 7.6* 7.2*  --    < > = values in this interval not displayed.    Jean-Pierre Yaquelin Langelier 11-21-24, 4:26 PM   "

## 2024-12-15 NOTE — Anesthesia Postprocedure Evaluation (Signed)
"   Anesthesia Post Note  Patient: Anna Castaneda  Procedure(s) Performed: LAPAROTOMY, EXPLORATORY EXCISION, SMALL INTESTINE  Patient location during evaluation: SICU Anesthesia Type: General Anesthetic complications: no Comments: Patient was made comfort care by the family and passed away.   There were no known notable events for this encounter.   Last Vitals:  Vitals:   12-14-2024 1445 December 14, 2024 1500  BP:    Pulse:    Resp: (!) 24 (!) 24  Temp: (!) 35.8 C (!) 35.9 C  SpO2:      Last Pain:  Vitals:   2024/12/14 1411  TempSrc: Esophageal  PainSc:                  Prentice Murphy      "

## 2024-12-15 NOTE — Anesthesia Postprocedure Evaluation (Signed)
"   Anesthesia Post Note  Patient: Casady Voshell  Procedure(s) Performed: LAPAROTOMY, EXPLORATORY, small bowel resection  Patient location during evaluation: SICU Anesthesia Type: General Level of consciousness: sedated Pain management: pain level controlled Vital Signs Assessment: post-procedure vital signs reviewed and stable Respiratory status: patient remains intubated per anesthesia plan Cardiovascular status: stable (on norepi and vasopressin  gtts) Postop Assessment: no apparent nausea or vomiting Anesthetic complications: no   No notable events documented.   Last Vitals:  Vitals:   28-Nov-2024 1445 November 28, 2024 1500  BP:    Pulse:    Resp: (!) 24 (!) 24  Temp: (!) 35.8 C (!) 35.9 C  SpO2:      Last Pain:  Vitals:   11/28/2024 1411  TempSrc: Esophageal  PainSc:                  Prentice Murphy      "

## 2024-12-15 NOTE — Progress Notes (Signed)
 Patient passed away 1610. Niece, Lonell, at bedside. MD notified. AC aware. Patient sent to morgue.

## 2024-12-15 NOTE — Progress Notes (Signed)
" °   12/13/24 1600  Spiritual Encounters  Type of Visit Initial  Care provided to: Family  Referral source Nurse (RN/NT/LPN)  Reason for visit End-of-life  OnCall Visit Yes  Spiritual Framework  Presenting Themes Caregiving needs  Interventions  Spiritual Care Interventions Made Prayer;Bereavement/grief support   Chaplain provided support to family member as she walked through decision to withdraw life-support of loved one. "

## 2024-12-15 NOTE — Plan of Care (Signed)

## 2024-12-15 NOTE — Consult Note (Signed)
 "                                   Consultation Note Date: 09-Anna Castaneda-2026   Patient Name: Anna Castaneda  DOB: July 27, 1946  MRN: [id number]  Age / Sex: 79 y.o., female  PCP: Sadie Manna, MD Referring Physician: Malka Domino, MD  Reason for Consultation: Establishing goals of care   HPI/Brief Hospital Course: 79 y.o. 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    Admitted and treated initially for bowel obstruction with NGT decompression To OR on 12/28 for large bowel obstruction s/p partial colectomy--progressing well 1/2 had an abrupt change in clinical status--severe pain, hypotension and tachycardia, transferred to ICU to initiate vasopressors, back to OR for exploratory laparotomy Return to OR 1/4 265 cm of small intestine resected due to necrosis Development of circulatory shock, respiratory failure, acute liver failure and acute kidney injury--concern for need for CRRT, on multiple vasopressors and TPN General surgery with plans to go back to OR in 24-48 hours to again assess for bowel ischemia  Palliative medicine was consulted for assisting with goals of care conversations.  Subjective:  Extensive chart review has been completed prior to meeting patient including labs, vital signs, imaging, progress notes, orders, and available advanced directive documents from current and previous encounters.  Visited with Anna Castaneda at her bedside. She remains intubated and sedated. Niece--[place] present at bedside. Conversation continued with Anna Castaneda in family conference room.  Introduced myself as a publishing rights manager as a member of the palliative care team. Explained palliative medicine is specialized medical  care for people living with serious illness. It focuses on providing relief from the symptoms and stress of a serious illness. The goal is to improve quality of life for both the patient and the family.   Anna Cast[place]eda shares a brief life review for Anna Cast[place]. Anna Castaneda is widowed, married later in life, did not have biological children, had a step son from her marriage that she is not close with. From reviewing document in [place], Anna Castaneda has been appointed [employer]. Anna Castaneda shares she has sister as well as a cousin--three nieces of [place]. Anna Cast[place]eda has been closest with [place]--they traveled together frequently.  Anna Castaneda shares Anna Cast[place] worked for the [place] for many years prior to retirement. She moved to [place] to live within [facility] community when she became widowed. She lives within the independent living part of [place]. Anna Castaneda describes Anna Castaneda as a very active individual, walks several miles per day. She remained completely independent and enjoyed spending her time traveling. She has multiple trips lined up for the new year.  Anna Castaneda recently spoke with the general surgeon and was provided updates. Anna Castaneda is able to explain her understanding of Anna Castaneda's current medical condition. Aware of significant critical illness related to bowel ischemia. Aware of removal of significant amount of small intestine--concern for more areas of ischemia with need to return to OR soon. We also discussed need for consideration of CRRT due to AKI.  We discussed patient's current illness and what it means in the larger context of patient's on-going co-morbidities. Natural disease trajectory and expectations at EOL were discussed.   [place] Cast[place]eda has reviewed [place]'s living will and  has been in conversation with her sister and cousin. All family feel as though Anna Castaneda would not want to continue with aggressive measures at this time given the severity of her condition.  The difference between aggressive medical intervention and comfort care was  discussed.  We discussed comfort care in detail, Anna Castaneda would no longer receive aggressive medical interventions such as continuous vital signs, lab work, radiology testing, or medications not focused on comfort. She would be liberated from mechanical ventilation and weaned from vasopressor support. All care would focus on how the patient is looking and feeling. This would include management of any symptoms that may cause discomfort, pain, shortness of breath, cough, nausea, agitation, anxiety, and/or secretions etc. Symptoms would be managed with medications and other non-pharmacological interventions such as spiritual support if requested, repositioning, music therapy, or therapeutic listening. Family verbalized understanding and appreciation.   We also discussed code status and the difference between Full Code and Do Not Resuscitate. Encouraged Anna Castaneda to consider DNR/DNI status understanding evidenced based poor outcomes in similar hospitalized patients, as the cause of the arrest is likely associated with chronic/terminal disease rather than a reversible acute cardio-pulmonary event.  Anna Castaneda verbalizes understanding and wishes to transition to DNR.  Anna Castaneda would like an opportunity to speak with her sister and cousin prior to transition to CMO.  Returned to bedside, Anna Castaneda shares she had an opportunity to speak with her family and all are in agreement with transition to CMO.  Orders placed to transition to comfort measures.   I discussed importance of continued conversations with family/support persons and all members of their medical team regarding overall plan of care and treatment options ensuring decisions are in alignment with patients goals of care.  All questions/concerns addressed. Emotional support provided to patient/family/support persons. PMT will continue to follow and support patient as needed.  Objective: Primary Diagnoses: Present on Admission:  SBO (small bowel  obstruction) (HCC)   Physical Exam Constitutional:      Appearance: She is ill-appearing.     Interventions: She is sedated and intubated.  HENT:     Mouth/Throat:     Mouth: Mucous membranes are dry.  Cardiovascular:     Rate and Rhythm: Normal rate and regular rhythm.  Pulmonary:     Effort: Pulmonary effort is normal. No respiratory distress. She is intubated.  Abdominal:     Comments: Wound vac in place to mid abdomen  Skin:    General: Skin is warm.     Coloration: Skin is pale.     Vital Signs: BP (!) 154/68   Pulse 67   Temp (!) 96.6 F (35.9 C)   Resp (!) 24   Ht 5' 4.02 (1.626 m)   Wt 49.3 kg   SpO2 100%   BMI 18.65 kg/m  Pain Scale: CPOT POSS *See Group Information*: 1-Acceptable,Awake and alert Pain Score: 0-No pain  IO: Intake/output summary:  Intake/Output Summary (Last 24 hours) at 2024-12-03 1541 Last data filed at 12/03/2024 1503 Gross per 24 hour  Intake 6983.33 ml  Output 1057 ml  Net 5926.33 ml    LBM: Last BM Date : [place] 20, 2026 Baseline Weight: Weight: 53.3 kg Most recent weight: Weight: 49.3 kg      Assessment and Plan  SUMMARY OF RECOMMENDATIONS   DNR Transitioned to CMO--appropriate orders placed Anticipate hospital passing  Palliative Prophylaxis:   Bowel Regimen, Delirium Protocol and Frequent Pain Assessment  Discussed With: CCM team and nursing staff.   Thank you for this  consult and allowing Palliative Medicine to participate in the care of Esmirna Ravan Anna Castaneda. Palliative medicine will continue to follow and assist as needed.   Time spent includes: Detailed review of medical records (labs, imaging, vital signs), medically appropriate exam (mental status, respiratory, cardiac, skin), discussed with treatment team, counseling and educating patient, family and staff, documenting clinical information, medication management and coordination of care.   Signed by: Waddell Lesches, DNP, AGNP-C Palliative Medicine    Please contact  Palliative Medicine Team phone at [phone] for questions and concerns.  For individual provider: See Amion   "

## 2024-12-15 NOTE — Anesthesia Preprocedure Evaluation (Signed)
"                                    Anesthesia Evaluation  Patient identified by MRN, date of birth, ID band Patient awake    Reviewed: Allergy & Precautions, H&P , NPO status , Patient's Chart, lab work & pertinent test results, reviewed documented beta blocker date and time   Airway Mallampati: III  TM Distance: >3 FB Neck ROM: full    Dental  (+) Caps, Dental Advidsory Given, Teeth Intact, Missing   Pulmonary neg pulmonary ROS, former smoker   Pulmonary exam normal breath sounds clear to auscultation       Cardiovascular Exercise Tolerance: Good hypertension, (-) angina + Past MI (broken heart syndrome)  (-) Cardiac Stents Normal cardiovascular exam(-) dysrhythmias (-) Valvular Problems/Murmurs Rhythm:regular Rate:Normal     Neuro/Psych Seizures - (one time), Well Controlled,   negative psych ROS   GI/Hepatic Neg liver ROS,GERD  Controlled and Medicated,,  Endo/Other  negative endocrine ROS    Renal/GU      Musculoskeletal   Abdominal   Peds  Hematology negative hematology ROS (+)   Anesthesia Other Findings Patient is returning to the OR today for re-exploration after her surgery on 11/15/24.  She continues to be on norepi and vaso gtts and currently intubated.  Past Medical History: 2018: Arthritis 2005: GERD (gastroesophageal reflux disease) No date: Hyperlipidemia No date: Hypertension 02/01/2016: Myocardial infarction [facility])     Comment:  Broken Heart Syndrome 2018: Osteoporosis   Reproductive/Obstetrics negative OB ROS                              Anesthesia Physical Anesthesia Plan  ASA: 4  Anesthesia Plan: General   Post-op Pain Management:    Induction: Intravenous, Rapid sequence and Cricoid pressure planned  PONV Risk Score and Plan: 3 and Ondansetron  and Dexamethasone   Airway Management Planned: Oral ETT  Additional Equipment:   Intra-op Plan:   Post-operative Plan: Post-operative  intubation/ventilation  Informed Consent: I have reviewed the patients History and Physical, chart, labs and discussed the procedure including the risks, benefits and alternatives for the proposed anesthesia with the patient or authorized representative who has indicated his/her understanding and acceptance.     Dental Advisory Given  Plan Discussed with: Anesthesiologist, CRNA and Surgeon  Anesthesia Plan Comments: (Patient consented for risks of anesthesia including but not limited to:  - adverse reactions to medications - damage to eyes, teeth, lips or other oral mucosa - nerve damage due to positioning  - sore throat or hoarseness - Damage to heart, brain, nerves, lungs, other parts of body or loss of life  Patient voiced understanding and assent.)         Anesthesia Quick Evaluation  "

## 2024-12-15 NOTE — Transfer of Care (Signed)
 Immediate Anesthesia Transfer of Care Note  Patient: Anna Castaneda  Procedure(s) Performed: LAPAROTOMY, EXPLORATORY EXCISION, SMALL INTESTINE  Patient Location: ICU  Anesthesia Type:General  Level of Consciousness: drowsy and patient cooperative  Airway & Oxygen Therapy: Patient remains intubated per anesthesia plan  Post-op Assessment: Report given to RN and Post -op Vital signs reviewed and stable  Post vital signs: stable  Last Vitals:  Vitals Value Taken Time  BP    Temp 35.4 C Dec 04, 2024 09:26  Pulse    Resp 24 12-04-2024 09:26  SpO2    Vitals shown include unfiled device data.  Last Pain:  Vitals:   11/16/24 1200  TempSrc: Esophageal  PainSc:       Patients Stated Pain Goal: 0 (11/14/24 2044)  Complications: No notable events documented.

## 2024-12-15 DEATH — deceased
